# Patient Record
Sex: Female | Born: 1935 | Race: White | Hispanic: No | Marital: Married | Smoking: Former smoker
Health system: Southern US, Community
[De-identification: ages and names within clinical notes are randomized; demographics above are authoritative.]

## PROBLEM LIST (undated history)

## (undated) DIAGNOSIS — G35 Multiple sclerosis: Secondary | ICD-10-CM

## (undated) DIAGNOSIS — M549 Dorsalgia, unspecified: Secondary | ICD-10-CM

## (undated) DIAGNOSIS — H269 Unspecified cataract: Secondary | ICD-10-CM

## (undated) DIAGNOSIS — K649 Unspecified hemorrhoids: Secondary | ICD-10-CM

## (undated) DIAGNOSIS — G8929 Other chronic pain: Secondary | ICD-10-CM

## (undated) DIAGNOSIS — M2540 Effusion, unspecified joint: Secondary | ICD-10-CM

## (undated) DIAGNOSIS — Z9889 Other specified postprocedural states: Secondary | ICD-10-CM

## (undated) DIAGNOSIS — R233 Spontaneous ecchymoses: Secondary | ICD-10-CM

## (undated) DIAGNOSIS — J449 Chronic obstructive pulmonary disease, unspecified: Secondary | ICD-10-CM

## (undated) DIAGNOSIS — Z8619 Personal history of other infectious and parasitic diseases: Secondary | ICD-10-CM

## (undated) DIAGNOSIS — Z8739 Personal history of other diseases of the musculoskeletal system and connective tissue: Secondary | ICD-10-CM

## (undated) DIAGNOSIS — Z86010 Personal history of colonic polyps: Secondary | ICD-10-CM

## (undated) DIAGNOSIS — R0602 Shortness of breath: Secondary | ICD-10-CM

## (undated) DIAGNOSIS — E785 Hyperlipidemia, unspecified: Secondary | ICD-10-CM

## (undated) DIAGNOSIS — M069 Rheumatoid arthritis, unspecified: Secondary | ICD-10-CM

## (undated) DIAGNOSIS — I10 Essential (primary) hypertension: Secondary | ICD-10-CM

## (undated) DIAGNOSIS — R609 Edema, unspecified: Secondary | ICD-10-CM

## (undated) DIAGNOSIS — M2550 Pain in unspecified joint: Secondary | ICD-10-CM

## (undated) DIAGNOSIS — R600 Localized edema: Secondary | ICD-10-CM

## (undated) DIAGNOSIS — J441 Chronic obstructive pulmonary disease with (acute) exacerbation: Secondary | ICD-10-CM

## (undated) DIAGNOSIS — R238 Other skin changes: Secondary | ICD-10-CM

## (undated) DIAGNOSIS — I872 Venous insufficiency (chronic) (peripheral): Secondary | ICD-10-CM

## (undated) DIAGNOSIS — R112 Nausea with vomiting, unspecified: Secondary | ICD-10-CM

## (undated) DIAGNOSIS — F419 Anxiety disorder, unspecified: Secondary | ICD-10-CM

## (undated) DIAGNOSIS — I350 Nonrheumatic aortic (valve) stenosis: Secondary | ICD-10-CM

## (undated) DIAGNOSIS — K219 Gastro-esophageal reflux disease without esophagitis: Secondary | ICD-10-CM

## (undated) DIAGNOSIS — J189 Pneumonia, unspecified organism: Secondary | ICD-10-CM

## (undated) DIAGNOSIS — Z8709 Personal history of other diseases of the respiratory system: Secondary | ICD-10-CM

## (undated) DIAGNOSIS — I480 Paroxysmal atrial fibrillation: Secondary | ICD-10-CM

## (undated) HISTORY — DX: Rheumatoid arthritis, unspecified: M06.9

## (undated) HISTORY — PX: OTHER SURGICAL HISTORY: SHX169

## (undated) HISTORY — DX: Multiple sclerosis: G35

## (undated) HISTORY — PX: COLONOSCOPY: SHX174

## (undated) HISTORY — DX: Nonrheumatic aortic (valve) stenosis: I35.0

## (undated) HISTORY — DX: Essential (primary) hypertension: I10

## (undated) HISTORY — PX: BREAST CYST EXCISION: SHX579

## (undated) HISTORY — DX: Gastro-esophageal reflux disease without esophagitis: K21.9

## (undated) HISTORY — PX: ABDOMINAL HYSTERECTOMY: SHX81

## (undated) HISTORY — DX: Chronic obstructive pulmonary disease, unspecified: J44.9

## (undated) HISTORY — DX: Chronic obstructive pulmonary disease with (acute) exacerbation: J44.1

## (undated) HISTORY — DX: Venous insufficiency (chronic) (peripheral): I87.2

## (undated) HISTORY — DX: Paroxysmal atrial fibrillation: I48.0

---

## 1999-09-18 ENCOUNTER — Encounter (INDEPENDENT_AMBULATORY_CARE_PROVIDER_SITE_OTHER): Payer: Self-pay | Admitting: *Deleted

## 1999-09-18 ENCOUNTER — Ambulatory Visit (HOSPITAL_COMMUNITY): Admission: RE | Admit: 1999-09-18 | Discharge: 1999-09-18 | Payer: Self-pay | Admitting: Gastroenterology

## 2001-09-21 HISTORY — PX: LUMBAR DISC SURGERY: SHX700

## 2002-09-13 ENCOUNTER — Encounter: Payer: Self-pay | Admitting: Neurosurgery

## 2002-09-13 ENCOUNTER — Ambulatory Visit (HOSPITAL_COMMUNITY): Admission: RE | Admit: 2002-09-13 | Discharge: 2002-09-13 | Payer: Self-pay | Admitting: Neurosurgery

## 2002-09-16 ENCOUNTER — Inpatient Hospital Stay (HOSPITAL_COMMUNITY): Admission: AD | Admit: 2002-09-16 | Discharge: 2002-09-17 | Payer: Self-pay | Admitting: Neurosurgery

## 2002-09-16 ENCOUNTER — Encounter: Payer: Self-pay | Admitting: Neurosurgery

## 2003-04-18 ENCOUNTER — Ambulatory Visit (HOSPITAL_COMMUNITY): Admission: RE | Admit: 2003-04-18 | Discharge: 2003-04-18 | Payer: Self-pay | Admitting: Gastroenterology

## 2005-06-10 ENCOUNTER — Ambulatory Visit (HOSPITAL_COMMUNITY): Admission: RE | Admit: 2005-06-10 | Discharge: 2005-06-10 | Payer: Self-pay | Admitting: Neurosurgery

## 2005-12-02 ENCOUNTER — Ambulatory Visit (HOSPITAL_COMMUNITY): Admission: RE | Admit: 2005-12-02 | Discharge: 2005-12-02 | Payer: Self-pay | Admitting: Family Medicine

## 2005-12-07 ENCOUNTER — Ambulatory Visit: Payer: Self-pay | Admitting: Family Medicine

## 2005-12-07 ENCOUNTER — Inpatient Hospital Stay (HOSPITAL_COMMUNITY): Admission: AD | Admit: 2005-12-07 | Discharge: 2005-12-09 | Payer: Self-pay | Admitting: Family Medicine

## 2006-10-25 ENCOUNTER — Ambulatory Visit: Payer: Self-pay | Admitting: Internal Medicine

## 2006-11-22 ENCOUNTER — Ambulatory Visit (HOSPITAL_COMMUNITY): Admission: RE | Admit: 2006-11-22 | Discharge: 2006-11-22 | Payer: Self-pay | Admitting: Family Medicine

## 2006-11-25 ENCOUNTER — Ambulatory Visit: Payer: Self-pay | Admitting: Internal Medicine

## 2006-11-26 ENCOUNTER — Encounter: Admission: RE | Admit: 2006-11-26 | Discharge: 2006-11-26 | Payer: Self-pay | Admitting: Family Medicine

## 2007-03-31 ENCOUNTER — Ambulatory Visit: Payer: Self-pay | Admitting: Internal Medicine

## 2007-04-01 ENCOUNTER — Ambulatory Visit: Payer: Self-pay

## 2007-05-03 ENCOUNTER — Ambulatory Visit: Payer: Self-pay | Admitting: Internal Medicine

## 2007-05-03 LAB — CONVERTED CEMR LAB
Basophils Relative: 0.8 % (ref 0.0–1.0)
Eosinophils Absolute: 0.3 10*3/uL (ref 0.0–0.6)
Hemoglobin: 13.8 g/dL (ref 12.0–15.0)
MCHC: 35.3 g/dL (ref 30.0–36.0)
Monocytes Relative: 9.1 % (ref 3.0–11.0)
Neutro Abs: 4.8 10*3/uL (ref 1.4–7.7)
Neutrophils Relative %: 58.3 % (ref 43.0–77.0)
Platelets: 232 10*3/uL (ref 150–400)
RDW: 13.3 % (ref 11.5–14.6)

## 2007-05-04 ENCOUNTER — Encounter: Payer: Self-pay | Admitting: Internal Medicine

## 2007-05-04 LAB — CONVERTED CEMR LAB: Theophylline Lvl: 10.2 ug/mL (ref 10.0–20.0)

## 2007-05-05 ENCOUNTER — Ambulatory Visit: Payer: Self-pay | Admitting: Internal Medicine

## 2007-06-03 ENCOUNTER — Ambulatory Visit: Payer: Self-pay | Admitting: Vascular Surgery

## 2007-07-01 DIAGNOSIS — J4489 Other specified chronic obstructive pulmonary disease: Secondary | ICD-10-CM | POA: Insufficient documentation

## 2007-07-01 DIAGNOSIS — J449 Chronic obstructive pulmonary disease, unspecified: Secondary | ICD-10-CM

## 2007-07-01 DIAGNOSIS — G35 Multiple sclerosis: Secondary | ICD-10-CM

## 2007-07-01 DIAGNOSIS — I8310 Varicose veins of unspecified lower extremity with inflammation: Secondary | ICD-10-CM | POA: Insufficient documentation

## 2007-07-23 DIAGNOSIS — I480 Paroxysmal atrial fibrillation: Secondary | ICD-10-CM

## 2007-07-23 HISTORY — DX: Paroxysmal atrial fibrillation: I48.0

## 2007-07-29 ENCOUNTER — Ambulatory Visit: Payer: Self-pay | Admitting: Internal Medicine

## 2007-07-29 ENCOUNTER — Inpatient Hospital Stay (HOSPITAL_COMMUNITY): Admission: EM | Admit: 2007-07-29 | Discharge: 2007-08-01 | Payer: Self-pay | Admitting: Emergency Medicine

## 2007-07-29 ENCOUNTER — Ambulatory Visit: Payer: Self-pay | Admitting: Pulmonary Disease

## 2007-07-29 ENCOUNTER — Ambulatory Visit: Payer: Self-pay | Admitting: Cardiology

## 2007-07-31 ENCOUNTER — Encounter: Payer: Self-pay | Admitting: Cardiology

## 2007-08-15 ENCOUNTER — Ambulatory Visit: Payer: Self-pay | Admitting: Cardiovascular Disease

## 2007-09-02 ENCOUNTER — Ambulatory Visit: Payer: Self-pay | Admitting: Internal Medicine

## 2007-09-19 ENCOUNTER — Telehealth: Payer: Self-pay | Admitting: Internal Medicine

## 2007-10-20 ENCOUNTER — Ambulatory Visit: Payer: Self-pay | Admitting: Emergency Medicine

## 2007-10-20 ENCOUNTER — Ambulatory Visit: Payer: Self-pay | Admitting: Internal Medicine

## 2007-10-20 ENCOUNTER — Inpatient Hospital Stay (HOSPITAL_COMMUNITY): Admission: EM | Admit: 2007-10-20 | Discharge: 2007-10-24 | Payer: Self-pay | Admitting: Internal Medicine

## 2007-10-20 DIAGNOSIS — J441 Chronic obstructive pulmonary disease with (acute) exacerbation: Secondary | ICD-10-CM

## 2007-11-10 ENCOUNTER — Telehealth (INDEPENDENT_AMBULATORY_CARE_PROVIDER_SITE_OTHER): Payer: Self-pay | Admitting: *Deleted

## 2007-11-10 ENCOUNTER — Ambulatory Visit: Payer: Self-pay | Admitting: Internal Medicine

## 2007-11-10 DIAGNOSIS — I10 Essential (primary) hypertension: Secondary | ICD-10-CM

## 2007-11-10 DIAGNOSIS — M069 Rheumatoid arthritis, unspecified: Secondary | ICD-10-CM | POA: Insufficient documentation

## 2007-11-11 ENCOUNTER — Ambulatory Visit: Payer: Self-pay | Admitting: Internal Medicine

## 2007-11-23 ENCOUNTER — Telehealth (INDEPENDENT_AMBULATORY_CARE_PROVIDER_SITE_OTHER): Payer: Self-pay | Admitting: *Deleted

## 2007-12-09 ENCOUNTER — Ambulatory Visit: Payer: Self-pay | Admitting: Internal Medicine

## 2007-12-09 DIAGNOSIS — J3089 Other allergic rhinitis: Secondary | ICD-10-CM

## 2007-12-09 DIAGNOSIS — J302 Other seasonal allergic rhinitis: Secondary | ICD-10-CM

## 2008-01-09 ENCOUNTER — Ambulatory Visit: Payer: Self-pay | Admitting: Internal Medicine

## 2008-02-29 ENCOUNTER — Ambulatory Visit: Payer: Self-pay | Admitting: Internal Medicine

## 2008-05-29 ENCOUNTER — Ambulatory Visit: Payer: Self-pay | Admitting: Cardiovascular Disease

## 2008-07-20 ENCOUNTER — Telehealth (INDEPENDENT_AMBULATORY_CARE_PROVIDER_SITE_OTHER): Payer: Self-pay | Admitting: *Deleted

## 2008-08-17 ENCOUNTER — Telehealth (INDEPENDENT_AMBULATORY_CARE_PROVIDER_SITE_OTHER): Payer: Self-pay | Admitting: *Deleted

## 2008-08-30 ENCOUNTER — Ambulatory Visit: Payer: Self-pay | Admitting: Internal Medicine

## 2008-09-18 ENCOUNTER — Telehealth: Payer: Self-pay | Admitting: Internal Medicine

## 2008-09-24 ENCOUNTER — Ambulatory Visit: Payer: Self-pay | Admitting: Internal Medicine

## 2008-09-25 ENCOUNTER — Telehealth (INDEPENDENT_AMBULATORY_CARE_PROVIDER_SITE_OTHER): Payer: Self-pay | Admitting: *Deleted

## 2008-09-27 ENCOUNTER — Telehealth (INDEPENDENT_AMBULATORY_CARE_PROVIDER_SITE_OTHER): Payer: Self-pay | Admitting: *Deleted

## 2008-09-27 ENCOUNTER — Encounter: Payer: Self-pay | Admitting: Internal Medicine

## 2008-10-01 ENCOUNTER — Encounter: Payer: Self-pay | Admitting: Internal Medicine

## 2008-10-02 ENCOUNTER — Telehealth: Payer: Self-pay | Admitting: Internal Medicine

## 2008-10-04 ENCOUNTER — Encounter: Payer: Self-pay | Admitting: Internal Medicine

## 2008-10-15 ENCOUNTER — Encounter: Payer: Self-pay | Admitting: Internal Medicine

## 2008-10-29 ENCOUNTER — Ambulatory Visit: Payer: Self-pay | Admitting: Internal Medicine

## 2008-12-28 ENCOUNTER — Encounter: Payer: Self-pay | Admitting: Internal Medicine

## 2009-02-07 ENCOUNTER — Ambulatory Visit: Payer: Self-pay | Admitting: Internal Medicine

## 2009-02-26 ENCOUNTER — Ambulatory Visit: Payer: Self-pay | Admitting: Internal Medicine

## 2009-03-21 ENCOUNTER — Ambulatory Visit: Payer: Self-pay | Admitting: Internal Medicine

## 2009-04-29 ENCOUNTER — Ambulatory Visit: Payer: Self-pay | Admitting: Internal Medicine

## 2009-05-02 ENCOUNTER — Telehealth: Payer: Self-pay | Admitting: Internal Medicine

## 2009-06-04 DIAGNOSIS — I4891 Unspecified atrial fibrillation: Secondary | ICD-10-CM

## 2009-06-04 DIAGNOSIS — L259 Unspecified contact dermatitis, unspecified cause: Secondary | ICD-10-CM

## 2009-06-04 DIAGNOSIS — F4321 Adjustment disorder with depressed mood: Secondary | ICD-10-CM | POA: Insufficient documentation

## 2009-06-04 DIAGNOSIS — I350 Nonrheumatic aortic (valve) stenosis: Secondary | ICD-10-CM

## 2009-06-04 DIAGNOSIS — K219 Gastro-esophageal reflux disease without esophagitis: Secondary | ICD-10-CM | POA: Insufficient documentation

## 2009-06-05 ENCOUNTER — Ambulatory Visit: Payer: Self-pay | Admitting: Cardiovascular Disease

## 2009-06-05 DIAGNOSIS — R0602 Shortness of breath: Secondary | ICD-10-CM | POA: Insufficient documentation

## 2009-06-07 ENCOUNTER — Ambulatory Visit: Payer: Self-pay | Admitting: Internal Medicine

## 2009-06-18 ENCOUNTER — Ambulatory Visit: Payer: Self-pay

## 2009-06-18 ENCOUNTER — Encounter: Payer: Self-pay | Admitting: Cardiovascular Disease

## 2009-08-27 ENCOUNTER — Ambulatory Visit: Payer: Self-pay | Admitting: Internal Medicine

## 2009-09-16 ENCOUNTER — Ambulatory Visit: Payer: Self-pay | Admitting: Internal Medicine

## 2009-09-17 ENCOUNTER — Telehealth: Payer: Self-pay | Admitting: Internal Medicine

## 2009-09-17 ENCOUNTER — Inpatient Hospital Stay (HOSPITAL_COMMUNITY): Admission: EM | Admit: 2009-09-17 | Discharge: 2009-09-23 | Payer: Self-pay | Admitting: Emergency Medicine

## 2009-09-17 ENCOUNTER — Ambulatory Visit: Payer: Self-pay | Admitting: Internal Medicine

## 2009-09-21 ENCOUNTER — Ambulatory Visit: Payer: Self-pay | Admitting: Internal Medicine

## 2009-09-30 ENCOUNTER — Telehealth (INDEPENDENT_AMBULATORY_CARE_PROVIDER_SITE_OTHER): Payer: Self-pay | Admitting: *Deleted

## 2009-10-22 ENCOUNTER — Ambulatory Visit: Payer: Self-pay | Admitting: Internal Medicine

## 2009-10-22 DIAGNOSIS — B009 Herpesviral infection, unspecified: Secondary | ICD-10-CM | POA: Insufficient documentation

## 2009-11-06 ENCOUNTER — Encounter: Payer: Self-pay | Admitting: Pulmonary Disease

## 2009-11-07 ENCOUNTER — Inpatient Hospital Stay (HOSPITAL_COMMUNITY): Admission: EM | Admit: 2009-11-07 | Discharge: 2009-11-08 | Payer: Self-pay | Admitting: Emergency Medicine

## 2009-11-08 ENCOUNTER — Encounter: Payer: Self-pay | Admitting: Internal Medicine

## 2009-11-25 ENCOUNTER — Ambulatory Visit: Payer: Self-pay | Admitting: Cardiovascular Disease

## 2009-12-04 ENCOUNTER — Ambulatory Visit: Payer: Self-pay | Admitting: Internal Medicine

## 2010-01-09 ENCOUNTER — Telehealth: Payer: Self-pay | Admitting: Internal Medicine

## 2010-01-20 ENCOUNTER — Ambulatory Visit: Payer: Self-pay | Admitting: Internal Medicine

## 2010-01-21 ENCOUNTER — Encounter: Admission: RE | Admit: 2010-01-21 | Discharge: 2010-01-21 | Payer: Self-pay | Admitting: Internal Medicine

## 2010-03-31 ENCOUNTER — Telehealth (INDEPENDENT_AMBULATORY_CARE_PROVIDER_SITE_OTHER): Payer: Self-pay | Admitting: *Deleted

## 2010-03-31 ENCOUNTER — Ambulatory Visit: Payer: Self-pay | Admitting: Internal Medicine

## 2010-04-04 ENCOUNTER — Inpatient Hospital Stay (HOSPITAL_COMMUNITY): Admission: EM | Admit: 2010-04-04 | Discharge: 2010-04-11 | Payer: Self-pay | Admitting: Emergency Medicine

## 2010-04-04 ENCOUNTER — Ambulatory Visit: Payer: Self-pay | Admitting: Internal Medicine

## 2010-04-11 ENCOUNTER — Telehealth (INDEPENDENT_AMBULATORY_CARE_PROVIDER_SITE_OTHER): Payer: Self-pay | Admitting: *Deleted

## 2010-04-21 ENCOUNTER — Ambulatory Visit: Payer: Self-pay | Admitting: Internal Medicine

## 2010-04-22 ENCOUNTER — Encounter: Payer: Self-pay | Admitting: Internal Medicine

## 2010-05-06 ENCOUNTER — Ambulatory Visit: Payer: Self-pay | Admitting: Internal Medicine

## 2010-05-06 ENCOUNTER — Telehealth (INDEPENDENT_AMBULATORY_CARE_PROVIDER_SITE_OTHER): Payer: Self-pay | Admitting: *Deleted

## 2010-05-20 ENCOUNTER — Telehealth (INDEPENDENT_AMBULATORY_CARE_PROVIDER_SITE_OTHER): Payer: Self-pay | Admitting: *Deleted

## 2010-05-23 ENCOUNTER — Ambulatory Visit: Payer: Self-pay | Admitting: Internal Medicine

## 2010-05-28 LAB — CONVERTED CEMR LAB: IgE (Immunoglobulin E), Serum: <1.5 intl units/mL (ref 0.0–180.0)

## 2010-06-13 ENCOUNTER — Telehealth (INDEPENDENT_AMBULATORY_CARE_PROVIDER_SITE_OTHER): Payer: Self-pay | Admitting: *Deleted

## 2010-06-26 ENCOUNTER — Telehealth (INDEPENDENT_AMBULATORY_CARE_PROVIDER_SITE_OTHER): Payer: Self-pay | Admitting: *Deleted

## 2010-06-27 ENCOUNTER — Encounter: Payer: Self-pay | Admitting: Internal Medicine

## 2010-06-27 ENCOUNTER — Ambulatory Visit: Payer: Self-pay | Admitting: Internal Medicine

## 2010-06-27 DIAGNOSIS — I498 Other specified cardiac arrhythmias: Secondary | ICD-10-CM | POA: Insufficient documentation

## 2010-07-22 ENCOUNTER — Ambulatory Visit: Payer: Self-pay | Admitting: Internal Medicine

## 2010-07-31 ENCOUNTER — Encounter: Payer: Self-pay | Admitting: Internal Medicine

## 2010-08-10 ENCOUNTER — Encounter: Payer: Self-pay | Admitting: Internal Medicine

## 2010-09-01 ENCOUNTER — Telehealth: Payer: Self-pay | Admitting: Internal Medicine

## 2010-09-29 ENCOUNTER — Telehealth (INDEPENDENT_AMBULATORY_CARE_PROVIDER_SITE_OTHER): Payer: Self-pay | Admitting: *Deleted

## 2010-09-29 ENCOUNTER — Ambulatory Visit
Admission: RE | Admit: 2010-09-29 | Discharge: 2010-09-29 | Payer: Self-pay | Source: Home / Self Care | Attending: Internal Medicine | Admitting: Internal Medicine

## 2010-10-06 ENCOUNTER — Telehealth (INDEPENDENT_AMBULATORY_CARE_PROVIDER_SITE_OTHER): Payer: Self-pay | Admitting: *Deleted

## 2010-10-20 ENCOUNTER — Ambulatory Visit
Admission: RE | Admit: 2010-10-20 | Discharge: 2010-10-20 | Payer: Self-pay | Source: Home / Self Care | Attending: Internal Medicine | Admitting: Internal Medicine

## 2010-10-21 NOTE — Assessment & Plan Note (Signed)
Summary: rov 3 months/// kp   Primary Provider/Referring Provider:  Duffy Rhody  CC:  3 month follow up visit-COPD; cough-non productive, slight wheezing, and SOB.Marland Kitchen  History of Present Illness:  May 23, 2010- Chronic obstructive asthma, PAF, RA Cough finally resolved as she finished antibiotic Levaquin from last visit. Now clear almost 2 weeks. Insurance wouldn't cover Daliresp. She isn't sure whether it was helping or not. We discussed it and she decided she wanted to self pay and try it another month.  June 27, 2010  Chronic obstructive asthma, PAF, RA cc: Acute Visit-SOB, dizziness, tightness in chest. Note tachycardia on arrival 129/min. We had called biaxin and prednisone for productive cough and finishing that she felt very well. Went to [employer] with sister. On return began feeling weak and aware of rapid pulse with no chest pain. Head stopped up lying in bed with oxygen last night. Denies any tightness, sweling or aching in legs.  Chest feels tight with no cough or phlegm. Denies blood, fever, chills, sore throat, chest pain, leg pain or swelling. EKG- sinus tach 109. Pulmonary disease pattern.  July 22, 2010- Chronic obstructive asthma, PAF, RA,  .Nurse CC: 3 month follow up visit-COPD; cough-non productive, slight wheezing, SOB. Feeling more wheeze in the last 2 days, attributed to the weather change. "Just feel bad". Pains along lateral aspect of right uper and lower leg limit walking when she first gets up. Had surgery before for similar nerve pain in the other leg. I asked her to contact Dr Marina Goodell for this, as her PCP.  She thinks Daliresp has helped some and asks more samples. Denies significant GI upset.   Asthma History    Asthma Control Assessment:    Age range: 12+ years    Symptoms: >2 days/week    Nighttime Awakenings: 0-2/month    Interferes w/ normal activity: some limitations    SABA use (not for EIB): >2 days/week    Asthma Control Assessment: Not  Well Controlled   Preventive Screening-Counseling & Management  Alcohol-Tobacco     Smoking Status: quit > 6 months     Smoking Cessation Counseling: yes     Packs/Day: 2.0     Year Started: 1956     Year Quit: 1991     Pack years: 30 yrs  1 pack daily     Tobacco Counseling: not to resume use of tobacco products  Current Medications (verified): 1)  Advair Diskus 250-50 Mcg/dose  Misc (Fluticasone-Salmeterol) .... Inhale 1 Puff Two Times A Day 2)  Proair Hfa 108 (90 Base) Mcg/act  Aers (Albuterol Sulfate) .... 2 Puffs 4 Times Daily If Needed 3)  Mucinex Dm Maximum Strength 60-1200 Mg  Tb12 (Dextromethorphan-Guaifenesin) .... Take 1 Tablet By Mouth Two Times A Day 4)  Hydrochlorothiazide 25 Mg  Tabs (Hydrochlorothiazide) .Marland Kitchen.. 1 Tab By Mouth Once Daily 5)  Lexapro 10 Mg  Tabs (Escitalopram Oxalate) .... 1/2 Tab By Mouth Once Daily 6)  Protonix 40 Mg Tbec (Pantoprazole Sodium) .... Take 1 By Mouth Once Daily 7)  Xopenex 1.25 Mg/46ml Nebu (Levalbuterol Hcl) .Marland Kitchen.. 1 Vial Via [employer] Two Times A Day 8)  Losartan Potassium 25 Mg Tabs (Losartan Potassium) .... Take 1 Tablet By Mouth Once A Day 9)  Daliresp 500 Mcg .Marland KitchenMarland Kitchen. 1 Daily 10)  Oxygen .... Wear 2l/min As Needed With Activity and At Bedtime  Allergies (verified): 1)  ! * Diltiazem 2)  ! * Singulair  Past History:  Past Medical History: Last updated:  04/04/2010 DERMATITIS (ICD-692.9) GERD (ICD-530.81) DEPRESSION (ICD-311) AORTIC STENOSIS (ICD-424.1) mild by echo 2008 PAROXYSMAL ATRIAL FIBRILLATION (ICD-427.31) - 11/08 ALLERGIC RHINITIS (ICD-477.9) RHEUMATOID ARTHRITIS (ICD-714.0)--chronic low dose prednisone at 5mg  once daily  HYPERTENSION (ICD-401.9) BRONCHITIS, OBSTRUCTIVE CHRONIC, ACUTE EXACERBATION (ICD-491.21) COLON CANCER, FAMILY HISTORY (ICD-V16.0) STASIS DERMATITIS (ICD-454.1) MULTIPLE SCLEROSIS (ICD-340) COPD (ICD-496) ASTHMA, CHRONIC OBSTRUCTIVE NOS (ICD-493.20) PFT  severe COPD--7/10>>FEV1 39%, ratio of 31    Hypertension Rheumatoid Arthritis  Acute urinary infection not otherwise specified.   Past Surgical History: Last updated: 06/04/2009 Hysterectomy Breast cyst  L5 diskectomy in 2003.   Family History: Last updated: 09/13/2009 Mother- deceased age 5;rheumatism, asthma Father- deceased age 57; emphysema 44 Brothers and Sisters- 1 with colon cancer and 1 deceased with heart trouble.  Brother- died Alzheimers  Social History: Last updated: 02/29/2008 Patient states former smoker.  Quit in 1991. Cares for husband (demented) Married with 2 children.  Risk Factors: Smoking Status: quit > 6 months (07/22/2010) Packs/Day: 2.0 (07/22/2010)  Review of Systems      See HPI       The patient complains of shortness of breath with activity and non-productive cough.  The patient denies shortness of breath at rest, productive cough, coughing up blood, chest pain, irregular heartbeats, acid heartburn, indigestion, loss of appetite, weight change, abdominal pain, difficulty swallowing, sore throat, tooth/dental problems, headaches, nasal congestion/difficulty breathing through nose, sneezing, itching, change in color of mucus, and fever.    Vital Signs:  Patient profile:   77 year old female Height:      63 inches Weight:      141 pounds BMI:     25.07 O2 Sat:      92 % on Room air Pulse rate:   98 / minute BP sitting:   122 / 78  (left arm) Cuff size:   regular  Vitals Entered By: Reynaldo Minium CMA (July 22, 2010 10:40 AM)  O2 Flow:  Room air CC: 3 month follow up visit-COPD; cough-non productive, slight wheezing, SOB.   Physical Exam  Additional Exam:  General: A/Ox3; pleasant in NAD, SKIN: clear w/ no rash.  NODES: no lymphadenopathy noted.  HEENT: [place]/AT, EOM- WNL, Conjuctivae- clear, PERRLA, TM, clear left, , Nose- edema and blowing, Throat- clear and wnl- not red,  Mallampati II NECK: Supple w/ fair ROM, JVD- none, normal carotid impulses w/o bruits Thyroid-  CHEST  No wheeze, dullness or rhonchi heard HEART: rapid regular pulse with no murmur or rub ABDOMEN: soft ZHY:QMVH, nl pulses, no edema . Thin calves, negative Homan's, no cyanosis or clubbing NEURO: Grossly intact to observation      Impression & Recommendations:  Problem # 1:  ASTHMA, CHRONIC OBSTRUCTIVE NOS (ICD-493.20) She is starting to tighten a little by description, not evident yet on exam. She is quite liable to progress rapidly once started. We discussed options. We will give depo today and a script for prednisone taper to hold.   Problem # 2:  OTHER SPECIFIED CARDIAC DYSRHYTHMIAS (ICD-427.89) Sinus tachycardia last time, not evident today.   Medications Added to Medication List This Visit: 1)  Prednisone 10 Mg Tabs (Prednisone) .Marland Kitchen.. 1 tab four times daily x 2 days, 3 times daily x 2 days, 2 times daily x 2 days, 1 time daily x 2 days  Other Orders: Est. Patient Level IV (84696) Admin of Therapeutic Inj  intramuscular or subcutaneous (29528) Depo- Medrol 80mg  (J1040)  Patient Instructions: 1)  Please schedule a follow-up appointment in 3 months. 2)  depo  80 3)  script for prednisone taper to hold 4)  cc Dr Marina Goodell Prescriptions: PREDNISONE 10 MG TABS (PREDNISONE) 1 tab four times daily x 2 days, 3 times daily x 2 days, 2 times daily x 2 days, 1 time daily x 2 days  #20 x 1   Entered and Authorized by:   Waymon Budge MD   Signed by:   Waymon Budge MD on 07/22/2010   Method used:   Print then Give to Patient   RxID:   [id number]      Medication Administration  Injection # 1:    Medication: Depo- Medrol 80mg     Diagnosis: ASTHMA, CHRONIC OBSTRUCTIVE NOS (ICD-493.20)    Route: SQ    Site: RUOQ gluteus    Exp Date: 02/2013    Lot #: [id number]    Mfr: Pharmacia    Patient tolerated injection without complications    Given by: Reynaldo Minium CMA (July 22, 2010 11:31 AM)  Orders Added: 1)  Est. Patient Level IV [95621] 2)  Admin of Therapeutic Inj   intramuscular or subcutaneous [96372] 3)  Depo- Medrol 80mg  [J1040]

## 2010-10-21 NOTE — Miscellaneous (Signed)
Summary: sick call  Clinical Lists Changes  pt called with onset of severe sob this afternoon, tightness in chest.  She sounded very tachypneic on telephone, unable to complete sentences.  I asked her to call ems to take her to ER asap.

## 2010-10-21 NOTE — Assessment & Plan Note (Signed)
Summary: accute sick visit/kcw   Primary Provider/Referring Provider:  Duffy Rhody  CC:  coughing with yellow production sob.wheezing and chest tight.  History of Present Illness:  October 22, 2009- Severe asthma, COPD, PAF, AS, RA Hosp late December for exacerb COPD which cleared only slowly. She feels back to baseline finally. RUQ pain eval by Dr Marina Goodell w/ G B scan "gas". Currently no wheeze or cough. Some exertional dyspnea. Taking an antiviral pill for viral "fever blisters" on lips.  December 04, 2009- Severe asthma, COPD, PAF, Asthma, RA Hospital follow-up after admitted 2/16-18/11 with very rapid onset exacerbation. We treated with asthma without being clear if she might have aspirated or some other mechanism. Since then has been home and well.. Dr Eden Emms has seen for cardiac f/u saying she was doing ok. She has tapered prednisone back down to 1/2 x 10 mg = 5 mg daily. She hadn't been using Advair since she got home. Denies coughing or blowing anything now.  Jan 20, 2010- Severe asthma, COPD, PAF, ASthma, RA Since last here she found knots in her legs, and Dr Marina Goodell dx'd phlebitis. He didn't like her breath sounds, got CXR showing upper right lung pneumonia- done at his office. He treated with Levaquin March 31. He also gave her prednisone. Meanwhile her husband had CVA.  Today her breathing is good for her. She was able to work outdoors all day yesterday.  March 31, 2010- Severe asthma, COPD, PAF, Asthma, RA Family had colds. 2 days ago she woke tight , weak and dyspneic. Cough productive thick yellow. sore throat, tussive headache. denies nausea. Yesterday she took 30 mg prednisone. Prior she had remained on 5 mg daily. Denies obvious fever, GI upset. Arrived here today with room air sat 66%, up to 91% on 4L.    Asthma History    Initial Asthma Severity Rating:    Age range: 12+ years    Symptoms: throughout the day    Nighttime Awakenings: >1/week but not nightly    Interferes  w/ normal activity: extreme limitations    SABA use (not for EIB): several times per day    Asthma Severity Assessment: Severe Persistent   Preventive Screening-Counseling & Management  Alcohol-Tobacco     Smoking Status: quit     Year Quit: 20 years ago  Current Medications (verified): 1)  Advair Diskus 250-50 Mcg/dose  Misc (Fluticasone-Salmeterol) .... Inhale 1 Puff Two Times A Day 2)  Proair Hfa 108 (90 Base) Mcg/act  Aers (Albuterol Sulfate) .... 2 Puffs 4 Times Daily If Needed 3)  Prednisone 5 Mg Tabs (Prednisone) .... 3-6 Tabs Daily When Needed. 4)  Mucinex Dm Maximum Strength 60-1200 Mg  Tb12 (Dextromethorphan-Guaifenesin) .... Take 1 Tablet By Mouth Two Times A Day 5)  Hydrochlorothiazide 25 Mg  Tabs (Hydrochlorothiazide) .Marland Kitchen.. 1 Tab By Mouth Once Daily 6)  Lexapro 10 Mg  Tabs (Escitalopram Oxalate) .... 1/2 Tab By Mouth Once Daily 7)  Ramipril 5 Mg Caps (Ramipril) .... Take 1 By Mouth Once Daily 8)  Vitamin D 16109 Unit Caps (Ergocalciferol) .... Take 1 By Mouth Weekly 9)  Protonix 40 Mg Tbec (Pantoprazole Sodium) .... Take 1 By Mouth Once Daily 10)  Xopenex 1.25 Mg/81ml Nebu (Levalbuterol Hcl) .Marland Kitchen.. 1 Vial Via [employer] Two Times A Day  Allergies: 1)  ! * Diltiazem 2)  ! * Singulair  Past History:  Past Medical History: Last updated: 06/07/2009 DERMATITIS (ICD-692.9) GERD (ICD-530.81) DEPRESSION (ICD-311) AORTIC STENOSIS (ICD-424.1) mild by echo 2008 PAROXYSMAL  ATRIAL FIBRILLATION (ICD-427.31) - 11/08 ALLERGIC RHINITIS (ICD-477.9) RHEUMATOID ARTHRITIS (ICD-714.0) HYPERTENSION (ICD-401.9) BRONCHITIS, OBSTRUCTIVE CHRONIC, ACUTE EXACERBATION (ICD-491.21) COLON CANCER, FAMILY HISTORY (ICD-V16.0) STASIS DERMATITIS (ICD-454.1) MULTIPLE SCLEROSIS (ICD-340) COPD (ICD-496) ASTHMA, CHRONIC OBSTRUCTIVE NOS (ICD-493.20) PFT 11/25/06 severe COPD Hypertension Rheumatoid Arthritis  Acute urinary infection not otherwise specified.   Past Surgical History: Last updated:  06/04/2009 Hysterectomy Breast cyst  L5 diskectomy in 2003.   Family History: Last updated: 08/31/09 Mother- deceased age 31;rheumatism, asthma Father- deceased age 27; emphysema 61 Brothers and Sisters- 1 with colon cancer and 1 deceased with heart trouble.  Brother- died Alzheimers  Social History: Last updated: 02/29/2008 Patient states former smoker.  Quit in 1991. Cares for husband (demented) Married with 2 children.  Risk Factors: Smoking Status: quit (03/31/2010)  Review of Systems      See HPI       The patient complains of shortness of breath with activity, shortness of breath at rest, productive cough, non-productive cough, and headaches.  The patient denies coughing up blood, chest pain, irregular heartbeats, acid heartburn, indigestion, loss of appetite, weight change, abdominal pain, difficulty swallowing, sore throat, tooth/dental problems, nasal congestion/difficulty breathing through nose, and sneezing.    Vital Signs:  Patient profile:   77 year old female Height:      63 inches Weight:      145 pounds BMI:     25.78 O2 Sat:      66 % on Room air Temp:     99.4 degrees F oral Pulse rate:   122 / minute BP sitting:   140 / 80  (right arm) Cuff size:   regular  Vitals Entered By: Kandice Hams CMA (March 31, 2010 10:14 AM)  O2 Flow:  Room air CC: coughing with yellow production sob.wheezing, chest tight   Physical Exam  Additional Exam:  General: A/Ox3; pleasant and cooperative, NAD, On our supplemental O2 at 4 L SKIN: sfaint viral type lesion upper lip NODES: no lymphadenopathy HEENT: [place]/AT, EOM- WNL, Conjuctivae- clear, PERRLA, TM, clear left, , Nose- edema and blowing, Throat- clear and wnl- not red,  Mallampati II NECK: Supple w/ fair ROM, JVD- none, normal carotid impulses w/o bruits Thyroid-  CHEST: MIldly labored. Diffuse musical wheeze. Able to talk in full sentences. HEART: RRR, no m/g/r heard ABDOMEN: soft ZOX:WRUE, nl pulses, no edema  . Thin calves. NEURO: Grossly intact to observation      Impression & Recommendations:  Problem # 1:  BRONCHITIS, OBSTRUCTIVE CHRONIC, ACUTE EXACERBATION (ICD-491.21)  If we can get enough meds into her, we may be able to avoid hospital. This seems to be a viral episode, perhaps aggravated by air quality in a very labile, steroid dependent asthmatic. She knwos what to do and what the options are.  Medications Added to Medication List This Visit: 1)  Prednisone 10 Mg Tabs (Prednisone) .Marland Kitchen.. 1 tab four times daily x 2 days, 3 times daily x 2 days, 2 times daily x 2 days, 1 time daily x 2 days  Other Orders: Est. Patient Level IV (45409) Depo- Medrol 40mg  (J1030) Depo- Medrol 80mg  (J1040) Admin of Therapeutic Inj  intramuscular or subcutaneous (81191) Nebulizer Tx (47829)  Patient Instructions: 1)  Please schedule a follow-up appointment in 1 month. 2)  Go to the hospital if you have to. 3)  Neb xop 1.25 4)  Depo 120 mg 5)  Prednisone taper script- back down as tolerated to 5 mg daily. Prescriptions: PREDNISONE 10 MG TABS (PREDNISONE) 1 tab four times  daily x 2 days, 3 times daily x 2 days, 2 times daily x 2 days, 1 time daily x 2 days  #20 x 1   Entered and Authorized by:   Waymon Budge MD   Signed by:   Waymon Budge MD on 03/31/2010   Method used:   Print then Give to Patient   RxID:   [id number]    Medication Administration  Injection # 3:    Medication: Depo- Medrol 40mg     Diagnosis: BRONCHITIS, OBSTRUCTIVE CHRONIC, ACUTE EXACERBATION (ICD-491.21)    Route: IM    Site: RUOQ gluteus    Exp Date: 04/18/2010    Lot #: [id number]    Mfr: [employer]acia    Patient tolerated injection without complications    Given by: Kandice Hams CMA (March 31, 2010 11:53 AM)  Injection # 4:    Medication: Depo- Medrol 80mg     Diagnosis: BRONCHITIS, OBSTRUCTIVE CHRONIC, ACUTE EXACERBATION (ICD-491.21)    Route: IM    Site: RUOQ gluteus    Exp Date: 04/18/2010    Lot #: [id number]     Mfr: [employer]    Patient tolerated injection without complications    Given by: Kandice Hams CMA (March 31, 2010 11:54 AM)  Medication # 1:    Medication: Xopenex 1.25mg     Dose: 1 vial    Route: inhaled    Exp Date: 04/22/2011    Lot #: [id number]    Mfr: [email or web address]    Patient tolerated medication without complications    Given by: Kandice Hams CMA (March 31, 2010 12:02 PM)  Orders Added: 1)  Est. Patient Level IV [14782] 2)  Depo- Medrol 40mg  [J1030] 3)  Depo- Medrol 80mg  [J1040] 4)  Admin of Therapeutic Inj  intramuscular or subcutaneous [96372] 5)  Nebulizer Tx [95621]

## 2010-10-21 NOTE — Assessment & Plan Note (Signed)
Summary: HFU PER BRANDI///KP   Primary Provider/Referring Provider:  Duffy Rhody  CC:  [facility].  History of Present Illness: History of Present Illness: 06/07/09- Severe asthma, COPD, PAF, AS, RA 5-6 days increased wheeze, tired, sneeze and rhinorhea, tussive frontal headache. Denies fever, bloody or purulent. had pneumovx sometime in past.  She saw Dr Eden Emms two days ago, and he recognized  that her breathing was tight and made this appointment. I pointed out that her delay seeking help for her asthma symptoms was part of why she gets in trouble.  Sep 10, 2009- Severe asthma, COPD, PAF, AS, RA Was tighter last week. Only in last day has she begun coughing up some dark mucus. Had flu vax.  She took Z pak 1-2 months ago. She alternates prednisone 5 mg with 1/2 x 5mg  every other day. She ran out of Advair yesterday. Some postnasal drip, but not fever or nodes. Sinus pressure.  September 16, 2009- Severe asthma, COPD, PAF, AS, RA Bad cold with gastroenteritis started 2 weeks ago. GI improved. Then moved worse into chest. Took standby script for antibiotic - big help while on it. Fi9nished first round 3-4 days ago. Now back sliding She took prednisone 30 mg this AM, prior was taking 1/2 x 5 mg daily.  October 22, 2009- Severe asthma, COPD, PAF, AS, RA Hosp late December for exacerb COPD which cleared only slowly. She feels back to baseline finally. RUQ pain eval by Dr Marina Goodell w/ G B scan "gas". Currenly no wheeze or cough. Some exertional dyspnea. Taking an antiviral pill for viral "fever blisters" on lips.    Current Medications (verified): 1)  Advair Diskus 250-50 Mcg/dose  Misc (Fluticasone-Salmeterol) .... Inhale 1 Puff Two Times A Day 2)  Proair Hfa 108 (90 Base) Mcg/act  Aers (Albuterol Sulfate) .... 2 Puffs 4 Times Daily If Needed 3)  Prednisone 5 Mg Tabs (Prednisone) .... 3-6 Tabs Daily When Needed. 4)  Mucinex Dm Maximum Strength 60-1200 Mg  Tb12  (Dextromethorphan-Guaifenesin) .... Take 1 Tablet By Mouth Two Times A Day 5)  Hydrochlorothiazide 25 Mg  Tabs (Hydrochlorothiazide) .Marland Kitchen.. 1 Tab By Mouth Once Daily 6)  Lexapro 10 Mg  Tabs (Escitalopram Oxalate) .... 1/2 Tab By Mouth Once Daily 7)  Ramipril 5 Mg Caps (Ramipril) .... Take 1 By Mouth Once Daily 8)  Vitamin D 60109 Unit Caps (Ergocalciferol) .... Take 1 By Mouth Weekly 9)  Tussionex Pennkinetic Er 8-10 Mg/60ml Lqcr (Chlorpheniramine-Hydrocodone) .Marland Kitchen.. 1 Teaspoon Two Times A Day As Needed Cough 10)  Protonix 40 Mg Tbec (Pantoprazole Sodium) .... Take 1 By Mouth Once Daily 11)  Xopenex 1.25 Mg/87ml Nebu (Levalbuterol Hcl) .Marland Kitchen.. 1 Vial Via [employer] Two Times A Day  Allergies (verified): 1)  ! * Diltiazem 2)  ! * Singulair  Past History:  Past Medical History: Last updated: 06/07/2009 DERMATITIS (ICD-692.9) GERD (ICD-530.81) DEPRESSION (ICD-311) AORTIC STENOSIS (ICD-424.1) mild by echo 2008 PAROXYSMAL ATRIAL FIBRILLATION (ICD-427.31) - 11/08 ALLERGIC RHINITIS (ICD-477.9) RHEUMATOID ARTHRITIS (ICD-714.0) HYPERTENSION (ICD-401.9) BRONCHITIS, OBSTRUCTIVE CHRONIC, ACUTE EXACERBATION (ICD-491.21) COLON CANCER, FAMILY HISTORY (ICD-V16.0) STASIS DERMATITIS (ICD-454.1) MULTIPLE SCLEROSIS (ICD-340) COPD (ICD-496) ASTHMA, CHRONIC OBSTRUCTIVE NOS (ICD-493.20) PFT 11/25/06 severe COPD Hypertension Rheumatoid Arthritis  Acute urinary infection not otherwise specified.   Past Surgical History: Last updated: 06/04/2009 Hysterectomy Breast cyst  L5 diskectomy in 2003.   Family History: Last updated: 09/10/09 Mother- deceased age 26;rheumatism, asthma Father- deceased age 49; emphysema 59 Brothers and Sisters- 1 with colon cancer and 1 deceased with heart trouble.  Brother- died Alzheimers  Social History: Last updated: 02/29/2008 Patient states former smoker.  Quit in 1991. Cares for husband (demented) Married with 2 children.  Risk Factors: Smoking Status: quit  (02/29/2008)  Review of Systems      See HPI       The patient complains of dyspnea on exertion.  The patient denies anorexia, fever, weight loss, weight gain, vision loss, decreased hearing, hoarseness, chest pain, syncope, peripheral edema, prolonged cough, headaches, hemoptysis, and severe indigestion/heartburn.    Vital Signs:  Patient profile:   77 year old female Height:      63 inches Weight:      153.25 pounds BMI:     27.25 O2 Sat:      91 % on Room air Pulse rate:   87 / minute BP sitting:   118 / 70  (left arm) Cuff size:   regular  Vitals Entered By: Reynaldo Minium CMA (October 22, 2009 2:50 PM)  O2 Flow:  Room air  Physical Exam  Additional Exam:  General: A/Ox3; pleasant and cooperative, NAD, SKIN: sfaint viral type lesion upper lip NODES: no lymphadenopathy HEENT: [place]/AT, EOM- WNL, Conjuctivae- clear, PERRLA, TM, clear left, , Nose- edema and blowing, Throat- clear and wnl, Melampatti II NECK: Supple w/ fair ROM, JVD- none, normal carotid impulses w/o bruits Thyroid-  CHEST: Distant without wheeze, unlabored HEART: RRR, no m/g/r heard ABDOMEN: soft ZOX:WRUE, nl pulses, no edema  NEURO: Grossly intact to observation      Impression & Recommendations:  Problem # 1:  BRONCHITIS, OBSTRUCTIVE CHRONIC, ACUTE EXACERBATION (ICD-491.21)  Recent hosp for difficult exacerbation is now back to her baseline with a fixed COPD component. She is now taking maintenance prednisone at 5 mg daily.  Problem # 2:  COLD SORE (ICD-054.9) We will give antiviral topical.  Medications Added to Medication List This Visit: 1)  Hydrochlorothiazide 25 Mg Tabs (Hydrochlorothiazide) .Marland Kitchen.. 1 tab by mouth once daily 2)  Protonix 40 Mg Tbec (Pantoprazole sodium) .... Take 1 by mouth once daily 3)  Xopenex 1.25 Mg/32ml Nebu (Levalbuterol hcl) .Marland Kitchen.. 1 vial via neb two times a day 4)  Zovirax 5 % Oint (Acyclovir) .... Apply to affected area three times a day  Other Orders: Est. Patient  Level III (45409)  Patient Instructions: 1)  Please schedule a follow-up appointment in 3 months. 2)  Script for antiviral ointment  Prescriptions: ZOVIRAX 5 % OINT (ACYCLOVIR) Apply to affected area three times a day  #1 tube x prn   Entered and Authorized by:   Waymon Budge MD   Signed by:   Waymon Budge MD on 10/22/2009   Method used:   Print then Give to Patient   RxID:   [id number]

## 2010-10-21 NOTE — Assessment & Plan Note (Signed)
Summary: 3 months/apc   Primary Provider/Referring Provider:  Duffy Rhody  CC:  3 month follow up visit .  History of Present Illness: September 16, 2009- Severe asthma, COPD, PAF, AS, RA Bad cold with gastroenteritis started 2 weeks ago. GI improved. Then moved worse into chest. Took standby script for antibiotic - big help while on it. Fi9nished first round 3-4 days ago. Now back sliding She took prednisone 30 mg this AM, prior was taking 1/2 x 5 mg daily.  October 22, 2009- Severe asthma, COPD, PAF, AS, RA Hosp late December for exacerb COPD which cleared only slowly. She feels back to baseline finally. RUQ pain eval by Dr Marina Goodell w/ G B scan "gas". Currently no wheeze or cough. Some exertional dyspnea. Taking an antiviral pill for viral "fever blisters" on lips.  December 04, 2009- Severe asthma, COPD, PAF, Asthma, RA Hospital follow-up after admitted 2/16-18/11 with very rapid onset exacerbation. We treated with asthma without being clear if she might have aspirated or some other mechanism. Since then has been home and well.. Dr Eden Emms has seen for cardiac f/u saying she was doing ok. She has tapered prednisone back down to 1/2 x 10 mg = 5 mg daily. She hadn't been using Advair since she got home. Denies coughing or blowing anything now.  Jan 20, 2010- Severe asthma, COPD, PAF, ASthma, RA Since last here she found knots in her legs, and Dr Marina Goodell dx'd phlebitis. He didn't like her breath sounds, got CXR showing upper right lung pneumonia- done at his office. He treated with Levaquin March 31. He also gave her prednisone. Meanwhile her husband had CVA.  Today her breathing is good for her. She was able to work outdoors all day yesterday.   Current Medications (verified): 1)  Advair Diskus 250-50 Mcg/dose  Misc (Fluticasone-Salmeterol) .... Inhale 1 Puff Two Times A Day 2)  Proair Hfa 108 (90 Base) Mcg/act  Aers (Albuterol Sulfate) .... 2 Puffs 4 Times Daily If Needed 3)  Prednisone 5 Mg  Tabs (Prednisone) .... 3-6 Tabs Daily When Needed. 4)  Mucinex Dm Maximum Strength 60-1200 Mg  Tb12 (Dextromethorphan-Guaifenesin) .... Take 1 Tablet By Mouth Two Times A Day 5)  Hydrochlorothiazide 25 Mg  Tabs (Hydrochlorothiazide) .Marland Kitchen.. 1 Tab By Mouth Once Daily 6)  Lexapro 10 Mg  Tabs (Escitalopram Oxalate) .... 1/2 Tab By Mouth Once Daily 7)  Ramipril 5 Mg Caps (Ramipril) .... Take 1 By Mouth Once Daily 8)  Vitamin D 08657 Unit Caps (Ergocalciferol) .... Take 1 By Mouth Weekly 9)  Protonix 40 Mg Tbec (Pantoprazole Sodium) .... Take 1 By Mouth Once Daily 10)  Xopenex 1.25 Mg/69ml Nebu (Levalbuterol Hcl) .Marland Kitchen.. 1 Vial Via [employer] Two Times A Day  Allergies (verified): 1)  ! * Diltiazem 2)  ! * Singulair  Past History:  Past Medical History: Last updated: 06/07/2009 DERMATITIS (ICD-692.9) GERD (ICD-530.81) DEPRESSION (ICD-311) AORTIC STENOSIS (ICD-424.1) mild by echo 2008 PAROXYSMAL ATRIAL FIBRILLATION (ICD-427.31) - 11/08 ALLERGIC RHINITIS (ICD-477.9) RHEUMATOID ARTHRITIS (ICD-714.0) HYPERTENSION (ICD-401.9) BRONCHITIS, OBSTRUCTIVE CHRONIC, ACUTE EXACERBATION (ICD-491.21) COLON CANCER, FAMILY HISTORY (ICD-V16.0) STASIS DERMATITIS (ICD-454.1) MULTIPLE SCLEROSIS (ICD-340) COPD (ICD-496) ASTHMA, CHRONIC OBSTRUCTIVE NOS (ICD-493.20) PFT 11/25/06 severe COPD Hypertension Rheumatoid Arthritis  Acute urinary infection not otherwise specified.   Past Surgical History: Last updated: 06/04/2009 Hysterectomy Breast cyst  L5 diskectomy in 2003.   Family History: Last updated: Sep 15, 2009 Mother- deceased age 76;rheumatism, asthma Father- deceased age 34; emphysema 53 Brothers and Sisters- 1 with colon cancer and 1 deceased  with heart trouble.  Brother- died Alzheimers  Social History: Last updated: 02/29/2008 Patient states former smoker.  Quit in 1991. Cares for husband (demented) Married with 2 children.  Risk Factors: Smoking Status: quit (02/29/2008)  Review of Systems       See HPI       The patient complains of dyspnea on exertion.  The patient denies anorexia, fever, weight loss, weight gain, vision loss, decreased hearing, hoarseness, chest pain, syncope, peripheral edema, prolonged cough, headaches, hemoptysis, abdominal pain, and severe indigestion/heartburn.    Vital Signs:  Patient profile:   77 year old female Height:      63 inches Weight:      149.50 pounds BMI:     26.58 O2 Sat:      96 % on Room air Pulse rate:   96 / minute BP sitting:   110 / 82  (left arm) Cuff size:   regular  Vitals Entered By: Reynaldo Minium CMA (Jan 20, 2010 10:16 AM)  O2 Flow:  Room air  Physical Exam  Additional Exam:  General: A/Ox3; pleasant and cooperative, NAD, SKIN: sfaint viral type lesion upper lip NODES: no lymphadenopathy HEENT: [place]/AT, EOM- WNL, Conjuctivae- clear, PERRLA, TM, clear left, , Nose- edema and blowing, Throat- clear and wnl, Mallampati II NECK: Supple w/ fair ROM, JVD- none, normal carotid impulses w/o bruits Thyroid-  CHEST: Distant without wheeze, unlabored-  HEART: RRR, no m/g/r heard ABDOMEN: soft NFA:OZHY, nl pulses, no edema . Thin calves. Tender behind right knee. NEURO: Grossly intact to observation      Impression & Recommendations:  Problem # 1:  ASTHMA, CHRONIC OBSTRUCTIVE NOS (ICD-493.20)  Doing well today after treatment for pneumonia a month ago. She is labile, with potential to worsen suddenly. I have not thought that her chest symptoms were from pulmonary embolism, but I do still watch for aspiration.  Problem # 2:  ? of PHLEBITIS, LOWER EXTREMITY (ICD-451.2)  She has no family hx of clotting disorder, and no personal hx of VTE. Her legs are thin. We will get D-dimer and Doppler leg veins because she continues to note pain behind right knee. This may be a Baker's Cyst.  Other Orders: Est. Patient Level III (86578) T-D-Dimer Fibrin Derivatives Quantitive [phone]) Doppler Referral (Doppler)  Patient  Instructions: 1)  Please schedule a follow-up appointment in 3 months. 2)  See [facility] to schedule doppler leg vein study 3)  Lab

## 2010-10-21 NOTE — Medication Information (Signed)
Summary: [facility] / [facility]  [facility] / [facility]   Imported By: Lennie Odor 08/18/2010 12:25:06  _____________________________________________________________________  External Attachment:    Type:   Image     Comment:   External Document

## 2010-10-21 NOTE — Medication Information (Signed)
Summary: [facility]  [facility]   Imported By: Lester Peebles 09/23/2009 09:32:46  _____________________________________________________________________  External Attachment:    Type:   Image     Comment:   External Document

## 2010-10-21 NOTE — Assessment & Plan Note (Signed)
Summary: increased sob/mg   Primary Provider/Referring Provider:  Duffy Rhody  CC:  Accute Visit-SOB, dizziness, and tightness in chest..  History of Present Illness:  May 06, 2010 --Presents for return for persistent symptoms. She complains that her  wheezing, increased SOB, prod cough with yellow mucus - states symptoms have not improved since last ov 8.1.11. Her cough is worse now with discolored mucus. She was started on Daliresp last visit. Denies chest pain,  orthopnea, hemoptysis, fever, n/v/d, edema, headache/  May 06, 2010-- wheezing, increased SOB, prod cough with yellow mucus - states symptoms have not improved since last ov 8.1.11  May 23, 2010- Chronic obstructive asthma, PAF, RA Cough finally resolved as she finished antibiotic Levaquin from last visit. Now clear almost 2 weeks. Insurance wouldn't cover Daliresp. She isn't sure whether it was helping or not. We discussed it and she decided she wanted to self pay and try it another month.  June 27, 2010  Chronic obstructive asthma, PAF, RA cc: Acute Visit-SOB, dizziness, tightness in chest. Note tachycardia on arrival 129/min. We had called biaxin and prednisone for productive cough and finishing that she felt very well. Went to [employer] with sister. On return began feeling weak and aware of rapid pulse with no chest pain. Head stopped up lying in bed with oxygen last night. Denies any tightness, sweling or aching in legs.  Chest feels tight with no cough or phlegm. Denies blood, fever, chills, sore throat, chest pain, leg pain or swelling. EKG- sinus tach 109. Pulmonary disease pattern.     Asthma History    Asthma Control Assessment:    Age range: 12+ years    Symptoms: >2 days/week    Nighttime Awakenings: 0-2/month    Interferes w/ normal activity: some limitations    SABA use (not for EIB): several times per day    Asthma Control Assessment: Very Poorly Controlled   Preventive Screening-Counseling &  Management  Alcohol-Tobacco     Smoking Status: quit > 6 months     Smoking Cessation Counseling: yes     Packs/Day: 2.0     Year Started: 1954     Year Quit: 1991     Pack years: 30 yrs  1 pack daily     Tobacco Counseling: not to resume use of tobacco products  Current Medications (verified): 1)  Advair Diskus 250-50 Mcg/dose  Misc (Fluticasone-Salmeterol) .... Inhale 1 Puff Two Times A Day 2)  Proair Hfa 108 (90 Base) Mcg/act  Aers (Albuterol Sulfate) .... 2 Puffs 4 Times Daily If Needed 3)  Prednisone 10 Mg Tabs (Prednisone) .... Take As Directed 4)  Mucinex Dm Maximum Strength 60-1200 Mg  Tb12 (Dextromethorphan-Guaifenesin) .... Take 1 Tablet By Mouth Two Times A Day 5)  Hydrochlorothiazide 25 Mg  Tabs (Hydrochlorothiazide) .Marland Kitchen.. 1 Tab By Mouth Once Daily 6)  Lexapro 10 Mg  Tabs (Escitalopram Oxalate) .... 1/2 Tab By Mouth Once Daily 7)  Protonix 40 Mg Tbec (Pantoprazole Sodium) .... Take 1 By Mouth Once Daily 8)  Xopenex 1.25 Mg/74ml Nebu (Levalbuterol Hcl) .Marland Kitchen.. 1 Vial Via [employer] Two Times A Day 9)  Losartan Potassium 25 Mg Tabs (Losartan Potassium) .... Take 1 Tablet By Mouth Once A Day 10)  Daliresp 500 Mcg .Marland KitchenMarland Kitchen. 1 Daily 11)  Oxygen .... Wear 2l/min As Needed With Activity and At Bedtime  Allergies (verified): 1)  ! * Diltiazem 2)  ! * Singulair  Past History:  Past Medical History: Last updated: 04/04/2010 DERMATITIS (ICD-692.9) GERD (ICD-530.81)  DEPRESSION (ICD-311) AORTIC STENOSIS (ICD-424.1) mild by echo 2008 PAROXYSMAL ATRIAL FIBRILLATION (ICD-427.31) - 11/08 ALLERGIC RHINITIS (ICD-477.9) RHEUMATOID ARTHRITIS (ICD-714.0)--chronic low dose prednisone at 5mg  once daily  HYPERTENSION (ICD-401.9) BRONCHITIS, OBSTRUCTIVE CHRONIC, ACUTE EXACERBATION (ICD-491.21) COLON CANCER, FAMILY HISTORY (ICD-V16.0) STASIS DERMATITIS (ICD-454.1) MULTIPLE SCLEROSIS (ICD-340) COPD (ICD-496) ASTHMA, CHRONIC OBSTRUCTIVE NOS (ICD-493.20) PFT  severe COPD--7/10>>FEV1 39%, ratio of 31    Hypertension Rheumatoid Arthritis  Acute urinary infection not otherwise specified.   Past Surgical History: Last updated: 06/04/2009 Hysterectomy Breast cyst  L5 diskectomy in 2003.   Family History: Last updated: 09-13-09 Mother- deceased age 59;rheumatism, asthma Father- deceased age 68; emphysema 46 Brothers and Sisters- 1 with colon cancer and 1 deceased with heart trouble.  Brother- died Alzheimers  Social History: Last updated: 02/29/2008 Patient states former smoker.  Quit in 1991. Cares for husband (demented) Married with 2 children.  Risk Factors: Smoking Status: quit > 6 months (06/27/2010) Packs/Day: 2.0 (06/27/2010)  Review of Systems      See HPI  The patient denies coughing up blood, chest pain, irregular heartbeats, acid heartburn, indigestion, loss of appetite, weight change, abdominal pain, difficulty swallowing, sore throat, tooth/dental problems, itching, ear ache, rash, change in color of mucus, and fever.    Vital Signs:  Patient profile:   77 year old female Height:      63 inches Weight:      140.25 pounds O2 Sat:      92 % on Room air Pulse rate:   129 / minute BP sitting:   124 / 78  (left arm) Cuff size:   regular  Vitals Entered By: Reynaldo Minium CMA (June 27, 2010 9:50 AM)  O2 Flow:  Room air CC: Accute Visit-SOB, dizziness, tightness in chest.   Physical Exam  Additional Exam:  General: A/Ox3; pleasant in NAD, SKIN: clear w/ no rash.  NODES: no lymphadenopathy noted.  HEENT: [place]/AT, EOM- WNL, Conjuctivae- clear, PERRLA, TM, clear left, , Nose- edema and blowing, Throat- clear and wnl- not red,  Mallampati II NECK: Supple w/ fair ROM, JVD- none, normal carotid impulses w/o bruits Thyroid-  CHEST:No wheeze, dullness or rhonchi heard HEART: rapid regular pulse with no murmur or rub ABDOMEN: soft YNW:GNFA, nl pulses, no edema . Thin calves, negative Homan's, no cyanosis or clubbing NEURO: Grossly intact to  observation      Impression & Recommendations:  Problem # 1:  BRONCHITIS, OBSTRUCTIVE CHRONIC, ACUTE EXACERBATION (ICD-491.21)  EKG shows ST now, consistent with her leaning a little harder on her meds because she didn't feel well. Did get flu shot. No obvious infection now. I don't think she has had a cardiac injury, PE or PTX. I will have her go home and take it easy, resting on her oxygen a little more this weekend. She has relaxed now and clearly feels better- conversational. I will handle this as a mild respiratory exacerbation. Will give script for antibiotic to hold.   Problem # 2:  OTHER SPECIFIED CARDIAC DYSRHYTHMIAS (ICD-427.89) As above.  Medications Added to Medication List This Visit: 1)  Zithromax Z-pak 250 Mg Tabs (Azithromycin) .... 2 today then one daily  Other Orders: Est. Patient Level IV (21308)  Patient Instructions: 1)  Keep planned appointment- call earlier if needed 2)  Take it a little easy this weekend and if you don't feel right, sit and breathe on your oxygen. if you think you have an infection you can take the Z pak. If your heart rate speeds up you may  need to go to the ER.  Prescriptions: ZITHROMAX Z-PAK 250 MG TABS (AZITHROMYCIN) 2 today then one daily  #1 pak x 0   Entered and Authorized by:   Waymon Budge MD   Signed by:   Waymon Budge MD on 06/27/2010   Method used:   Print then Give to Patient   RxID:   [id number]

## 2010-10-21 NOTE — Progress Notes (Signed)
Summary: rx request---Daliresp  Phone Note Call from Patient Call back at Home Phone [phone]   Caller: Patient Call For: young Summary of Call: pt states that the samples given her of DALIRESP 500 MCG work well and requests rx for this. [facility].  Initial call taken by: Tivis Ringer, CNA,  May 20, 2010 10:15 AM  Follow-up for Phone Call        pt was given a sample of Daliresp by CY at her OV on 04-21-2010.  Pt now calling requesting an rx for this to be sent to her pharmacy.  Please advise if ok to send rx or not.  Thanks. Aundra Millet Reynolds LPN  May 20, 2010 10:27 AM   Additional Follow-up for Phone Call Additional follow up Details #1::        OK to send Daliresp 500 micrograms . I have added to med list  Additional Follow-up by: Waymon Budge MD,  May 20, 2010 12:17 PM    Additional Follow-up for Phone Call Additional follow up Details #2::    [facility] informing pt rx sent to pharmacy.  Aundra Millet Reynolds LPN  May 20, 2010 12:26 PM   Prescriptions: DALIRESP 500 MCG 1 daily  #30 x 5   Entered by:   Arman Filter LPN   Authorized by:   Waymon Budge MD   Signed by:   Arman Filter LPN on 86/57/8469   Method used:   Telephoned to ...       [facility] (retail)       [address]       [place], [place]  [address]       Ph: [phone] or [phone]       Fax: [phone]   RxID:   [id number] DALIRESP 500 MCG 1 daily  #30 x 5   Entered by:   Waymon Budge MD   Authorized by:   Pulmonary Triage   Signed by:   Waymon Budge MD on 05/20/2010   Method used:   Historical   RxID:   [id number]

## 2010-10-21 NOTE — Progress Notes (Signed)
Summary: benicar changed to losartan  Phone Note From Pharmacy Call back at [phone]   Caller: [facility] Call For: young  Request: Needs authorization from insurer Summary of Call: Needs prior autho for benicar 20mg . Initial call taken by: Darletta Moll,  April 11, 2010 1:58 PM  Follow-up for Phone Call        ins co prefers that dr rx losartan per wendy at [facility]. Tivis Ringer, CNA  April 11, 2010 2:01 PM  Pt d/c'd from hospital on Benicar 20mg .  Per Toniann Fail at [facility], prefered drug is losartan.  Dr. Maple Hudson pls advise if ok to switch to losartan or proceed with Prior Auth.  Thanks! Follow-up by: Gweneth Dimitri RN,  April 11, 2010 3:06 PM  Additional Follow-up for Phone Call Additional follow up Details #1::        Per CDY-ok to change to Losartan.Reynaldo Minium CMA  April 11, 2010 4:11 PM   Does he want losartan 25mg  once daily?  If not, pls advise on strength/instructions.  Thanks! Gweneth Dimitri RN  April 11, 2010 4:13 PM   Yes #30 take 1 daily.Reynaldo Minium CMA  April 11, 2010 4:36 PM     Additional Follow-up for Phone Call Additional follow up Details #2::    losartan 25mg  #30 1 by mouth once daily w/ 6 refills called to [facility] @ [facility].  med added to med list. Karleen Seebeck CNA/MA  April 11, 2010 4:39 PM   New/Updated Medications: LOSARTAN POTASSIUM 25 MG TABS (LOSARTAN POTASSIUM) Take 1 tablet by mouth once a day Prescriptions: LOSARTAN POTASSIUM 25 MG TABS (LOSARTAN POTASSIUM) Take 1 tablet by mouth once a day  #30 x 6   Entered by:   Boone Master CNA/MA   Authorized by:   Waymon Budge MD   Signed by:   Boone Master CNA/MA on 04/11/2010   Method used:   Telephoned to ...       [facility] (retail)       [address]       [place], [place]  [address]       Ph: [phone] or [phone]       Fax: [phone]   RxID:   [id number]

## 2010-10-21 NOTE — Progress Notes (Signed)
Summary: prod cough > ok for biaxin and pred taper per CDY  Phone Note Call from Patient   Caller: Patient Call For: YOUNG Summary of Call: COUGHING YELLOW CONGESTION PREDNISONE AND COUGH MEDICATIONS IS NOT HELPING. [facility] [phone] Initial call taken by: Rickard Patience,  June 13, 2010 9:21 AM  Follow-up for Phone Call        called spoke with patient who c/o prod cough with "deep yellow" mucus, wheezing, increased SOB x2days.  states increased prednisone to 30mg  yesterday and today; and has been taking hydromet given to her in the hospital.  please advise, thanks!  allergies: diltiazem, singulair.  last ov 9.2.11, next 11.1.11 Follow-up by: Boone Master CNA/MA,  June 13, 2010 10:24 AM  Additional Follow-up for Phone Call Additional follow up Details #1::        Per CDY-ok to give pt Biaxin 500mg  #14 take 1 by mouth two times a day no refills and INCREASE prednisone to 40mg  or 50mg  a day until this breaks then taper down.Reynaldo Minium CMA  June 13, 2010 11:58 AM   called spoke with patient, advised of CDYs recs as stated above.  pt verbalized her understanding.  requested a refill on the prednisone since she did not have any refills.  per Florentina Addison, okay to refill per Florentina Addison as was last refilled prednisone 10mg  #60, take as directed.  rx's sent to [facility]. Additional Follow-up by: Boone Master CNA/MA,  June 13, 2010 12:21 PM    New/Updated Medications: PREDNISONE 10 MG TABS (PREDNISONE) take as directed BIAXIN 500 MG TABS (CLARITHROMYCIN) Take 1 tablet by mouth two times a day x7days Prescriptions: BIAXIN 500 MG TABS (CLARITHROMYCIN) Take 1 tablet by mouth two times a day x7days  #14 x 0   Entered by:   Boone Master CNA/MA   Authorized by:   Waymon Budge MD   Signed by:   Boone Master CNA/MA on 06/13/2010   Method used:   Faxed to ...       [facility] (retail)       [address]       [place], [place]   [address]       Ph: [phone] or [phone]       Fax: [phone]   RxID:   [id number] PREDNISONE 10 MG TABS (PREDNISONE) take as directed  #60 x 0   Entered by:   Boone Master CNA/MA   Authorized by:   Waymon Budge MD   Signed by:   Boone Master CNA/MA on 06/13/2010   Method used:   Faxed to ...       [facility] (retail)       [address]       [place], [place]  [address]       Ph: [phone] or [phone]       Fax: [phone]   RxID:   [id number]

## 2010-10-21 NOTE — Letter (Signed)
Summary: Certificate of Medical Necessity fo Oxygen / [facility]   Certificate of Medical Necessity fo Oxygen / [facility]   Imported By: Lennie Odor 04/28/2010 16:13:19  _____________________________________________________________________  External Attachment:    Type:   Image     Comment:   External Document

## 2010-10-21 NOTE — Assessment & Plan Note (Signed)
Summary: 6 month rov/sl   Primary Provider:  Duffy Rhody  CC:  abdominal pain.  History of Present Illness: [name] is seen today for F/U of dyspnea, AS and PAF.  She has terrible reactive airway disease She has been hospitalized in January and just D/C 2./18/11 for astma flares with respitory distress.    From a cardiac perspective she is stable.  She had PAF in 2008 with no recurrence.  She is not having palpitation.  She is at risk for MAT given her dyspnea from asthma and this tends to run her HR a little high.  She needs a F/U echo for AS.  Her mean gradient in 2008 was .  She is not having any SSCP, cough, sputum edema or fever.  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     Current Problems (verified): 1)  Cold Sore  (ICD-054.9) 2)  Dyspnea  (ICD-786.05) 3)  Dermatitis  (ICD-692.9) 4)  Gerd  (ICD-530.81) 5)  Depression  (ICD-311) 6)  Aortic Stenosis  (ICD-424.1) 7)  Paroxysmal Atrial Fibrillation  (ICD-427.31) 8)  Allergic Rhinitis  (ICD-477.9) 9)  Rheumatoid Arthritis  (ICD-714.0) 10)  Hypertension  (ICD-401.9) 11)  Bronchitis, Obstructive Chronic, Acute Exacerbation  (ICD-491.21) 12)  Colon Cancer, Family History  (ICD-V16.0) 13)  Stasis Dermatitis  (ICD-454.1) 14)  Multiple Sclerosis  (ICD-340) 15)  COPD  (ICD-496) 16)  Asthma, Chronic Obstructive Nos  (ICD-493.20)  Current Medications (verified): 1)  Advair Diskus 250-50 Mcg/dose  Misc (Fluticasone-Salmeterol) .... Inhale 1 Puff Two Times A Day 2)  Proair Hfa 108 (90 Base) Mcg/act  Aers (Albuterol Sulfate) .... 2 Puffs 4 Times Daily If Needed 3)  Prednisone 5 Mg Tabs (Prednisone) .... 3-6 Tabs Daily When Needed. 4)  Mucinex Dm Maximum Strength 60-1200 Mg  Tb12 (Dextromethorphan-Guaifenesin) .... Take 1 Tablet By Mouth Two Times A Day 5)  Hydrochlorothiazide 25 Mg  Tabs (Hydrochlorothiazide) .Marland Kitchen.. 1 Tab By Mouth Once Daily 6)  Lexapro 10 Mg  Tabs (Escitalopram Oxalate) .... 1/2 Tab By Mouth Once  Daily 7)  Ramipril 5 Mg Caps (Ramipril) .... Take 1 By Mouth Once Daily 8)  Vitamin D 16109 Unit Caps (Ergocalciferol) .... Take 1 By Mouth Weekly 9)  Protonix 40 Mg Tbec (Pantoprazole Sodium) .... Take 1 By Mouth Once Daily 10)  Xopenex 1.25 Mg/61ml Nebu (Levalbuterol Hcl) .Marland Kitchen.. 1 Vial Via [employer] Two Times A Day  Allergies (verified): 1)  ! * Diltiazem 2)  ! * Singulair  Past History:  Past Medical History: Last updated: 06/07/2009 DERMATITIS (ICD-692.9) GERD (ICD-530.81) DEPRESSION (ICD-311) AORTIC STENOSIS (ICD-424.1) mild by echo 2008 PAROXYSMAL ATRIAL FIBRILLATION (ICD-427.31) - 11/08 ALLERGIC RHINITIS (ICD-477.9) RHEUMATOID ARTHRITIS (ICD-714.0) HYPERTENSION (ICD-401.9) BRONCHITIS, OBSTRUCTIVE CHRONIC, ACUTE EXACERBATION (ICD-491.21) COLON CANCER, FAMILY HISTORY (ICD-V16.0) STASIS DERMATITIS (ICD-454.1) MULTIPLE SCLEROSIS (ICD-340) COPD (ICD-496) ASTHMA, CHRONIC OBSTRUCTIVE NOS (ICD-493.20) PFT 11/25/06 severe COPD Hypertension Rheumatoid Arthritis  Acute urinary infection not otherwise specified.   Past Surgical History: Last updated: 06/04/2009 Hysterectomy Breast cyst  L5 diskectomy in 2003.   Family History: Last updated: Sep 03, 2009 Mother- deceased age 17;rheumatism, asthma Father- deceased age 78; emphysema 75 Brothers and Sisters- 1 with colon cancer and 1 deceased with heart trouble.  Brother- died Alzheimers  Social History: Last updated: 02/29/2008 Patient states former smoker.  Quit in 1991. Cares for husband (demented) Married with 2 children.  Review of Systems       Denies fever, malais, weight loss, blurry vision, decreased visual acuity, cough, sputum,  hemoptysis,  pleuritic pain, palpitaitons, heartburn, abdominal pain, melena, lower extremity edema, claudication, or rash.   Vital Signs:  Patient profile:   77 year old female Height:      63 inches Weight:      149 pounds BMI:     26.49 Pulse rate:   79 / minute Resp:     14 per  minute BP sitting:   126 / 78  (left arm)  Vitals Entered By: Kem Parkinson (November 25, 2009 10:26 AM)  Physical Exam  General:  Affect appropriate Healthy:  appears stated age HEENT: normal Neck supple with no adenopathy JVP normal no bruits no thyromegaly Lungs clear with no wheezing and good diaphragmatic motion Heart:  S1/S2 mild AS  murmur, no rub, gallop or click PMI normal Abdomen: benighn, BS positve, no tenderness, no AAA no bruit.  No HSM or HJR Distal pulses intact with no bruits No edema Neuro non-focal Skin warm and dry    Impression & Recommendations:  Problem # 1:  DYSPNEA (ICD-786.05) Chronic reactive airway disease.  PA pressure around by echo.  F/U Dr Maple Hudson Her updated medication list for this problem includes:    Hydrochlorothiazide 25 Mg Tabs (Hydrochlorothiazide) .Marland Kitchen... 1 tab by mouth once daily    Ramipril 5 Mg Caps (Ramipril) .Marland Kitchen... Take 1 by mouth once daily  Problem # 2:  AORTIC STENOSIS (ICD-424.1) Mild no change in murmur.  F/U echo in 1 year Her updated medication list for this problem includes:    Hydrochlorothiazide 25 Mg Tabs (Hydrochlorothiazide) .Marland Kitchen... 1 tab by mouth once daily    Ramipril 5 Mg Caps (Ramipril) .Marland Kitchen... Take 1 by mouth once daily  Problem # 3:  PAROXYSMAL ATRIAL FIBRILLATION (ICD-427.31) Maint NSR with no evidence of MAT  Problem # 4:  HYPERTENSION (ICD-401.9) Under good control continue low sodium diet Her updated medication list for this problem includes:    Hydrochlorothiazide 25 Mg Tabs (Hydrochlorothiazide) .Marland Kitchen... 1 tab by mouth once daily    Ramipril 5 Mg Caps (Ramipril) .Marland Kitchen... Take 1 by mouth once daily

## 2010-10-21 NOTE — Progress Notes (Signed)
Summary: increased sob- ov with CY   Phone Note Call from Patient Call back at Home Phone [phone]   Caller: Patient Call For: young Summary of Call: Pt c/o sob, tightness in chest, dizziness since Tuesday, pls advise.//[facility] Initial call taken by: Darletta Moll,  June 26, 2010 3:10 PM  Follow-up for Phone Call        called and spoke with pt.  Pt finished Pred taper and Biaxin that was given to her 06/13/2010 ( see phone note for complete details)  Pt is now back down to Prednisone 20mg  tabs daily.  Pt states she went to the beach recently and is now c/o tightness in chest and increased sob.  Pt denied a cough.  Offered pt an appt.  Pt agreed to come in and be seen by CY tomorrow 06/27/2010 at 10:00am.  Instructed pt to go to Urgent Care or ER if symptoms worsen over the night and she feels she needs immediate attention.  Pt verbalized understanding.  Aundra Millet Reynolds LPN  June 26, 2010 3:44 PM

## 2010-10-21 NOTE — Assessment & Plan Note (Signed)
Summary: hfu per Brett Canales /jd   Visit Type:  Hospital Follow-up Primary Provider/Referring Provider:  Duffy Rhody  CC:  [facility]  History of Present Illness: Sep 01, 2009- Severe asthma, COPD, PAF, AS, RA Was tighter last week. Only in last day has she begun coughing up some dark mucus. Had flu vax.  She took Z pak 1-2 months ago. She alternates prednisone 5 mg with 1/2 x 5mg  every other day. She ran out of Advair yesterday. Some postnasal drip, but not fever or nodes. Sinus pressure.  September 16, 2009- Severe asthma, COPD, PAF, AS, RA Bad cold with gastroenteritis started 2 weeks ago. GI improved. Then moved worse into chest. Took standby script for antibiotic - big help while on it. Fi9nished first round 3-4 days ago. Now back sliding She took prednisone 30 mg this AM, prior was taking 1/2 x 5 mg daily.  October 22, 2009- Severe asthma, COPD, PAF, AS, RA Hosp late December for exacerb COPD which cleared only slowly. She feels back to baseline finally. RUQ pain eval by Dr Marina Goodell w/ G B scan "gas". Currenly no wheeze or cough. Some exertional dyspnea. Taking an antiviral pill for viral "fever blisters" on lips.  December 04, 2009- Severe asthma, COPD, PAF, Asthma, RA Hospital follow-up after admitted 2/16-18/11 with very rapid onset exacerbation. We treated with asthma without being clear if she might have aspirated or some other mechanism. Since then has been home and well.. Dr Eden Emms has seen for cardiac f/u saying she was doing ok. She has tapered prednisone back down to 1/2 x 10 mg = 5 mg daily. She hadn't been using Advair since she got home. Denies coughing or blowing anything now.  Current Medications (verified): 1)  Advair Diskus 250-50 Mcg/dose  Misc (Fluticasone-Salmeterol) .... Inhale 1 Puff Two Times A Day 2)  Proair Hfa 108 (90 Base) Mcg/act  Aers (Albuterol Sulfate) .... 2 Puffs 4 Times Daily If Needed 3)  Prednisone 5 Mg Tabs (Prednisone) .... 3-6 Tabs Daily When  Needed. 4)  Mucinex Dm Maximum Strength 60-1200 Mg  Tb12 (Dextromethorphan-Guaifenesin) .... Take 1 Tablet By Mouth Two Times A Day 5)  Hydrochlorothiazide 25 Mg  Tabs (Hydrochlorothiazide) .Marland Kitchen.. 1 Tab By Mouth Once Daily 6)  Lexapro 10 Mg  Tabs (Escitalopram Oxalate) .... 1/2 Tab By Mouth Once Daily 7)  Ramipril 5 Mg Caps (Ramipril) .... Take 1 By Mouth Once Daily 8)  Vitamin D 16109 Unit Caps (Ergocalciferol) .... Take 1 By Mouth Weekly 9)  Protonix 40 Mg Tbec (Pantoprazole Sodium) .... Take 1 By Mouth Once Daily 10)  Xopenex 1.25 Mg/36ml Nebu (Levalbuterol Hcl) .Marland Kitchen.. 1 Vial Via [employer] Two Times A Day  Allergies (verified): 1)  ! * Diltiazem 2)  ! * Singulair  Past History:  Past Medical History: Last updated: 06/07/2009 DERMATITIS (ICD-692.9) GERD (ICD-530.81) DEPRESSION (ICD-311) AORTIC STENOSIS (ICD-424.1) mild by echo 2008 PAROXYSMAL ATRIAL FIBRILLATION (ICD-427.31) - 11/08 ALLERGIC RHINITIS (ICD-477.9) RHEUMATOID ARTHRITIS (ICD-714.0) HYPERTENSION (ICD-401.9) BRONCHITIS, OBSTRUCTIVE CHRONIC, ACUTE EXACERBATION (ICD-491.21) COLON CANCER, FAMILY HISTORY (ICD-V16.0) STASIS DERMATITIS (ICD-454.1) MULTIPLE SCLEROSIS (ICD-340) COPD (ICD-496) ASTHMA, CHRONIC OBSTRUCTIVE NOS (ICD-493.20) PFT 11/25/06 severe COPD Hypertension Rheumatoid Arthritis  Acute urinary infection not otherwise specified.   Past Surgical History: Last updated: 06/04/2009 Hysterectomy Breast cyst  L5 diskectomy in 2003.   Family History: Last updated: September 01, 2009 Mother- deceased age 50;rheumatism, asthma Father- deceased age 27; emphysema 85 Brothers and Sisters- 1 with colon cancer and 1 deceased with heart trouble.  Brother- died Alzheimers  Social History: Last updated: 02/29/2008 Patient states former smoker.  Quit in 1991. Cares for husband (demented) Married with 2 children.  Risk Factors: Smoking Status: quit (02/29/2008)  Review of Systems      See HPI  The patient denies anorexia,  fever, weight loss, weight gain, vision loss, decreased hearing, hoarseness, chest pain, syncope, dyspnea on exertion, peripheral edema, prolonged cough, headaches, hemoptysis, abdominal pain, and severe indigestion/heartburn.    Vital Signs:  Patient profile:   77 year old female Height:      63 inches Weight:      149.38 pounds BMI:     26.56 O2 Sat:      96 % on Room air Pulse rate:   100 / minute BP sitting:   118 / 62  (left arm) Cuff size:   regular  Vitals Entered By: Vivianne Spence  O2 Flow:  Room air  Physical Exam  Additional Exam:  General: A/Ox3; pleasant and cooperative, NAD, SKIN: sfaint viral type lesion upper lip NODES: no lymphadenopathy HEENT: [place]/AT, EOM- WNL, Conjuctivae- clear, PERRLA, TM, clear left, , Nose- edema and blowing, Throat- clear and wnl, [place] II NECK: Supple w/ fair ROM, JVD- none, normal carotid impulses w/o bruits Thyroid-  CHEST: Distant without wheeze, unlabored- very clear today HEART: RRR, no m/g/r heard ABDOMEN: soft ZOX:WRUE, nl pulses, no edema  NEURO: Grossly intact to observation      Impression & Recommendations:  Problem # 1:  ASTHMA, CHRONIC OBSTRUCTIVE NOS (ICD-493.20)  Today she is very clear and resuming normal activities. I would like her to resume Advair 250 at a maintenance level of once daily. Maintenance steroid therapy issues discussed.  Other Orders: Est. Patient Level II (45409)  Patient Instructions: 1)  Keep appointment for May 2, unless needed sooner. 2)  I suggest you continue prednisone for now at 5 mg daily and also resume your Advair 250/50 at just one puff, once daily for maintenance.

## 2010-10-21 NOTE — Assessment & Plan Note (Signed)
Summary: 3 months/apc   Primary Provider/Referring Provider:  Duffy Rhody  CC:  3 month follow up for asthma, pt states she ahs good and bad days, she has some sinus drainage and a frontal headache due to fallen saturday and she hit her face, some chest congestion, pt was recently in the hospital for viral infection, and pt states she is doing better and the breathing is better.  History of Present Illness: Jan 20, 2010- Severe asthma, COPD, PAF, ASthma, RA Since last here she found knots in her legs, and Dr Marina Goodell dx'd phlebitis. He didn't like her breath sounds, got CXR showing upper right lung pneumonia- done at his office. He treated with Levaquin March 31. He also gave her prednisone.   March 31, 2010- Severe asthma, COPD, PAF, Asthma, RA Family had colds. 2 days ago she woke tight , weak and dyspneic. Cough productive thick yellow. sore throat, tussive headache. denies nausea. Yesterday she took 30 mg prednisone. Prior she had remained on 5 mg daily.Arrived here today with room air sat 66%, up to 91% on 4L.   April 04, 2010 -Returns for persistent symptoms of dyspnea, DOE, cough, wheezing. Seen 4 days ago tx w/ steroid burst, nebs. Some improvement for 2 days but yesterday symptoms worsened. Now cough is junky w/ thick yellow mucus, nasal drarinage. Wears out easily. She continues to desat in office today w/ O2 sat at 80%. on room air. With rest sats improve to 90%. Wheezing is getting worse esp at night.  Denies chest pain,  orthopnea, hemoptysis, fever, n/v/d, edema, headache. We have discussed that she will require hospitalization for futher tx and evaluation.    April 21, 2010- COPD, severe asthma, PAF, RA Hospital for a viral triggered exacerbation 7/15-22/11. She has tapered down on steroids. She tripped at store yesterday, hit face and broke glasses. Persistent nasl drainage and cough, with slow improvement in her breathing since the hospitial. Breathing more deeply. Cough produces  scant clear mucus. PFT review has shown severe baseline COPD.   Asthma History    Asthma Control Assessment:    Age range: 12+ years    Symptoms: throughout the day    Nighttime Awakenings: 4 or more/week    Interferes w/ normal activity: some limitations    SABA use (not for EIB): several times per day    Asthma Control Assessment: Very Poorly Controlled   Preventive Screening-Counseling & Management  Alcohol-Tobacco     Smoking Status: quit     Packs/Day: 1.0     Year Started: age 39     Year Quit: 20 years ago     Pack years: 30 yrs  1 pack daily  Current Medications (verified): 1)  Advair Diskus 250-50 Mcg/dose  Misc (Fluticasone-Salmeterol) .... Inhale 1 Puff Two Times A Day 2)  Proair Hfa 108 (90 Base) Mcg/act  Aers (Albuterol Sulfate) .... 2 Puffs 4 Times Daily If Needed 3)  Prednisone 5 Mg Tabs (Prednisone) .... 3-6 Tabs Daily When Needed. 4)  Mucinex Dm Maximum Strength 60-1200 Mg  Tb12 (Dextromethorphan-Guaifenesin) .... Take 1 Tablet By Mouth Two Times A Day 5)  Hydrochlorothiazide 25 Mg  Tabs (Hydrochlorothiazide) .Marland Kitchen.. 1 Tab By Mouth Once Daily 6)  Lexapro 10 Mg  Tabs (Escitalopram Oxalate) .... 1/2 Tab By Mouth Once Daily 7)  Ramipril 5 Mg Caps (Ramipril) .... Take 1 By Mouth Once Daily 8)  Protonix 40 Mg Tbec (Pantoprazole Sodium) .... Take 1 By Mouth Once Daily 9)  Xopenex 1.25  Mg/24ml Nebu (Levalbuterol Hcl) .Marland Kitchen.. 1 Vial Via [employer] Two Times A Day 10)  Losartan Potassium 25 Mg Tabs (Losartan Potassium) .... Take 1 Tablet By Mouth Once A Day  Allergies (verified): 1)  ! * Diltiazem 2)  ! * Singulair  Past History:  Past Medical History: Last updated: 04/04/2010 DERMATITIS (ICD-692.9) GERD (ICD-530.81) DEPRESSION (ICD-311) AORTIC STENOSIS (ICD-424.1) mild by echo 2008 PAROXYSMAL ATRIAL FIBRILLATION (ICD-427.31) - 11/08 ALLERGIC RHINITIS (ICD-477.9) RHEUMATOID ARTHRITIS (ICD-714.0)--chronic low dose prednisone at 5mg  once daily  HYPERTENSION  (ICD-401.9) BRONCHITIS, OBSTRUCTIVE CHRONIC, ACUTE EXACERBATION (ICD-491.21) COLON CANCER, FAMILY HISTORY (ICD-V16.0) STASIS DERMATITIS (ICD-454.1) MULTIPLE SCLEROSIS (ICD-340) COPD (ICD-496) ASTHMA, CHRONIC OBSTRUCTIVE NOS (ICD-493.20) PFT  severe COPD--7/10>>FEV1 39%, ratio of 31  Hypertension Rheumatoid Arthritis  Acute urinary infection not otherwise specified.   Past Surgical History: Last updated: 06/04/2009 Hysterectomy Breast cyst  L5 diskectomy in 2003.   Family History: Last updated: 2009/09/07 Mother- deceased age 32;rheumatism, asthma Father- deceased age 70; emphysema 46 Brothers and Sisters- 1 with colon cancer and 1 deceased with heart trouble.  Brother- died Alzheimers  Social History: Last updated: 02/29/2008 Patient states former smoker.  Quit in 1991. Cares for husband (demented) Married with 2 children.  Risk Factors: Smoking Status: quit (04/21/2010) Packs/Day: 1.0 (04/21/2010)  Social History: Packs/Day:  1.0  Review of Systems      See HPI       The patient complains of shortness of breath with activity, shortness of breath at rest, productive cough, non-productive cough, and nasal congestion/difficulty breathing through nose.  The patient denies coughing up blood, chest pain, irregular heartbeats, acid heartburn, indigestion, loss of appetite, weight change, abdominal pain, difficulty swallowing, sore throat, tooth/dental problems, headaches, and sneezing.    Vital Signs:  Patient profile:   77 year old female Height:      63 inches Weight:      140.8 pounds O2 Sat:      91 % on Room air Pulse rate:   95 / minute BP sitting:   118 / 76  (right arm) Cuff size:   regular  Vitals Entered By: Carver Fila (April 21, 2010 10:16 AM)  O2 Flow:  Room air CC: 3 month follow up for asthma, pt states she ahs good and bad days, she has some sinus drainage and a frontal headache due to fallen saturday and she hit her face, some chest congestion, pt  was recently in the hospital for viral infection, pt states she is doing better and the breathing is better Comments meds and allergies updated Phone number updated Mindy Silva  April 21, 2010 10:17 AM n   Physical Exam  Additional Exam:  General: A/Ox3; pleasant in NAD, scab pm chin and bridge of nose SKIN: clear w/ no rash.  NODES: no lymphadenopathy noted.  HEENT: [place]/AT, EOM- WNL, Conjuctivae- clear, PERRLA, TM, clear left, , Nose- edema and blowing, Throat- clear and wnl- not red,  Mallampati II NECK: Supple w/ fair ROM, JVD- none, normal carotid impulses w/o bruits Thyroid-  CHEST: Ins/exp wheezing w/ upper airway psuedowheezing. . Able to talk in full sentences. no stridor.  HEART: RRR, no m/g/r heard ABDOMEN: soft ZOX:WRUE, nl pulses, no edema . Thin calves. NEURO: Grossly intact to observation      Impression & Recommendations:  Problem # 1:  COPD (ICD-496) She has severe underlying COPD with an emphysema pattern on PFT, but she is prone to significant exacerbations of  asthmatic bronchits that are slow to clear. I am  going to let her try the new airway antinflammatory Daliresp. Meanwhile she will continue low-dose steroid maintenance for the time being.  Problem # 2:  ALLERGIC RHINITIS (ICD-477.9) nasal turbinate edema. i don't think she hit her face hard enough to break her nose. This is mostly residual from the viral tracheobronchits that put her in the hospital. Pollens are not high now, but air quality is poor.  Medications Added to Medication List This Visit: 1)  Daliresp 500 Mcg  .Marland Kitchen.. 1 daily  Other Orders: Est. Patient Level IV (09811)  Patient Instructions: 1)  Please schedule a follow-up appointment in 3 months. 2)  Continue present routine meds. 3)  Try adding sample Daliresp airway antiinflammatory, 1 daily Prescriptions: DALIRESP 500 MCG 1 daily  #1 bottle x 0   Entered and Authorized by:   Waymon Budge MD   Signed by:   Waymon Budge MD on  04/21/2010   Method used:   Samples Given   RxID:   [id number]

## 2010-10-21 NOTE — Assessment & Plan Note (Signed)
Summary: 2 weeks/apc   Primary Provider/Referring Provider:  Duffy Rhody  CC:  2 week follow up - breathing is improved; dailiresp is too expensive.  History of Present Illness:  April 21, 2010- COPD, severe asthma, PAF, RA Hospital for a viral triggered exacerbation 7/15-22/11. She has tapered down on steroids. She tripped at store yesterday, hit face and broke glasses. Persistent nasl drainage and cough, with slow improvement in her breathing since the hospitial. Breathing more deeply. Cough produces scant clear mucus. PFT review has shown severe baseline COPD.  May 06, 2010 --Presents for return for persistent symptoms. She complains that her  wheezing, increased SOB, prod cough with yellow mucus - states symptoms have not improved since last ov 8.1.11. Her cough is worse now with discolored mucus. She was started on Daliresp last visit. Denies chest pain,  orthopnea, hemoptysis, fever, n/v/d, edema, headache/  May 06, 2010-- wheezing, increased SOB, prod cough with yellow mucus - states symptoms have not improved since last ov 8.1.11  May 23, 2010- Chronic obstructive asthma, PAF, RA Cough finally resolved as she finished antibiotic Levaquin from last visit. Now clear almost 2 weeks. Insurance wouldn't cover Daliresp. She isn't sure whether it was helping or not. We discussed it and she decided she wanted to self pay and try it another month.   Asthma History    Asthma Control Assessment:    Age range: 12+ years    Symptoms: 0-2 days/week    Nighttime Awakenings: 0-2/month    Interferes w/ normal activity: no limitations    SABA use (not for EIB): 0-2 days/week    Asthma Control Assessment: Well Controlled   Preventive Screening-Counseling & Management  Alcohol-Tobacco     Smoking Status: quit > 6 months     Smoking Cessation Counseling: yes     Packs/Day: 2.0     Year Started: 1956     Year Quit: 1991     Tobacco Counseling: not to resume use of tobacco  products  Current Medications (verified): 1)  Advair Diskus 250-50 Mcg/dose  Misc (Fluticasone-Salmeterol) .... Inhale 1 Puff Two Times A Day 2)  Proair Hfa 108 (90 Base) Mcg/act  Aers (Albuterol Sulfate) .... 2 Puffs 4 Times Daily If Needed 3)  Prednisone 10 Mg Tabs (Prednisone) .... Take As Directed 4)  Mucinex Dm Maximum Strength 60-1200 Mg  Tb12 (Dextromethorphan-Guaifenesin) .... Take 1 Tablet By Mouth Two Times A Day 5)  Hydrochlorothiazide 25 Mg  Tabs (Hydrochlorothiazide) .Marland Kitchen.. 1 Tab By Mouth Once Daily 6)  Lexapro 10 Mg  Tabs (Escitalopram Oxalate) .... 1/2 Tab By Mouth Once Daily 7)  Protonix 40 Mg Tbec (Pantoprazole Sodium) .... Take 1 By Mouth Once Daily 8)  Xopenex 1.25 Mg/52ml Nebu (Levalbuterol Hcl) .Marland Kitchen.. 1 Vial Via [employer] Two Times A Day 9)  Losartan Potassium 25 Mg Tabs (Losartan Potassium) .... Take 1 Tablet By Mouth Once A Day 10)  Daliresp 500 Mcg .Marland KitchenMarland Kitchen. 1 Daily 11)  Oxygen .... Wear 2l/min As Needed With Activity and At Bedtime  Allergies (verified): 1)  ! * Diltiazem 2)  ! * Singulair  Past History:  Past Medical History: Last updated: 04/04/2010 DERMATITIS (ICD-692.9) GERD (ICD-530.81) DEPRESSION (ICD-311) AORTIC STENOSIS (ICD-424.1) mild by echo 2008 PAROXYSMAL ATRIAL FIBRILLATION (ICD-427.31) - 11/08 ALLERGIC RHINITIS (ICD-477.9) RHEUMATOID ARTHRITIS (ICD-714.0)--chronic low dose prednisone at 5mg  once daily  HYPERTENSION (ICD-401.9) BRONCHITIS, OBSTRUCTIVE CHRONIC, ACUTE EXACERBATION (ICD-491.21) COLON CANCER, FAMILY HISTORY (ICD-V16.0) STASIS DERMATITIS (ICD-454.1) MULTIPLE SCLEROSIS (ICD-340) COPD (ICD-496) ASTHMA, CHRONIC OBSTRUCTIVE NOS (  ICD-493.20) PFT  severe COPD--7/10>>FEV1 39%, ratio of 31  Hypertension Rheumatoid Arthritis  Acute urinary infection not otherwise specified.   Past Surgical History: Last updated: 06/04/2009 Hysterectomy Breast cyst  L5 diskectomy in 2003.   Family History: Last updated: 01-Sep-2009 Mother- deceased age  67;rheumatism, asthma Father- deceased age 63; emphysema 62 Brothers and Sisters- 1 with colon cancer and 1 deceased with heart trouble.  Brother- died Alzheimers  Social History: Last updated: 02/29/2008 Patient states former smoker.  Quit in 1991. Cares for husband (demented) Married with 2 children.  Risk Factors: Smoking Status: quit > 6 months (05/23/2010) Packs/Day: 2.0 (05/23/2010)  Social History: Smoking Status:  quit > 6 months Packs/Day:  2.0  Review of Systems      See HPI       The patient complains of shortness of breath with activity.  The patient denies shortness of breath at rest, productive cough, non-productive cough, coughing up blood, chest pain, irregular heartbeats, acid heartburn, indigestion, loss of appetite, weight change, abdominal pain, difficulty swallowing, sore throat, tooth/dental problems, headaches, nasal congestion/difficulty breathing through nose, and sneezing.    Vital Signs:  Patient profile:   77 year old female Height:      63 inches Weight:      143.50 pounds BMI:     25.51 O2 Sat:      91 % on Room air Pulse rate:   110 / minute BP sitting:   148 / 80  (right arm) Cuff size:   regular  Vitals Entered By: Boone Master CNA/MA (May 23, 2010 2:53 PM)  O2 Flow:  Room air CC: 2 week follow up - breathing is improved; dailiresp is too expensive Comments Medications reviewed with patient Daytime contact number verified with patient. Boone Master CNA/MA  May 23, 2010 2:53 PM    Physical Exam  Additional Exam:  General: A/Ox3; pleasant in NAD, SKIN: clear w/ no rash.  NODES: no lymphadenopathy noted.  HEENT: [place]/AT, EOM- WNL, Conjuctivae- clear, PERRLA, TM, clear left, , Nose- edema and blowing, Throat- clear and wnl- not red,  Mallampati II NECK: Supple w/ fair ROM, JVD- none, normal carotid impulses w/o bruits Thyroid-  CHEST:clear today, markedly improved HEART: RRR, no m/g/r heard ABDOMEN: soft QMV:HQIO, nl  pulses, no edema . Thin calves. NEURO: Grossly intact to observation      Impression & Recommendations:  Problem # 1:  ASTHMA, CHRONIC OBSTRUCTIVE NOS (ICD-493.20) Currently stable and clear on maintenance meds and prednisone along with her other routine meds. We discussed Daliresp which is advertised to reduce need for hosp due to bronchitis flares, which sounds like her. We want to minimize need for repeated steroid bursts.  Check xolair potential with IgE level  Problem # 2:  ? of PHLEBITIS, LOWER EXTREMITY (ICD-451.2) This was ruled out. i will remove the problem from her list.  Problem # 3:  PAROXYSMAL ATRIAL FIBRILLATION (ICD-427.31) Rhythm now feels lie NSR. We are watching tolerance to bronchodilators.  Other Orders: Est. Patient Level IV (96295) T-IgE (Immunoglobulin E) (28413-24401)  Patient Instructions: 1)  Please schedule a follow-up appointment in 3 months. 2)  lab 3)  Sample Daliresp 500 micrograms, 1 daily

## 2010-10-21 NOTE — Progress Notes (Signed)
Summary: Dyspnea  Phone Note Call from Patient Call back at Home Phone [phone]   Caller: Patient Call For: young Reason for Call: Talk to Nurse Summary of Call: Having hard time breathing, 2-3 days, not any better, Wheezing and rattling, some cough. [facility] - [phone] Initial call taken by: Eugene Gavia,  January 09, 2010 3:47 PM  Follow-up for Phone Call        Spoke with pt.  She c/o increased SOB with exertion x 3 days, has  some wheezing also and very little cough that is non prod.  Pt states that she has used her proair x 2 today and it helps some but not for too long.  She also is on xopenex nebs two times a day.  Please advise recs thanks allergic to singulair and coreg Follow-up by: Vernie Murders,  January 09, 2010 3:53 PM  Additional Follow-up for Phone Call Additional follow up Details #1::        per CY offer pred taper, prednisone 10mg  #20 4x2days, 3x2 days, 2x2, 1x2 then stop. Carron Curie CMA  January 09, 2010 4:12 PM  pt advised and rx sent.Carron Curie CMA  January 09, 2010 4:23 PM     New/Updated Medications: PREDNISONE 10 MG TABS (PREDNISONE) 4 tablets daily x 2 days, 3x2 days, 2x 2days, 1 x 2 days Prescriptions: PREDNISONE 10 MG TABS (PREDNISONE) 4 tablets daily x 2 days, 3x2 days, 2x 2days, 1 x 2 days  #20 x 0   Entered by:   Carron Curie CMA   Authorized by:   Waymon Budge MD   Signed by:   Carron Curie CMA on 01/09/2010   Method used:   Faxed to ...       [facility] (retail)       [address]       [place], [place]  [address]       Ph: [phone] or [phone]       Fax: [phone]   RxID:   [id number]

## 2010-10-21 NOTE — Progress Notes (Signed)
Summary: sob/appt  Phone Note Call from Patient Call back at Home Phone [phone]   Caller: Patient Call For: young Reason for Call: Talk to Nurse Summary of Call: cough,congestion, head hurts, sore throat, drainage, not getting a lot up, sob.  Gave her an appt for 3:00, but she doesn't know if she can wait that long.  Is there any way to get in sooner?  She said she called Answering service this morning at 7:00am and no one called her back. Initial call taken by: Eugene Gavia,  March 31, 2010 8:15 AM  Follow-up for Phone Call        Please advise if able to work into RN slot this morning.Michel Bickers [facility]  March 31, 2010 8:44 AM    Appt this am at 945am with Dr. Maple Hudson.Reynaldo Minium CMA  March 31, 2010 8:55 AM

## 2010-10-21 NOTE — Progress Notes (Signed)
Summary: congested  Phone Note Call from Patient   Caller: Patient Call For: young Summary of Call: pt congested and cough she has been taking  daliresp for 2 weeks with no relief. [facility] Initial call taken by: Rickard Patience,  May 06, 2010 9:18 AM  Follow-up for Phone Call        Spoke with patient-wheezing, cough-wet and productive at times; not getting any better; pt was put on Daliresp-cant tell any difference. Pt has appt today with TP at 4pm.[facility] CMA  May 06, 2010 9:30 AM

## 2010-10-21 NOTE — Assessment & Plan Note (Signed)
Summary: HOSPITAL ADMISSION   Primary Provider/Referring Provider:  Duffy Rhody  CC:  saw CDY Monday - increased SOB, wheezing, prod cough with yellow mucus, and sinus pressure w/ yellow mucus - states she was better Tues and Wed but worsened again yesterday.  History of Present Illness: HOSPITAL ADMISSION  77 yo female w/ known hx of severe asthma , COPD and rheumatoid arthritis.  October 22, 2009- Severe asthma, COPD, PAF, AS, RA Hosp late December for exacerb COPD which cleared only slowly. She feels back to baseline finally. RUQ pain eval by Dr Marina Goodell w/ G B scan "gas". Currently no wheeze or cough. Some exertional dyspnea. Taking an antiviral pill for viral "fever blisters" on lips.  December 04, 2009- Severe asthma, COPD, PAF, Asthma, RA Hospital follow-up after admitted 2/16-18/11 with very rapid onset exacerbation. We treated with asthma without being clear if she might have aspirated or some other mechanism   Jan 20, 2010- Severe asthma, COPD, PAF, ASthma, RA Since last here she found knots in her legs, and Dr Marina Goodell dx'd phlebitis. He didn't like her breath sounds, got CXR showing upper right lung pneumonia- done at his office. He treated with Levaquin March 31. He also gave her prednisone.   March 31, 2010- Severe asthma, COPD, PAF, Asthma, RA Family had colds. 2 days ago she woke tight , weak and dyspneic. Cough productive thick yellow. sore throat, tussive headache. denies nausea. Yesterday she took 30 mg prednisone. Prior she had remained on 5 mg daily.Arrived here today with room air sat 66%, up to 91% on 4L.   April 04, 2010 -Returns for persistent symptoms of dyspnea, DOE, cough, wheezing. Seen 4 days ago tx w/ steroid burst, nebs. Some improvement for 2 days but yesterday symptoms worsened. Now cough is junky w/ thick yellow mucus, nasal drarinage. Wears out easily. She continues to desat in office today w/ O2 sat at 80%. on room air. With rest sats improve to 90%. Wheezing is  getting worse esp at night.  Denies chest pain,  orthopnea, hemoptysis, fever, n/v/d, edema, headache. We have discussed that she will require hospitalization for futher tx and evaluation.    Medications Prior to Update: 1)  Advair Diskus 250-50 Mcg/dose  Misc (Fluticasone-Salmeterol) .... Inhale 1 Puff Two Times A Day 2)  Proair Hfa 108 (90 Base) Mcg/act  Aers (Albuterol Sulfate) .... 2 Puffs 4 Times Daily If Needed 3)  Prednisone 5 Mg Tabs (Prednisone) .... 3-6 Tabs Daily When Needed. 4)  Mucinex Dm Maximum Strength 60-1200 Mg  Tb12 (Dextromethorphan-Guaifenesin) .... Take 1 Tablet By Mouth Two Times A Day 5)  Hydrochlorothiazide 25 Mg  Tabs (Hydrochlorothiazide) .Marland Kitchen.. 1 Tab By Mouth Once Daily 6)  Lexapro 10 Mg  Tabs (Escitalopram Oxalate) .... 1/2 Tab By Mouth Once Daily 7)  Ramipril 5 Mg Caps (Ramipril) .... Take 1 By Mouth Once Daily 8)  Vitamin D 16109 Unit Caps (Ergocalciferol) .... Take 1 By Mouth Weekly 9)  Protonix 40 Mg Tbec (Pantoprazole Sodium) .... Take 1 By Mouth Once Daily 10)  Xopenex 1.25 Mg/42ml Nebu (Levalbuterol Hcl) .Marland Kitchen.. 1 Vial Via [employer] Two Times A Day 11)  Prednisone 10 Mg Tabs (Prednisone) .Marland Kitchen.. 1 Tab Four Times Daily X 2 Days, 3 Times Daily X 2 Days, 2 Times Daily X 2 Days, 1 Time Daily X 2 Days  Current Medications (verified): 1)  Advair Diskus 250-50 Mcg/dose  Misc (Fluticasone-Salmeterol) .... Inhale 1 Puff Two Times A Day 2)  Proair Hfa 108 (90  Base) Mcg/act  Aers (Albuterol Sulfate) .... 2 Puffs 4 Times Daily If Needed 3)  Prednisone 5 Mg Tabs (Prednisone) .... 3-6 Tabs Daily When Needed. 4)  Mucinex Dm Maximum Strength 60-1200 Mg  Tb12 (Dextromethorphan-Guaifenesin) .... Take 1 Tablet By Mouth Two Times A Day 5)  Hydrochlorothiazide 25 Mg  Tabs (Hydrochlorothiazide) .Marland Kitchen.. 1 Tab By Mouth Once Daily 6)  Lexapro 10 Mg  Tabs (Escitalopram Oxalate) .... 1/2 Tab By Mouth Once Daily 7)  Ramipril 5 Mg Caps (Ramipril) .... Take 1 By Mouth Once Daily 8)  Vitamin D 16109 Unit  Caps (Ergocalciferol) .... Take 1 By Mouth Weekly 9)  Protonix 40 Mg Tbec (Pantoprazole Sodium) .... Take 1 By Mouth Once Daily 10)  Xopenex 1.25 Mg/52ml Nebu (Levalbuterol Hcl) .Marland Kitchen.. 1 Vial Via [employer] Two Times A Day 11)  Prednisone 10 Mg Tabs (Prednisone) .Marland Kitchen.. 1 Tab Four Times Daily X 2 Days, 3 Times Daily X 2 Days, 2 Times Daily X 2 Days, 1 Time Daily X 2 Days  Allergies (verified): 1)  ! * Diltiazem 2)  ! * Singulair  Past History:  Past Surgical History: Last updated: 06/04/2009 Hysterectomy Breast cyst  L5 diskectomy in 2003.   Family History: Last updated: 09-02-2009 Mother- deceased age 20;rheumatism, asthma Father- deceased age 66; emphysema 58 Brothers and Sisters- 1 with colon cancer and 1 deceased with heart trouble.  Brother- died Alzheimers  Social History: Last updated: 02/29/2008 Patient states former smoker.  Quit in 1991. Cares for husband (demented) Married with 2 children.  Risk Factors: Smoking Status: quit (03/31/2010)  Past Medical History: DERMATITIS (ICD-692.9) GERD (ICD-530.81) DEPRESSION (ICD-311) AORTIC STENOSIS (ICD-424.1) mild by echo 2008 PAROXYSMAL ATRIAL FIBRILLATION (ICD-427.31) - 11/08 ALLERGIC RHINITIS (ICD-477.9) RHEUMATOID ARTHRITIS (ICD-714.0)--chronic low dose prednisone at 5mg  once daily  HYPERTENSION (ICD-401.9) BRONCHITIS, OBSTRUCTIVE CHRONIC, ACUTE EXACERBATION (ICD-491.21) COLON CANCER, FAMILY HISTORY (ICD-V16.0) STASIS DERMATITIS (ICD-454.1) MULTIPLE SCLEROSIS (ICD-340) COPD (ICD-496) ASTHMA, CHRONIC OBSTRUCTIVE NOS (ICD-493.20) PFT  severe COPD--7/10>>FEV1 39%, ratio of 31  Hypertension Rheumatoid Arthritis  Acute urinary infection not otherwise specified.   Review of Systems  The patient denies anorexia, fever, weight loss, weight gain, vision loss, decreased hearing, hoarseness, chest pain, syncope, peripheral edema, headaches, hemoptysis, abdominal pain, melena, hematochezia, severe indigestion/heartburn, hematuria,  incontinence, muscle weakness, transient blindness, difficulty walking, depression, unusual weight change, abnormal bleeding, enlarged lymph nodes, and angioedema.    Vital Signs:  Patient profile:   77 year old female Height:      63 inches Weight:      143 pounds BMI:     25.42 O2 Sat:      90 % on Room air Temp:     97.2 degrees F oral Pulse rate:   103 / minute BP sitting:   106 / 62  (left arm) Cuff size:   regular  Vitals Entered By: Boone Master CNA/MA (April 04, 2010 12:09 PM)  O2 Flow:  Room air CC: saw CDY Monday - increased SOB, wheezing, prod cough with yellow mucus, sinus pressure w/ yellow mucus - states she was better Tues and Wed but worsened again yesterday Is Patient Diabetic? No Comments Medications reviewed with patient Daytime contact number verified with patient. Anuja Manka CNA/MA  April 04, 2010 12:09 PM    Physical Exam  Additional Exam:  General: A/Ox3; pleasant in NAD,  SKIN: clear w/ no rash.  NODES: no lymphadenopathy noted.  HEENT: [place]/AT, EOM- WNL, Conjuctivae- clear, PERRLA, TM, clear left, , Nose- edema and blowing, Throat- clear and wnl-  not red,  Mallampati II NECK: Supple w/ fair ROM, JVD- none, normal carotid impulses w/o bruits Thyroid-  CHEST: Ins/exp wheezing w/ upper airway psuedowheezing. . Able to talk in full sentences. no stridor.  HEART: RRR, no m/g/r heard ABDOMEN: soft ZDG:LOVF, nl pulses, no edema . Thin calves. NEURO: Grossly intact to observation      Impression & Recommendations:  Problem # 1:  BRONCHITIS, OBSTRUCTIVE CHRONIC, ACUTE EXACERBATION (ICD-491.21) Slow to resovle COPD /AB exacerbation w/ Outpatient failure.  Now w/ desaturations w/ activity.  Will admit , begin on IV abx, steroids and Nebs.  Heparin for DVT prophylaxis.  PPI for GI prophylasxis.  CXR , labs pending.   Problem # 2:  HYPERTENSION (ICD-401.9) b/p on low normal side Would hold for now, reevaluate in am, and restart as indicated.  Would  consider alternative to ACE inhibitor in pt w/ underlying lung dz/ashtma.   Her updated medication list for this problem includes:    Hydrochlorothiazide 25 Mg Tabs (Hydrochlorothiazide) .Marland Kitchen... 1 tab by mouth once daily    Ramipril 5 Mg Caps (Ramipril) .Marland Kitchen... Take 1 by mouth once daily  BP today: 106/62 Prior BP: 140/80 (03/31/2010)  Prior 10 Yr Risk Heart Disease: Not enough information (08/27/2009)  Problem # 3:  RHEUMATOID ARTHRITIS (ICD-714.0) On chronic low dose steroids w/ prednisone 5mg  once daily   Complete Medication List: 1)  Advair Diskus 250-50 Mcg/dose Misc (Fluticasone-salmeterol) .... Inhale 1 puff two times a day 2)  Proair Hfa 108 (90 Base) Mcg/act Aers (Albuterol sulfate) .... 2 puffs 4 times daily if needed 3)  Prednisone 5 Mg Tabs (Prednisone) .... 3-6 tabs daily when needed. 4)  Mucinex Dm Maximum Strength 60-1200 Mg Tb12 (Dextromethorphan-guaifenesin) .... Take 1 tablet by mouth two times a day 5)  Hydrochlorothiazide 25 Mg Tabs (Hydrochlorothiazide) .Marland Kitchen.. 1 tab by mouth once daily 6)  Lexapro 10 Mg Tabs (Escitalopram oxalate) .... 1/2 tab by mouth once daily 7)  Ramipril 5 Mg Caps (Ramipril) .... Take 1 by mouth once daily 8)  Vitamin D 64332 Unit Caps (Ergocalciferol) .... Take 1 by mouth weekly 9)  Protonix 40 Mg Tbec (Pantoprazole sodium) .... Take 1 by mouth once daily 10)  Xopenex 1.25 Mg/61ml Nebu (Levalbuterol hcl) .Marland Kitchen.. 1 vial via neb two times a day 11)  Prednisone 10 Mg Tabs (Prednisone) .Marland Kitchen.. 1 tab four times daily x 2 days, 3 times daily x 2 days, 2 times daily x 2 days, 1 time daily x 2 days  Other Orders: No Charge Patient Arrived (NCPA0) (NCPA0)  Patient Instructions: 1)  Go to [facility] .for admission.   Appended Document: Orders Update - neb tx    Clinical Lists Changes  Orders: Added new Service order of Nebulizer Tx ([id number]) - Signed

## 2010-10-21 NOTE — Assessment & Plan Note (Signed)
Summary: Acute NP follow up - bronchitis   Primary Provider/Referring Provider:  Duffy Rhody  CC:  wheezing, increased SOB, and prod cough with yellow mucus - states symptoms have not improved since last ov 8.1.11.  History of Present Illness: Jan 20, 2010- Severe asthma, COPD, PAF, ASthma, RA Since last here she found knots in her legs, and Dr Marina Goodell dx'd phlebitis. He didn't like her breath sounds, got CXR showing upper right lung pneumonia- done at his office. He treated with Levaquin March 31. He also gave her prednisone.   March 31, 2010- Severe asthma, COPD, PAF, Asthma, RA Family had colds. 2 days ago she woke tight , weak and dyspneic. Cough productive thick yellow. sore throat, tussive headache. denies nausea. Yesterday she took 30 mg prednisone. Prior she had remained on 5 mg daily.Arrived here today with room air sat 66%, up to 91% on 4L.   April 04, 2010 -Returns for persistent symptoms of dyspnea, DOE, cough, wheezing. Seen 4 days ago tx w/ steroid burst, nebs. Some improvement for 2 days but yesterday symptoms worsened. Now cough is junky w/ thick yellow mucus, nasal drarinage. Wears out easily. She continues to desat in office today w/ O2 sat at 80%. on room air. With rest sats improve to 90%. Wheezing is getting worse esp at night.  Denies chest pain,  orthopnea, hemoptysis, fever, n/v/d, edema, headache. We have discussed that she will require hospitalization for futher tx and evaluation.    April 21, 2010- COPD, severe asthma, PAF, RA Hospital for a viral triggered exacerbation 7/15-22/11. She has tapered down on steroids. She tripped at store yesterday, hit face and broke glasses. Persistent nasl drainage and cough, with slow improvement in her breathing since the hospitial. Breathing more deeply. Cough produces scant clear mucus. PFT review has shown severe baseline COPD.  May 06, 2010 --Presents for return for persistent symptoms. She complains that her  wheezing,  increased SOB, prod cough with yellow mucus - states symptoms have not improved since last ov 8.1.11. Her cough is worse now with discolored mucus. She was started on Daliresp last visit. Denies chest pain,  orthopnea, hemoptysis, fever, n/v/d, edema, headache/  May 06, 2010-- wheezing, increased SOB, prod cough with yellow mucus - states symptoms have not improved since last ov 8.1.11  Medications Prior to Update: 1)  Advair Diskus 250-50 Mcg/dose  Misc (Fluticasone-Salmeterol) .... Inhale 1 Puff Two Times A Day 2)  Proair Hfa 108 (90 Base) Mcg/act  Aers (Albuterol Sulfate) .... 2 Puffs 4 Times Daily If Needed 3)  Prednisone 5 Mg Tabs (Prednisone) .... 3-6 Tabs Daily When Needed. 4)  Mucinex Dm Maximum Strength 60-1200 Mg  Tb12 (Dextromethorphan-Guaifenesin) .... Take 1 Tablet By Mouth Two Times A Day 5)  Hydrochlorothiazide 25 Mg  Tabs (Hydrochlorothiazide) .Marland Kitchen.. 1 Tab By Mouth Once Daily 6)  Lexapro 10 Mg  Tabs (Escitalopram Oxalate) .... 1/2 Tab By Mouth Once Daily 7)  Ramipril 5 Mg Caps (Ramipril) .... Take 1 By Mouth Once Daily 8)  Protonix 40 Mg Tbec (Pantoprazole Sodium) .... Take 1 By Mouth Once Daily 9)  Xopenex 1.25 Mg/50ml Nebu (Levalbuterol Hcl) .Marland Kitchen.. 1 Vial Via [employer] Two Times A Day 10)  Losartan Potassium 25 Mg Tabs (Losartan Potassium) .... Take 1 Tablet By Mouth Once A Day 11)  Daliresp 500 Mcg .Marland KitchenMarland Kitchen. 1 Daily  Current Medications (verified): 1)  Advair Diskus 250-50 Mcg/dose  Misc (Fluticasone-Salmeterol) .... Inhale 1 Puff Two Times A Day 2)  Proair Hfa  108 (90 Base) Mcg/act  Aers (Albuterol Sulfate) .... 2 Puffs 4 Times Daily If Needed 3)  Prednisone 5 Mg Tabs (Prednisone) .... 3-6 Tabs Daily When Needed. 4)  Mucinex Dm Maximum Strength 60-1200 Mg  Tb12 (Dextromethorphan-Guaifenesin) .... Take 1 Tablet By Mouth Two Times A Day 5)  Hydrochlorothiazide 25 Mg  Tabs (Hydrochlorothiazide) .Marland Kitchen.. 1 Tab By Mouth Once Daily 6)  Lexapro 10 Mg  Tabs (Escitalopram Oxalate) .... 1/2 Tab By  Mouth Once Daily 7)  Ramipril 5 Mg Caps (Ramipril) .... Take 1 By Mouth Once Daily 8)  Protonix 40 Mg Tbec (Pantoprazole Sodium) .... Take 1 By Mouth Once Daily 9)  Xopenex 1.25 Mg/69ml Nebu (Levalbuterol Hcl) .Marland Kitchen.. 1 Vial Via [employer] Two Times A Day 10)  Losartan Potassium 25 Mg Tabs (Losartan Potassium) .... Take 1 Tablet By Mouth Once A Day 11)  Daliresp 500 Mcg .Marland KitchenMarland Kitchen. 1 Daily 12)  Oxygen .... Wear 2l/min As Needed With Activity and At Bedtime  Allergies (verified): 1)  ! * Diltiazem 2)  ! * Singulair  Past History:  Past Medical History: Last updated: 04/04/2010 DERMATITIS (ICD-692.9) GERD (ICD-530.81) DEPRESSION (ICD-311) AORTIC STENOSIS (ICD-424.1) mild by echo 2008 PAROXYSMAL ATRIAL FIBRILLATION (ICD-427.31) - 11/08 ALLERGIC RHINITIS (ICD-477.9) RHEUMATOID ARTHRITIS (ICD-714.0)--chronic low dose prednisone at 5mg  once daily  HYPERTENSION (ICD-401.9) BRONCHITIS, OBSTRUCTIVE CHRONIC, ACUTE EXACERBATION (ICD-491.21) COLON CANCER, FAMILY HISTORY (ICD-V16.0) STASIS DERMATITIS (ICD-454.1) MULTIPLE SCLEROSIS (ICD-340) COPD (ICD-496) ASTHMA, CHRONIC OBSTRUCTIVE NOS (ICD-493.20) PFT  severe COPD--7/10>>FEV1 39%, ratio of 31  Hypertension Rheumatoid Arthritis  Acute urinary infection not otherwise specified.   Past Surgical History: Last updated: 06/04/2009 Hysterectomy Breast cyst  L5 diskectomy in 2003.   Family History: Last updated: 2009-09-09 Mother- deceased age 49;rheumatism, asthma Father- deceased age 44; emphysema 83 Brothers and Sisters- 1 with colon cancer and 1 deceased with heart trouble.  Brother- died Alzheimers  Social History: Last updated: 02/29/2008 Patient states former smoker.  Quit in 1991. Cares for husband (demented) Married with 2 children.  Risk Factors: Smoking Status: quit (04/21/2010) Packs/Day: 1.0 (04/21/2010)  Review of Systems      See HPI  Vital Signs:  Patient profile:   77 year old female Height:      63 inches Weight:       143.19 pounds O2 Sat:      90 % Temp:     97.3 degrees F oral Pulse rate:   96 / minute Resp:     16 per minute BP sitting:   130 / 78  Vitals Entered By: Boone Master CNA/MA (May 06, 2010 3:49 PM) CC: wheezing, increased SOB, prod cough with yellow mucus - states symptoms have not improved since last ov 8.1.11 Is Patient Diabetic? No Comments Medications reviewed with patient Daytime contact number verified with patient. Dreonna Hussein CNA/MA  May 06, 2010 3:49 PM    Physical Exam  Additional Exam:  General: A/Ox3; pleasant in NAD, SKIN: clear w/ no rash.  NODES: no lymphadenopathy noted.  HEENT: [place]/AT, EOM- WNL, Conjuctivae- clear, PERRLA, TM, clear left, , Nose- edema and blowing, Throat- clear and wnl- not red,  Mallampati II NECK: Supple w/ fair ROM, JVD- none, normal carotid impulses w/o bruits Thyroid-  CHEST:coarse BS with very faint exp wheeze HEART: RRR, no m/g/r heard ABDOMEN: soft EXB:MWUX, nl pulses, no edema . Thin calves. NEURO: Grossly intact to observation      Impression & Recommendations:  Problem # 1:  BRONCHITIS, OBSTRUCTIVE CHRONIC, ACUTE EXACERBATION (ICD-491.21) Slow to resolve AEAB- REC  Levaquin 500mg  once daily  for 7 days  Increase Prednisone 20mg  once daily for 1 week then 10mg  once daily -and hold  follow up 2 weeks Dr. Maple Hudson or Shaquita Fort Please contact office for sooner follow up if symptoms do not improve or worsen   Problem # 2:  HYPERTENSION (ICD-401.9) prev. taken off ace inhib. during hospital stay , however on med list today she is going to check when she goes home but believes she has stopped this.  The following medications were removed from the medication list:    Ramipril 5 Mg Caps (Ramipril) .Marland Kitchen... Take 1 by mouth once daily Her updated medication list for this problem includes:    Hydrochlorothiazide 25 Mg Tabs (Hydrochlorothiazide) .Marland Kitchen... 1 tab by mouth once daily    Losartan Potassium 25 Mg Tabs (Losartan potassium) .Marland Kitchen...  Take 1 tablet by mouth once a day  Medications Added to Medication List This Visit: 1)  Oxygen  .... Wear 2l/min as needed with activity and at bedtime 2)  Levaquin 500 Mg Tabs (Levofloxacin) .Marland Kitchen.. 1 by mouth once daily  Complete Medication List: 1)  Advair Diskus 250-50 Mcg/dose Misc (Fluticasone-salmeterol) .... Inhale 1 puff two times a day 2)  Proair Hfa 108 (90 Base) Mcg/act Aers (Albuterol sulfate) .... 2 puffs 4 times daily if needed 3)  Prednisone 5 Mg Tabs (Prednisone) .... 3-6 tabs daily when needed. 4)  Mucinex Dm Maximum Strength 60-1200 Mg Tb12 (Dextromethorphan-guaifenesin) .... Take 1 tablet by mouth two times a day 5)  Hydrochlorothiazide 25 Mg Tabs (Hydrochlorothiazide) .Marland Kitchen.. 1 tab by mouth once daily 6)  Lexapro 10 Mg Tabs (Escitalopram oxalate) .... 1/2 tab by mouth once daily 7)  Protonix 40 Mg Tbec (Pantoprazole sodium) .... Take 1 by mouth once daily 8)  Xopenex 1.25 Mg/11ml Nebu (Levalbuterol hcl) .Marland Kitchen.. 1 vial via neb two times a day 9)  Losartan Potassium 25 Mg Tabs (Losartan potassium) .... Take 1 tablet by mouth once a day 10)  Daliresp 500 Mcg  .Marland Kitchen.. 1 daily 11)  Oxygen  .... Wear 2l/min as needed with activity and at bedtime 12)  Levaquin 500 Mg Tabs (Levofloxacin) .Marland Kitchen.. 1 by mouth once daily  Other Orders: Est. Patient Level IV (01601)  Patient Instructions: 1)  Make sure you are not taking Ramipril.  2)  You are suppose to be taking Losartan 25mg  once daily  3)  Levaquin 500mg  once daily  for 7 days  4)  Increase Prednisone 20mg  once daily for 1 week then 10mg  once daily -and hold  5)  follow up 2 weeks Dr. Maple Hudson or Dagny Fiorentino 6)  Please contact office for sooner follow up if symptoms do not improve or worsen  Prescriptions: LEVAQUIN 500 MG TABS (LEVOFLOXACIN) 1 by mouth once daily  #7 x 0   Entered and Authorized by:   Rubye Oaks NP   Signed by:   Aldene Hendon NP on 05/06/2010   Method used:   Print then Give to Patient   RxID:   [id number]

## 2010-10-21 NOTE — Progress Notes (Signed)
Summary: rx-LMTCB  Phone Note Call from Patient Call back at Home Phone [phone]   Caller: Patient Call For: young Reason for Call: Talk to Nurse Summary of Call: recently out of Hosp.  Sob, just going to bathroom, coughing up plegm, pale yellow.  Just feel worse since out of hosp.  Please advise. Initial call taken by: Eugene Gavia,  September 30, 2009 9:13 AM  Follow-up for Phone Call        Spoke with pt; aware that CDY is out of office til am and to go to ER if things get worse during the day/night;pt stated that she would call EMS if needed. Reynaldo Minium CMA  September 30, 2009 9:23 AM   Forwarding message to [facility] for review. Reynaldo Minium CMA  September 30, 2009 9:23 AM   Additional Follow-up for Phone Call Additional follow up Details #1::        Try staying on prednisone 40 mg daily. We can send her Tussionex if she needs a cough syrup to try. I've written script. Additional Follow-up by: Waymon Budge MD,  October 02, 2009 1:31 PM    Additional Follow-up for Phone Call Additional follow up Details #2::    Triage has RX.Reynaldo Minium CMA  October 03, 2009 10:06 AM   called spoke with patient.  she states that she does feel better since Monday, but states she has been itching "all over" with a slight rash since being discharged from the hosp.  asked pt if she was started on any new meds, which she stated she began pantoprazole but has stopped this med x2days with no relief.  she would like to know if any her meds could cause the itching.  she is also seeing her PCP this afternoon and will ask him as well.  pt denies any change in fabric detergents, etc.  pt will continue the prednisone 40mg  daily as directed.  tussionex called into pharmacy.  pt verbalized her understanding. Follow-up by: Boone Master CNA,  October 03, 2009 11:17 AM  Additional Follow-up for Phone Call Additional follow up Details #3:: Details for Additional Follow-up Action Taken: There are some meds that  could make her itch including the Tussionex; but patient needs to breathe so she should stay on meds as long as the itch isnt too bothersome and try taking Loratadine for the itch.Reynaldo Minium CMA  October 03, 2009 1:23 PM  LMTCB. Carron Curie CMA  October 03, 2009 2:42 PM   Spoke with the pt and made aware of the above recs.  She states that the itch is "not to bothersome" and will take the meds CDY prescribed. She states will try loratidine as needed.  Additional Follow-up by: Vernie Murders,  October 03, 2009 3:15 PM  New/Updated Medications: Sandria Senter ER 8-10 MG/5ML LQCR (CHLORPHENIRAMINE-HYDROCODONE) 1 teaspoon two times a day as needed cough Prescriptions: TUSSIONEX PENNKINETIC ER 8-10 MG/5ML LQCR (CHLORPHENIRAMINE-HYDROCODONE) 1 teaspoon two times a day as needed cough  #200 ml x 0   Entered and Authorized by:   Waymon Budge MD   Signed by:   Waymon Budge MD on 10/02/2009   Method used:   Print then Give to Patient   RxID:   [id number]

## 2010-10-23 NOTE — Assessment & Plan Note (Signed)
Summary: Acute NP office visit - asthma   Primary Provider/Referring Provider:  Duffy Rhody  CC:  chest congestion, wheezing, DOE, cough with small amounts of mucus, sweats x1week, and worse x3days.  History of Present Illness: May 23, 2010- Chronic obstructive asthma, PAF, RA Cough finally resolved as she finished antibiotic Levaquin from last visit. Now clear almost 2 weeks. Insurance wouldn't cover Daliresp. She isn't sure whether it was helping or not. We discussed it and she decided she wanted to self pay and try it another month.  June 27, 2010  Chronic obstructive asthma, PAF, RA cc: Acute Visit-SOB, dizziness, tightness in chest. Note tachycardia on arrival 129/min. We had called biaxin and prednisone for productive cough and finishing that she felt very well. Went to [employer] with sister. On return began feeling weak and aware of rapid pulse with no chest pain. Head stopped up lying in bed with oxygen last night. Denies any tightness, sweling or aching in legs.  Chest feels tight with no cough or phlegm. Denies blood, fever, chills, sore throat, chest pain, leg pain or swelling. EKG- sinus tach 109. Pulmonary disease pattern.  July 22, 2010- Chronic obstructive asthma, PAF, RA,  .Nurse CC: 3 month follow up visit-COPD; cough-non productive, slight wheezing, SOB. Feeling more wheeze in the last 2 days, attributed to the weather change. "Just feel bad". Pains along lateral aspect of right uper and lower leg limit walking when she first gets up. Had surgery before for similar nerve pain in the other leg. I asked her to contact Dr Marina Goodell for this, as her PCP.  She thinks Daliresp has helped some and asks more samples. Denies significant GI upset.  September 29, 2010 --Presents for an acute office visit.Complains of chest congestion, wheezing, DOE, cough with small amounts of mucus, sweats x1week, worse x3days. Has had on /off symptoms x 3 weeks. Worse  x 3 days. Denies chest pain,  dyspnea, orthopnea, hemoptysis, fever, n/v/d, edema, headache,recent travel or antibiotics.     Medications Prior to Update: 1)  Advair Diskus 250-50 Mcg/dose  Misc (Fluticasone-Salmeterol) .... Inhale 1 Puff Two Times A Day 2)  Hydrochlorothiazide 25 Mg  Tabs (Hydrochlorothiazide) .Marland Kitchen.. 1 Tab By Mouth Once Daily 3)  Lexapro 10 Mg  Tabs (Escitalopram Oxalate) .... 1/2 Tab By Mouth Once Daily 4)  Protonix 40 Mg Tbec (Pantoprazole Sodium) .... Take 1 By Mouth Once Daily 5)  Xopenex 1.25 Mg/54ml Nebu (Levalbuterol Hcl) .Marland Kitchen.. 1 Vial Via [employer] Two Times A Day 6)  Losartan Potassium 25 Mg Tabs (Losartan Potassium) .... Take 1 Tablet By Mouth Once A Day 7)  Daliresp 500 Mcg .Marland KitchenMarland Kitchen. 1 Daily 8)  Oxygen .... Wear 2l/min As Needed With Activity and At Bedtime 9)  Proair Hfa 108 (90 Base) Mcg/act  Aers (Albuterol Sulfate) .... 2 Puffs 4 Times Daily If Needed 10)  Mucinex Dm Maximum Strength 60-1200 Mg  Tb12 (Dextromethorphan-Guaifenesin) .... Take 1 Tablet By Mouth Two Times A Day 11)  Prednisone 10 Mg Tabs (Prednisone) .Marland Kitchen.. 1 Tab Four Times Daily X 2 Days, 3 Times Daily X 2 Days, 2 Times Daily X 2 Days, 1 Time Daily X 2 Days  Current Medications (verified): 1)  Advair Diskus 250-50 Mcg/dose  Misc (Fluticasone-Salmeterol) .... Inhale 1 Puff Two Times A Day 2)  Proair Hfa 108 (90 Base) Mcg/act  Aers (Albuterol Sulfate) .... 2 Puffs 4 Times Daily If Needed 3)  Mucinex Dm Maximum Strength 60-1200 Mg  Tb12 (Dextromethorphan-Guaifenesin) .... Take 1  Tablet By Mouth Two Times A Day 4)  Hydrochlorothiazide 25 Mg  Tabs (Hydrochlorothiazide) .Marland Kitchen.. 1 Tab By Mouth Once Daily 5)  Lexapro 10 Mg  Tabs (Escitalopram Oxalate) .... 1/2 Tab By Mouth Once Daily 6)  Protonix 40 Mg Tbec (Pantoprazole Sodium) .... Take 1 By Mouth Once Daily 7)  Xopenex 1.25 Mg/53ml Nebu (Levalbuterol Hcl) .Marland Kitchen.. 1 Vial Via [employer] Two Times A Day 8)  Losartan Potassium 25 Mg Tabs (Losartan Potassium) .... Take 1 Tablet By Mouth Once A Day 9)  Daliresp 500  Mcg .Marland KitchenMarland Kitchen. 1 Daily 10)  Oxygen .... Wear 2l/min As Needed With Activity and At Bedtime  Allergies (verified): 1)  ! * Diltiazem 2)  ! * Singulair  Past History:  Past Medical History: Last updated: 04/04/2010 DERMATITIS (ICD-692.9) GERD (ICD-530.81) DEPRESSION (ICD-311) AORTIC STENOSIS (ICD-424.1) mild by echo 2008 PAROXYSMAL ATRIAL FIBRILLATION (ICD-427.31) - 11/08 ALLERGIC RHINITIS (ICD-477.9) RHEUMATOID ARTHRITIS (ICD-714.0)--chronic low dose prednisone at 5mg  once daily  HYPERTENSION (ICD-401.9) BRONCHITIS, OBSTRUCTIVE CHRONIC, ACUTE EXACERBATION (ICD-491.21) COLON CANCER, FAMILY HISTORY (ICD-V16.0) STASIS DERMATITIS (ICD-454.1) MULTIPLE SCLEROSIS (ICD-340) COPD (ICD-496) ASTHMA, CHRONIC OBSTRUCTIVE NOS (ICD-493.20) PFT  severe COPD--7/10>>FEV1 39%, ratio of 31  Hypertension Rheumatoid Arthritis  Acute urinary infection not otherwise specified.   Past Surgical History: Last updated: 06/04/2009 Hysterectomy Breast cyst  L5 diskectomy in 2003.   Family History: Last updated: 09-24-09 Mother- deceased age 60;rheumatism, asthma Father- deceased age 23; emphysema 41 Brothers and Sisters- 1 with colon cancer and 1 deceased with heart trouble.  Brother- died Alzheimers  Social History: Last updated: 02/29/2008 Patient states former smoker.  Quit in 1991. Cares for husband (demented) Married with 2 children.  Risk Factors: Smoking Status: quit > 6 months (07/22/2010) Packs/Day: 2.0 (07/22/2010)  Review of Systems      See HPI  Vital Signs:  Patient profile:   77 year old female Height:      63 inches Weight:      141.25 pounds BMI:     25.11 O2 Sat:      93 % on Room air Temp:     98.2 degrees F oral Pulse rate:   102 / minute BP sitting:   130 / 88  (left arm) Cuff size:   regular  Vitals Entered By: Boone Master CNA/MA (September 29, 2010 2:13 PM)  O2 Flow:  Room air CC: chest congestion, wheezing, DOE, cough with small amounts of mucus, sweats  x1week, worse x3days Is Patient Diabetic? No Comments Medications reviewed with patient Daytime contact number verified with patient. Boone Master CNA/MA  September 29, 2010 2:15 PM    Physical Exam  Additional Exam:  General: A/Ox3; pleasant in NAD, SKIN: clear w/ no rash.  NODES: no lymphadenopathy noted.  HEENT: [place]/AT, EOM- WNL, Conjuctivae- clear, PERRLA, TM, clear left, , Nose- clear drainage  Throat- clear  Mallampati II NECK: Supple w/ fair ROM, JVD- none, normal carotid impulses w/o bruits Thyroid-  CHEST coarse BS w/ faint exp wheezing , upper airway psuedowheezing  HEART: regular  ABDOMEN: soft GMW:NUUV, nl pulses, no edema . Thin calves, negative Homan's, no cyanosis or clubbing NEURO: Grossly intact to observation      Impression & Recommendations:  Problem # 1:  BRONCHITIS, OBSTRUCTIVE CHRONIC, ACUTE EXACERBATION (ICD-491.21)  Doxycyline 100mg  two times a day for 7 days Mucinex DM two times a day as needed cough/congestion  Increase Prednisone 40mg  once daily x 3 days, 30mg  once daily x 3 days, 20mg  once daily x 3 days and  then 10mg  once daily  Please contact office for sooner follow up if symptoms do not improve or worsen  follow up Dr. Maple Hudson as planned   Orders: Est. Patient Level III (16109)  Medications Added to Medication List This Visit: 1)  Prednisone 10 Mg Tabs (Prednisone) .Marland Kitchen.. 1 by mouth once daily 2)  Brov[place] 15 Mcg/34ml Nebu (Arformoterol tartrate) .... Neb two times a day 3)  Prednisone 10 Mg Tabs (Prednisone) .... 40mg  once daily x 3 days, 30mg  once daily x 3 days, 20mg  once daily x 3 days and then 10mg  once daily 4)  Doxycycline Hyclate 100 Mg Caps (Doxycycline hyclate) .Marland Kitchen.. 1 by mouth two times a day  Patient Instructions: 1)  Doxycyline 100mg  two times a day for 7 days 2)  Mucinex DM two times a day as needed cough/congestion  3)  Increase Prednisone 40mg  once daily x 3 days, 30mg  once daily x 3 days, 20mg  once daily x 3 days and then 10mg   once daily  4)  Please contact office for sooner follow up if symptoms do not improve or worsen  5)  follow up Dr. Maple Hudson as planned  Prescriptions: DOXYCYCLINE HYCLATE 100 MG CAPS (DOXYCYCLINE HYCLATE) 1 by mouth two times a day  #14 x 0   Entered and Authorized by:   Rubye Oaks NP   Signed by:   Lue Sykora NP on 09/29/2010   Method used:   Print then Give to Patient   RxID:   [id number] PREDNISONE 10 MG TABS (PREDNISONE) 40mg  once daily x 3 days, 30mg  once daily x 3 days, 20mg  once daily x 3 days and then 10mg  once daily  #30 x 0   Entered and Authorized by:   Rubye Oaks NP   Signed by:   Kadden Osterhout NP on 09/29/2010   Method used:   Print then Give to Patient   RxID:   [id number]    Immunization History:  Influenza Immunization History:    Influenza:  historical (06/21/2010)

## 2010-10-23 NOTE — Progress Notes (Signed)
Summary: samples / rx too expensive  Phone Note Call from Patient Call back at Home Phone [phone]   Caller: Patient Call For: Jerome Viglione Summary of Call: daliresp working well. however, rx costs 200.00. pt can't afford this. can pt pick up samples this morning? she will be in town around 69 today.  [facility] uses [facility] Initial call taken by: Tivis Ringer, CNA,  September 01, 2010 9:25 AM  Follow-up for Phone Call        Spoke with pt and she states dailesp is working well but it is too expensive at 200 per moth.  I advised pt of patient assistance program fro dailresp. I gave pt samples as well as paperwork and advised her to com[plete paperwork and return to [facility] nurse. pt staes understanding.Carron Curie CMA  September 01, 2010 11:20 AM

## 2010-10-23 NOTE — Progress Notes (Signed)
Summary: sick - no better<<<Prednisone RX  Phone Note Call from Patient Call back at [facility] Phone [phone]   Caller: Patient Call For: young Reason for Call: Talk to Nurse Summary of Call: Patient saw Tammy P. on Jan. 9th.  She was told contact office if symptoms do not improve.  Patient calling saying she is still congested w/ cough, chest tightness. Requesting to speak to nurse Initial call taken by: Lehman Prom,  October 06, 2010 9:26 AM  Follow-up for Phone Call        pt called again. wants a call back asap as her spouse is leaving home soon as pt wants to go with him. pt c/o SOB as well. [facility]. Tivis Ringer, CNA  October 06, 2010 11:19 AM  Additional Follow-up for Phone Call Additional follow up Details #1::        Called, spoke with pt.  She was seen on 09/29/10 by TP and given prednisone taper and doxy.  She finished doxy this am and is on 2 tablets once daily of prednsione.  She has not improved any but is no worse either.  Still having chest congestion, wheezing, chest tightness, DOE, and coughs very little - nonprod.  She is ? if this could be from [facility] which she was recently started on and reqeusting CY's recs.    Allergies (verified):  ! * DILTIAZEM ! * SINGULAIR  [facility] **Pt request to pls leave detailed message on her answering machine if she does not answer.  Dr. Maple Hudson, pls advise.  Thanks!  Additional Follow-up by: Gweneth Dimitri RN,  October 06, 2010 11:27 AM    Additional Follow-up for Phone Call Additional follow up Details #2::    Per CDY, ok to stop daliresp if not clearly helping.  Doubt it is the cause, since she has had these spells before.  Best tool would be 30-40mg  pred/day for now until clearing.  Ok to call in prednsione 10mg  #100.  Pt to call back if sxs worsen or do not get better. Gweneth Dimitri RN  October 06, 2010 12:47 PM   [facility].Michel Bickers [facility]  October 06, 2010 12:55 PM  Patient had verbal understanding to stop  Daliresp and that she should take Prednisone daily, 30mg  - 40mg  until she has improved.  She is scheduled to see CDY on 10/20/2010 but will call for sooner appt if things do not improve or get worse. RX for Prednisone sent to [facility]. Follow-up by: Michel Bickers CMA,  October 06, 2010 2:32 PM  New/Updated Medications: PREDNISONE 10 MG TABS (PREDNISONE) 3-4 by mouth daily Prescriptions: PREDNISONE 10 MG TABS (PREDNISONE) 3-4 by mouth daily  #100 x 0   Entered by:   Michel Bickers CMA   Authorized by:   Waymon Budge MD   Signed by:   Michel Bickers CMA on 10/06/2010   Method used:   Electronically to        [facility] (retail)       [address]       [place], [place]  [address]       Ph: [phone] or [phone]       Fax: [phone]   RxID:   [id number]

## 2010-10-23 NOTE — Progress Notes (Signed)
Summary: Breathing problem  Phone Note Call from Patient Call back at Home Phone [phone]   Caller: Patient Summary of Call: Patient cannot get any air.  Taking more Prednisone for past 2 days, but not helping.  Getting worse and needs to be seen. Initial call taken by: Leonette Monarch,  September 29, 2010 8:57 AM  Follow-up for Phone Call        Spoke with pt.  She is c/o increased SOB x 2-3 days- she states that she is SOB "all the time"- with or without exertion.  She is also wheezing more and has a dry cough.  She is requesting ov with CDY for today, she does not feel this can wait another day.  Nothing open.  Pls advise thanks! allergic to diltiazem and singulair Follow-up by: Vernie Murders,  September 29, 2010 9:35 AM  Additional Follow-up for Phone Call Additional follow up Details #1::        Spoke with pt and scheduled an appt with T. Parrett today at 2 pm , per Dr. Tamera Stands RCP, LPN  September 29, 2010 10:37 AM

## 2010-10-24 NOTE — Medication Information (Signed)
Summary: [facility]  [facility]   Imported By: Sherian Rein 08/07/2010 12:56:35  _____________________________________________________________________  External Attachment:    Type:   Image     Comment:   External Document

## 2010-10-27 ENCOUNTER — Ambulatory Visit (INDEPENDENT_AMBULATORY_CARE_PROVIDER_SITE_OTHER): Payer: Medicare Other | Admitting: Internal Medicine

## 2010-10-27 ENCOUNTER — Encounter: Payer: Self-pay | Admitting: Internal Medicine

## 2010-10-27 DIAGNOSIS — J449 Chronic obstructive pulmonary disease, unspecified: Secondary | ICD-10-CM

## 2010-10-27 DIAGNOSIS — J309 Allergic rhinitis, unspecified: Secondary | ICD-10-CM

## 2010-10-27 DIAGNOSIS — J441 Chronic obstructive pulmonary disease with (acute) exacerbation: Secondary | ICD-10-CM

## 2010-10-28 ENCOUNTER — Telehealth (INDEPENDENT_AMBULATORY_CARE_PROVIDER_SITE_OTHER): Payer: Self-pay | Admitting: *Deleted

## 2010-10-29 NOTE — Assessment & Plan Note (Signed)
Summary: rov 3 months///kp   Primary Provider/Referring Provider:  Duffy Rhody  CC:  3 month follow up visit-wheezing and SOB.Marland Kitchen  History of Present Illness: July 22, 2010- Chronic obstructive asthma, PAF, RA,  .Nurse CC: 3 month follow up visit-COPD; cough-non productive, slight wheezing, SOB. Feeling more wheeze in the last 2 days, attributed to the weather change. "Just feel bad". Pains along lateral aspect of right uper and lower leg limit walking when she first gets up. Had surgery before for similar nerve pain in the other leg. I asked her to contact Dr Marina Goodell for this, as her PCP.  She thinks Daliresp has helped some and asks more samples. Denies significant GI upset.  September 29, 2010 --Presents for an acute office visit.Complains of chest congestion, wheezing, DOE, cough with small amounts of mucus, sweats x1week, worse x3days. Has had on /off symptoms x 3 weeks. Worse  x 3 days. Denies chest pain, dyspnea, orthopnea, hemoptysis, fever, n/v/d, edema, headache,recent travel or antibiotics.    October 20, 2010- Chronic obstructive asthma, PAF, RA,  Nurse-CC: 3 month follow up visit-wheezing and SOB. Seen by NP on 1/9 and given doxy, pred taper, Brovana neb. Patient reported lack of improvement and we advised more sustained prednisone elevation till better. Still not out of the woods. Admits she doesn't feel well. Reviewed hx over this winter- took Zpak at Christmas, then doxycycline. Admits she pushes herself. Admits not wheezing,  but feels tight. Discussed  Daliresp- too expensive, but she is interested in going back on her residual samples for more trial. She had failed to tolerate Singulair- systemic malaise.  Now on prednisone 10 mg for the last 4 days.   Asthma History    Asthma Control Assessment:    Age range: 12+ years    Symptoms: >2 days/week    Nighttime Awakenings: 0-2/month    Interferes w/ normal activity: no limitations    SABA use (not for EIB): >2 days/week  Asthma Control Assessment: Not Well Controlled   Preventive Screening-Counseling & Management  Alcohol-Tobacco     Smoking Status: quit > 6 months     Smoking Cessation Counseling: yes     Packs/Day: 2.0     Year Started: 1956     Year Quit: 1991     Pack years: 30 yrs  1 pack daily     Tobacco Counseling: not to resume use of tobacco products  Current Medications (verified): 1)  Hydrochlorothiazide 25 Mg  Tabs (Hydrochlorothiazide) .Marland Kitchen.. 1 Tab By Mouth Once Daily 2)  Lexapro 10 Mg  Tabs (Escitalopram Oxalate) .... 1/2 Tab By Mouth Once Daily 3)  Xopenex 1.25 Mg/14ml Nebu (Levalbuterol Hcl) .Marland Kitchen.. 1 Vial Via [employer] Two Times A Day 4)  Losartan Potassium 25 Mg Tabs (Losartan Potassium) .... Take 1 Tablet By Mouth Once A Day 5)  Oxygen .... Wear 2l/min As Needed With Activity and At Bedtime 6)  Proair Hfa 108 (90 Base) Mcg/act  Aers (Albuterol Sulfate) .... 2 Puffs 4 Times Daily If Needed 7)  Mucinex Dm Maximum Strength 60-1200 Mg  Tb12 (Dextromethorphan-Guaifenesin) .... Take 1 Tablet By Mouth Two Times A Day 8)  Prednisone 10 Mg Tabs (Prednisone) .... 3-4 By Mouth Daily 9)  Brovana 15 Mcg/72ml Nebu (Arformoterol Tartrate) .... Neb Two Times A Day  Allergies (verified): 1)  ! * Diltiazem 2)  ! * Singulair  Past History:  Past Medical History: Last updated: 04/04/2010 DERMATITIS (ICD-692.9) GERD (ICD-530.81) DEPRESSION (ICD-311) AORTIC STENOSIS (ICD-424.1) mild by echo  2008 PAROXYSMAL ATRIAL FIBRILLATION (ICD-427.31) - 11/08 ALLERGIC RHINITIS (ICD-477.9) RHEUMATOID ARTHRITIS (ICD-714.0)--chronic low dose prednisone at 5mg  once daily  HYPERTENSION (ICD-401.9) BRONCHITIS, OBSTRUCTIVE CHRONIC, ACUTE EXACERBATION (ICD-491.21) COLON CANCER, FAMILY HISTORY (ICD-V16.0) STASIS DERMATITIS (ICD-454.1) MULTIPLE SCLEROSIS (ICD-340) COPD (ICD-496) ASTHMA, CHRONIC OBSTRUCTIVE NOS (ICD-493.20) PFT  severe COPD--7/10>>FEV1 39%, ratio of 31  Hypertension Rheumatoid Arthritis  Acute urinary  infection not otherwise specified.   Past Surgical History: Last updated: 06/04/2009 Hysterectomy Breast cyst  L5 diskectomy in 2003.   Family History: Last updated: 2009/09/01 Mother- deceased age 20;rheumatism, asthma Father- deceased age 88; emphysema 69 Brothers and Sisters- 1 with colon cancer and 1 deceased with heart trouble.  Brother- died Alzheimers  Social History: Last updated: 02/29/2008 Patient states former smoker.  Quit in 1991. Cares for husband (demented) Married with 2 children.  Risk Factors: Smoking Status: quit > 6 months (10/20/2010) Packs/Day: 2.0 (10/20/2010)  Review of Systems      See HPI       The patient complains of shortness of breath with activity and non-productive cough.  The patient denies shortness of breath at rest, productive cough, coughing up blood, chest pain, irregular heartbeats, acid heartburn, indigestion, loss of appetite, weight change, abdominal pain, difficulty swallowing, sore throat, tooth/dental problems, headaches, nasal congestion/difficulty breathing through nose, sneezing, itching, anxiety, rash, change in color of mucus, and fever.    Vital Signs:  Patient profile:   77 year old female Height:      63 inches Weight:      140.25 pounds BMI:     24.93 O2 Sat:      92 % on Room air Pulse rate:   71 / minute BP sitting:   126 / 84  (left arm) Cuff size:   regular  Vitals Entered By: Reynaldo Minium CMA (October 20, 2010 2:06 PM)  O2 Flow:  Room air CC: 3 month follow up visit-wheezing and SOB.   Physical Exam  Additional Exam:  General: A/Ox3; pleasant in NAD, SKIN: clear w/ no rash.  NODES: no lymphadenopathy noted.  HEENT: [place]/AT, EOM- WNL, Conjuctivae- clear, PERRLA, TM, clear left, , Nose- clear drainage  Throat- clear  Mallampati II NECK: Supple w/ fair ROM, JVD- none, normal carotid impulses w/o bruits Thyroid-  CHEST Unlabored w/o cough or wheeze HEART: regular  ABDOMEN: soft AVW:UJWJ, nl pulses, no  edema . Thin calves, negative Homan's, no cyanosis or clubbing NEURO: Grossly intact to observation      Impression & Recommendations:  Problem # 1:  BRONCHITIS, OBSTRUCTIVE CHRONIC, ACUTE EXACERBATION (ICD-491.21)  I think she may have seen the worst of this episode, likely starting as a viral infection. We dscussed prednisone. There is no role now for more antibiotic. We had a long discussion of Daliresp and she will try it again. Consider trial of Zyflo or accolate.   Problem # 2:  PAROXYSMAL ATRIAL FIBRILLATION (ICD-427.31)  In sinus rhytm by exam today.  Other Orders: Est. Patient Level III (19147)  Patient Instructions: 1)  Please schedule a follow-up appointment in 3 months. 2)  Ok to restart Daliresp 3)  Ok to hold prednisone at 10 mg daily  now

## 2010-11-05 ENCOUNTER — Telehealth: Payer: Self-pay | Admitting: Internal Medicine

## 2010-11-06 ENCOUNTER — Telehealth: Payer: Self-pay | Admitting: Internal Medicine

## 2010-11-06 NOTE — Assessment & Plan Note (Signed)
Summary: OV TIGHTNESS IN CHEST//SH   Primary Provider/Referring Provider:  Duffy Rhody  CC:  Acute visit-tightness in chest; SOB and wheezing.Marland Kitchen  History of Present Illness: September 29, 2010 --Presents for an acute office visit.Complains of chest congestion, wheezing, DOE, cough with small amounts of mucus, sweats x1week, worse x3days. Has had on /off symptoms x 3 weeks. Worse  x 3 days. Denies chest pain, dyspnea, orthopnea, hemoptysis, fever, n/v/d, edema, headache,recent travel or antibiotics.    October 20, 2010- Chronic obstructive asthma, PAF, RA,  Nurse-CC: 3 month follow up visit-wheezing and SOB. Seen by NP on 1/9 and given doxy, pred taper, Brovana neb. Patient reported lack of improvement and we advised more sustained prednisone elevation till better. Still not out of the woods. Admits she doesn't feel well. Reviewed hx over this winter- took Zpak at Christmas, then doxycycline. Admits she pushes herself. Admits not wheezing,  but feels tight. Discussed  Daliresp- too expensive, but she is interested in going back on her residual samples for more trial. She had failed to tolerate Singulair- systemic malaise.  Now on prednisone 10 mg for the last 4 days.   October 27, 2010- Chronic obstructive asthma, PAF, RA,  Nurse-CC: Acute visit-tightness in chest; SOB and wheezing// She has been better and worse some  days, but no obvious new infection. .Getting tired and discouraged.  Has continued prednisone 10 mg daily since last here. She is trying Daliresp again, this time with less GI upset but no acute benefit.  Will need Liver functions after med changes    Asthma History    Asthma Control Assessment:    Age range: 12+ years    Symptoms: throughout the day    Nighttime Awakenings: 0-2/month    Interferes w/ normal activity: some limitations    SABA use (not for EIB): >2 days/week    Asthma Control Assessment: Very Poorly Controlled   Preventive Screening-Counseling &  Management  Alcohol-Tobacco     Smoking Status: quit > 6 months     Smoking Cessation Counseling: yes     Packs/Day: 2.0     Year Started: 1954     Year Quit: 1991     Pack years: 30 yrs  1 pack daily     Tobacco Counseling: not to resume use of tobacco products  Current Medications (verified): 1)  Hydrochlorothiazide 25 Mg  Tabs (Hydrochlorothiazide) .Marland Kitchen.. 1 Tab By Mouth Once Daily 2)  Lexapro 10 Mg  Tabs (Escitalopram Oxalate) .... 1/2 Tab By Mouth Once Daily 3)  Xopenex 1.25 Mg/48ml Nebu (Levalbuterol Hcl) .Marland Kitchen.. 1 Vial Via [employer] Two Times A Day 4)  Losartan Potassium 25 Mg Tabs (Losartan Potassium) .... Take 1 Tablet By Mouth Once A Day 5)  Oxygen .... Wear 2l/min As Needed With Activity and At Bedtime 6)  Proair Hfa 108 (90 Base) Mcg/act  Aers (Albuterol Sulfate) .... 2 Puffs 4 Times Daily If Needed 7)  Mucinex Dm Maximum Strength 60-1200 Mg  Tb12 (Dextromethorphan-Guaifenesin) .... Take 1 Tablet By Mouth Two Times A Day 8)  Prednisone 10 Mg Tabs (Prednisone) .... 3-4 By Mouth Daily 9)  Brovana 15 Mcg/45ml Nebu (Arformoterol Tartrate) .... Neb Two Times A Day  Allergies (verified): 1)  ! * Diltiazem 2)  ! * Singulair  Past History:  Past Medical History: Last updated: 04/04/2010 DERMATITIS (ICD-692.9) GERD (ICD-530.81) DEPRESSION (ICD-311) AORTIC STENOSIS (ICD-424.1) mild by echo 2008 PAROXYSMAL ATRIAL FIBRILLATION (ICD-427.31) - 11/08 ALLERGIC RHINITIS (ICD-477.9) RHEUMATOID ARTHRITIS (ICD-714.0)--chronic low dose prednisone at  5mg  once daily  HYPERTENSION (ICD-401.9) BRONCHITIS, OBSTRUCTIVE CHRONIC, ACUTE EXACERBATION (ICD-491.21) COLON CANCER, FAMILY HISTORY (ICD-V16.0) STASIS DERMATITIS (ICD-454.1) MULTIPLE SCLEROSIS (ICD-340) COPD (ICD-496) ASTHMA, CHRONIC OBSTRUCTIVE NOS (ICD-493.20) PFT  severe COPD--7/10>>FEV1 39%, ratio of 31  Hypertension Rheumatoid Arthritis  Acute urinary infection not otherwise specified.   Past Surgical History: Last updated:  06/04/2009 Hysterectomy Breast cyst  L5 diskectomy in 2003.   Family History: Last updated: 09-16-09 Mother- deceased age 70;rheumatism, asthma Father- deceased age 40; emphysema 14 Brothers and Sisters- 1 with colon cancer and 1 deceased with heart trouble.  Brother- died Alzheimers  Social History: Last updated: 02/29/2008 Patient states former smoker.  Quit in 1991. Cares for husband (demented) Married with 2 children.  Risk Factors: Smoking Status: quit > 6 months (10/27/2010) Packs/Day: 2.0 (10/27/2010)  Review of Systems      See HPI       The patient complains of shortness of breath with activity, productive cough, and non-productive cough.  The patient denies shortness of breath at rest, coughing up blood, chest pain, irregular heartbeats, acid heartburn, indigestion, loss of appetite, weight change, abdominal pain, difficulty swallowing, sore throat, tooth/dental problems, headaches, nasal congestion/difficulty breathing through nose, and sneezing.    Vital Signs:  Patient profile:   77 year old female Height:      63 inches Weight:      138 pounds BMI:     24.53 O2 Sat:      92 % on Room air Pulse rate:   101 / minute BP sitting:   116 / 72  (left arm) Cuff size:   regular  Vitals Entered By: Reynaldo Minium CMA (October 27, 2010 3:16 PM)  O2 Flow:  Room air CC: Acute visit-tightness in chest; SOB and wheezing.   Physical Exam  Additional Exam:  General: A/Ox3; pleasant in NAD, SKIN: clear w/ no rash.  NODES: no lymphadenopathy noted.  HEENT: [place]/AT, EOM- WNL, Conjuctivae- clear, PERRLA, TM, clear left, , Nose- clear drainage  Throat- clear  Mallampati II NECK: Supple w/ fair ROM, JVD- none, normal carotid impulses w/o bruits Thyroid-  CHEST Quietr and distant with little cough. HEART: regular  ABDOMEN: soft AVW:UJWJ, nl pulses, no edema . Thin calves, , no cyanosis or clubbing NEURO: Grossly intact to observation      Impression &  Recommendations:  Problem # 1:  BRONCHITIS, OBSTRUCTIVE CHRONIC, ACUTE EXACERBATION (ICD-491.21)  Severe COPD with sustained exacerbation. we are trying antinflamatory strategies. She keeps describing sense that mucus is stuck in chest.  I will first see if she can respond to a limited trial of acetylcsteine neb.   After that, we will try adding Zyflo (Singulair caused fever) and maintnenance macrolide as an antibiotic/ antiinflammatory  Problem # 2:  PAROXYSMAL ATRIAL FIBRILLATION (ICD-427.31) We are tryuing strategies that don't heavily add stimulants to avoid triggerring her arrhytmia.  Medications Added to Medication List This Visit: 1)  Acetylcysteine 20 % Soln (Acetylcysteine) .... 2 ml with your neb medicine three times a day x 3 days 2)  Zyflo Cr 600 Mg Xr12h-tab (Zileuton) .... 2 two times a day 3)  Azithromycin 250 Mg Tabs (Azithromycin) .... 2 on first day, then one daily  Other Orders: Est. Patient Level IV (19147)  Patient Instructions: 1)  Please schedule a follow-up appointment in 1 month. 2)  Continue prednisone 10 mg daily 3)  Try using acetylcysteine with your regular neb medicine, regularly as directed till used up 4)  After that is completed, then  try adding ZyfloCR, sample and script 5)    2, twice daily 6)  And also add daily maintnenace zithromax as directed- script Prescriptions: AZITHROMYCIN 250 MG TABS (AZITHROMYCIN) 2 on first day, then one daily  #30 x 1   Entered and Authorized by:   Waymon Budge MD   Signed by:   Waymon Budge MD on 10/27/2010   Method used:   Print then Give to Patient   RxID:   [id number] ZYFLO CR 600 MG XR12H-TAB (ZILEUTON) 2 two times a day  #60 x prn   Entered and Authorized by:   Waymon Budge MD   Signed by:   Waymon Budge MD on 10/27/2010   Method used:   Print then Give to Patient   RxID:   [id number] ACETYLCYSTEINE 20 % SOLN (ACETYLCYSTEINE) 2 ml with your neb medicine three times a day x 3 days  #20  ml or QS x 0   Entered and Authorized by:   Waymon Budge MD   Signed by:   Waymon Budge MD on 10/27/2010   Method used:   Print then Give to Patient   RxID:   [id number]

## 2010-11-06 NOTE — Progress Notes (Signed)
Summary: Change Zyflo to Accolate---stop Acetylcysteine  Phone Note Call from Patient Call back at Home Phone [phone]   Caller: Patient Call For: young Summary of Call: pt states that [facility] did not have ACETYLCYSTEINE. also says that they called "everywhere" and this was not avail. pls advise. ALSO: pt states that they didn't have ZYFLO on hand. if they got it, it will cost pt 287.00 for 14 day supply. pt wants to know if another rx could be subbed for this (a less expensive alternative). pt can best be reached at home today around 3pm.  Initial call taken by: Tivis Ringer, CNA,  October 28, 2010 11:15 AM  Follow-up for Phone Call        [id number]. Carron Curie CMA  October 28, 2010 1:13 PM  Pt says she cannot afford Zyflo and the pharmacy has had no luck finding  the ACETYLCYSTEINE. Pls advise if there are any alternatives that would be appropriate for this patient to try. ALLERGIES:  Diltiazem, Singulair Michel Bickers [facility]  October 28, 2010 2:32 PM  Additional Follow-up for Phone Call Additional follow up Details #1::        Per CDY-forget the Acetylcysteine rx and change Zyflo CR to Accolate 20mg  #60 take 1 by mouth two times a day before meals with as needed refills.Reynaldo Minium CMA  October 28, 2010 3:27 PM   Pt is aware of recs per CDY and new RX for Accolate sent to pharnacy.Michel Bickers CMA  October 28, 2010 3:44 PM    New/Updated Medications: ACCOLATE 20 MG TABS (ZAFIRLUKAST) 1 by mouth two times a day Prescriptions: ACCOLATE 20 MG TABS (ZAFIRLUKAST) 1 by mouth two times a day  #60 x 11   Entered by:   Michel Bickers CMA   Authorized by:   Waymon Budge MD   Signed by:   Michel Bickers CMA on 10/28/2010   Method used:   Electronically to        [facility] (retail)       [address]       [place], [place]  [address]       Ph: [phone] or [phone]       Fax: [phone]   RxID:   [id number]

## 2010-11-12 NOTE — Progress Notes (Signed)
Summary: pain weakness thinks it may be meds  Phone Note Call from Patient Call back at Home Phone [phone]   Caller: Patient Call For: Mohid Furuya Summary of Call: Patient phoned states that she aches all over almost like she has the flu. Stated that the last time she saw Dr Maple Hudson he started her on three different medicines. She wants to know if the Zafirlukast 20 mg twice daily could be causing her to feel this way the inforamtion sheet from the drug store states that it can cause generalized pain, weakness, headaches. He also gave her Azithromycin 250 mg. She can be reached at [phone]. Patient stated that she cant live like this and something needs to be done. Initial call taken by: Vedia Coffer,  November 05, 2010 8:59 AM  Follow-up for Phone Call        Pt c/o bodyaches all the time, decreased appetite, going from hot to cold, weakness, chest congestion, chest tightness, diarrhea worse times 2 days. Pt denies cough, n/v, has not been able to check temperature. Pt states she feels the same way from when she use to take Singulair. Please advise. Thanks.   Allergies (verified):  1)  ! * Diltiazem 2)  ! * Singulair  Follow-up by: Zackery Barefoot CMA,  November 05, 2010 9:28 AM  Additional Follow-up for Phone Call Additional follow up Details #1::        Per CDY-okay to stop Zafirlukast and let me know if no better in 2-3 days.Reynaldo Minium CMA  November 05, 2010 10:31 AM   Pt advsied. Carron Curie CMA  November 05, 2010 10:36 AM

## 2010-11-12 NOTE — Progress Notes (Signed)
Summary: doesnt think  she can wait another day having problems breathing  Phone Note Call from Patient Call back at Home Phone [phone]   Caller: Patient Call For: [place] Summary of Call: Patient called back today stated that she doesnt think she can wait two or three days. she is having difficulty breathing and wheezing states that she doesnt feel like she can get any air and cant sleep at night because of. Her mouth is sore and her top lip is swollen states that her husband told her last night that her face was swollen. she can be reached at [phone] Initial call taken by: Vedia Coffer,  November 06, 2010 2:15 PM  Follow-up for Phone Call        pt called yesterday and was told to call back in 2-3 days pt states she could not wait 2-3days that she is very sob and wheezing--pls advise Follow-up by: Philipp Deputy CMA,  November 06, 2010 4:32 PM  Additional Follow-up for Phone Call Additional follow up Details #1::        Per CDY-IF she thinks it thrush then we can give her Diflucan 150mg  #7 take 1 by mouth daily NO REFILLS. Also, she can use her Prednisone Rx and take 30mg  to 40mg  daily until better unless she has other ideas; if worse she may need to go to ER to be assessed.Reynaldo Minium CMA  November 06, 2010 5:10 PM     Additional Follow-up for Phone Call Additional follow up Details #2::    called and spoke with pt and she is ok with trying the diflucan and this has been [facility] per pts request---pt will call back for any further problems Randell Loop CMA  November 06, 2010 5:23 PM   New/Updated Medications: FLUCONAZOLE 150 MG TABS (FLUCONAZOLE) take one tablet by mouth once daily Prescriptions: FLUCONAZOLE 150 MG TABS (FLUCONAZOLE) take one tablet by mouth once daily  #7 x 0   Entered by:   Randell Loop CMA   Authorized by:   Waymon Budge MD   Signed by:   Randell Loop CMA on 11/06/2010   Method used:   Electronically to        [facility] (retail)       [address]       [place], [place]  [address]       Ph: [phone] or [phone]       Fax: [phone]   RxID:   [id number]

## 2010-11-26 ENCOUNTER — Encounter: Payer: Self-pay | Admitting: Cardiovascular Disease

## 2010-11-27 ENCOUNTER — Encounter: Payer: Self-pay | Admitting: Internal Medicine

## 2010-11-27 ENCOUNTER — Ambulatory Visit (INDEPENDENT_AMBULATORY_CARE_PROVIDER_SITE_OTHER): Payer: Medicare Other | Admitting: Internal Medicine

## 2010-11-27 DIAGNOSIS — J449 Chronic obstructive pulmonary disease, unspecified: Secondary | ICD-10-CM

## 2010-11-27 DIAGNOSIS — I4891 Unspecified atrial fibrillation: Secondary | ICD-10-CM

## 2010-12-06 LAB — GLUCOSE, CAPILLARY
Glucose-Capillary: 100 mg/dL — ABNORMAL HIGH (ref 70–99)
Glucose-Capillary: 121 mg/dL — ABNORMAL HIGH (ref 70–99)
Glucose-Capillary: 123 mg/dL — ABNORMAL HIGH (ref 70–99)
Glucose-Capillary: 128 mg/dL — ABNORMAL HIGH (ref 70–99)
Glucose-Capillary: 139 mg/dL — ABNORMAL HIGH (ref 70–99)
Glucose-Capillary: 143 mg/dL — ABNORMAL HIGH (ref 70–99)
Glucose-Capillary: 148 mg/dL — ABNORMAL HIGH (ref 70–99)
Glucose-Capillary: 164 mg/dL — ABNORMAL HIGH (ref 70–99)
Glucose-Capillary: 168 mg/dL — ABNORMAL HIGH (ref 70–99)
Glucose-Capillary: 184 mg/dL — ABNORMAL HIGH (ref 70–99)
Glucose-Capillary: 200 mg/dL — ABNORMAL HIGH (ref 70–99)
Glucose-Capillary: 217 mg/dL — ABNORMAL HIGH (ref 70–99)
Glucose-Capillary: 223 mg/dL — ABNORMAL HIGH (ref 70–99)
Glucose-Capillary: 93 mg/dL (ref 70–99)

## 2010-12-06 LAB — DIFFERENTIAL
Basophils Absolute: 0 10*3/uL (ref 0.0–0.1)
Basophils Relative: 0 % (ref 0–1)
Eosinophils Absolute: 0 10*3/uL (ref 0.0–0.7)
Eosinophils Relative: 0 % (ref 0–5)
Lymphocytes Relative: 3 % — ABNORMAL LOW (ref 12–46)
Lymphs Abs: 0.6 10*3/uL — ABNORMAL LOW (ref 0.7–4.0)
Monocytes Relative: 3 % (ref 3–12)
Neutro Abs: 16 10*3/uL — ABNORMAL HIGH (ref 1.7–7.7)
Neutrophils Relative %: 94 % — ABNORMAL HIGH (ref 43–77)
Neutrophils Relative %: 95 % — ABNORMAL HIGH (ref 43–77)

## 2010-12-06 LAB — CBC
HCT: 43.1 % (ref 36.0–46.0)
Hemoglobin: 15 g/dL (ref 12.0–15.0)
MCH: 31.4 pg (ref 26.0–34.0)
MCHC: 34.2 g/dL (ref 30.0–36.0)
MCV: 90.4 fL (ref 78.0–100.0)
MCV: 91.6 fL (ref 78.0–100.0)
MCV: 91.9 fL (ref 78.0–100.0)
Platelets: 242 10*3/uL (ref 150–400)
Platelets: 246 10*3/uL (ref 150–400)
Platelets: 257 10*3/uL (ref 150–400)
RBC: 4.66 MIL/uL (ref 3.87–5.11)
RBC: 4.77 MIL/uL (ref 3.87–5.11)
RDW: 13.7 % (ref 11.5–15.5)
WBC: 17.1 10*3/uL — ABNORMAL HIGH (ref 4.0–10.5)

## 2010-12-06 LAB — BASIC METABOLIC PANEL
Calcium: 9 mg/dL (ref 8.4–10.5)
Chloride: 98 mEq/L (ref 96–112)
Creatinine, Ser: 0.75 mg/dL (ref 0.4–1.2)
GFR calc Af Amer: >60 mL/min (ref >60)
GFR calc non Af Amer: >60 mL/min (ref >60)

## 2010-12-06 LAB — COMPREHENSIVE METABOLIC PANEL
CO2: 29 mEq/L (ref 19–32)
Chloride: 96 mEq/L (ref 96–112)
GFR calc Af Amer: >60 mL/min (ref >60)
GFR calc non Af Amer: >60 mL/min (ref >60)
Potassium: 3.9 mEq/L (ref 3.5–5.1)
Total Bilirubin: 1.1 mg/dL (ref 0.3–1.2)

## 2010-12-06 LAB — CULTURE, RESPIRATORY W GRAM STAIN

## 2010-12-06 LAB — HEMOCCULT GUIAC POC 1CARD (OFFICE): Fecal Occult Bld: NEGATIVE

## 2010-12-06 LAB — T4: T4, Total: 6.1 ug/dL (ref 5.0–12.5)

## 2010-12-06 LAB — TSH
TSH: 0.074 u[IU]/mL — ABNORMAL LOW (ref 0.350–4.500)
TSH: 0.097 u[IU]/mL — ABNORMAL LOW (ref 0.350–4.500)

## 2010-12-06 LAB — EXPECTORATED SPUTUM ASSESSMENT W GRAM STAIN, RFLX TO RESP C

## 2010-12-07 LAB — BASIC METABOLIC PANEL
CO2: 34 mEq/L — ABNORMAL HIGH (ref 19–32)
Calcium: 9.3 mg/dL (ref 8.4–10.5)
GFR calc Af Amer: >60 mL/min (ref >60)
GFR calc non Af Amer: >60 mL/min (ref >60)
Potassium: 5.4 mEq/L — ABNORMAL HIGH (ref 3.5–5.1)
Sodium: 136 mEq/L (ref 135–145)

## 2010-12-09 NOTE — Assessment & Plan Note (Signed)
Summary: rov 1 month ///kp   Primary Provider/Referring Provider:  Duffy Rhody  CC:  1 month follow up visit-still wheezing.Marland Kitchen  History of Present Illness: October 20, 2010- Chronic obstructive asthma, PAF, RA,  Nurse-CC: 3 month follow up visit-wheezing and SOB. Seen by NP on 1/9 and given doxy, pred taper, Brovana neb. Patient reported lack of improvement and we advised more sustained prednisone elevation till better. Still not out of the woods. Admits she doesn't feel well. Reviewed hx over this winter- took Zpak at Christmas, then doxycycline. Admits she pushes herself. Admits not wheezing,  but feels tight. Discussed  Daliresp- too expensive, but she is interested in going back on her residual samples for more trial. She had failed to tolerate Singulair- systemic malaise.  Now on prednisone 10 mg for the last 4 days.   October 27, 2010- Chronic obstructive asthma, PAF, RA,  Nurse-CC: Acute visit-tightness in chest; SOB and wheezing// She has been better and worse some  days, but no obvious new infection. .Getting tired and discouraged.  Has continued prednisone 10 mg daily since last here. She is trying Daliresp again, this time with less GI upset but no acute benefit.  Will need Liver functions after med changes  November 27, 2010-  Chronic obstructive asthma, PAF, RA,  Nurse-CC: 1 month follow up visit-still wheezing. Lenice Llamas is no help and caused malaise. Accolate may have caused angioedema. couldn't afford Zyflo.  Acetylcysteine was unavailable national back order.  Now taking second course of doxy. Now vague malaise. Today some tight and wheezey. Taking prednisone 10 mg daily, now for past week after last taper. Will go up to 30-40 on bad day if needed.      Asthma History    Asthma Control Assessment:    Age range: 12+ years    Symptoms: throughout the day    Nighttime Awakenings: 0-2/month    Interferes w/ normal activity: some limitations    SABA use (not for EIB):  several times per day    Asthma Control Assessment: Very Poorly Controlled   Preventive Screening-Counseling & Management  Alcohol-Tobacco     Smoking Status: quit > 6 months     Smoking Cessation Counseling: yes     Packs/Day: 2.0     Year Started: 1956     Year Quit: 1991     Pack years: 30 yrs  1 pack daily     Tobacco Counseling: not to resume use of tobacco products  Current Medications (verified): 1)  Hydrochlorothiazide 25 Mg  Tabs (Hydrochlorothiazide) .Marland Kitchen.. 1 Tab By Mouth Once Daily 2)  Lexapro 10 Mg  Tabs (Escitalopram Oxalate) .... 1/2 Tab By Mouth Once Daily 3)  Xopenex 1.25 Mg/51ml Nebu (Levalbuterol Hcl) .Marland Kitchen.. 1 Vial [facility] [employer] Two Times A Day 4)  Losartan Potassium 25 Mg Tabs (Losartan Potassium) .... Take 1 Tablet By Mouth Once A Day 5)  Oxygen .... Wear 2l/min As Needed With Activity and At Bedtime 6)  Proair Hfa 108 (90 Base) Mcg/act  Aers (Albuterol Sulfate) .... 2 Puffs 4 Times Daily If Needed 7)  Mucinex Dm Maximum Strength 60-1200 Mg  Tb12 (Dextromethorphan-Guaifenesin) .... Take 1 Tablet By Mouth Two Times A Day 8)  Prednisone 10 Mg Tabs (Prednisone) .... 3-4 By Mouth Daily 9)  Brovana 15 Mcg/15ml Nebu (Arformoterol Tartrate) .... Neb Two Times A Day  Allergies: 1)  ! * Diltiazem 2)  ! * Singulair 3)  ! * Accolate 4)  ! * Dalresp  Past  History:  Past Medical History: Last updated: 04/04/2010 DERMATITIS (ICD-692.9) GERD (ICD-530.81) DEPRESSION (ICD-311) AORTIC STENOSIS (ICD-424.1) mild by echo 2008 PAROXYSMAL ATRIAL FIBRILLATION (ICD-427.31) - 11/08 ALLERGIC RHINITIS (ICD-477.9) RHEUMATOID ARTHRITIS (ICD-714.0)--chronic low dose prednisone at 5mg  once daily  HYPERTENSION (ICD-401.9) BRONCHITIS, OBSTRUCTIVE CHRONIC, ACUTE EXACERBATION (ICD-491.21) COLON CANCER, FAMILY HISTORY (ICD-V16.0) STASIS DERMATITIS (ICD-454.1) MULTIPLE SCLEROSIS (ICD-340) COPD (ICD-496) ASTHMA, CHRONIC OBSTRUCTIVE NOS (ICD-493.20) PFT  severe COPD--7/10>>FEV1 39%, ratio of 31    Hypertension Rheumatoid Arthritis  Acute urinary infection not otherwise specified.   Past Surgical History: Last updated: 06/04/2009 Hysterectomy Breast cyst  L5 diskectomy in 2003.   Family History: Last updated: 09-02-09 Mother- deceased age 70;rheumatism, asthma Father- deceased age 41; emphysema 67 Brothers and Sisters- 1 with colon cancer and 1 deceased with heart trouble.  Brother- died Alzheimers  Social History: Last updated: 02/29/2008 Patient states former smoker.  Quit in 1991. Cares for husband (demented) Married with 2 children.  Risk Factors: Smoking Status: quit > 6 months (11/27/2010) Packs/Day: 2.0 (11/27/2010)  Review of Systems      See HPI       The patient complains of shortness of breath with activity, shortness of breath at rest, and non-productive cough.  The patient denies coughing up blood, chest pain, irregular heartbeats, acid heartburn, indigestion, loss of appetite, weight change, abdominal pain, difficulty swallowing, sore throat, tooth/dental problems, headaches, nasal congestion/difficulty breathing through nose, sneezing, itching, ear ache, hand/feet swelling, rash, change in color of mucus, and fever.    Vital Signs:  Patient profile:   77 year old female Height:      63 inches Weight:      141.38 pounds BMI:     25.13 O2 Sat:      90 % on Room air Pulse rate:   90 / minute BP sitting:   126 / 72  (left arm) Cuff size:   regular  Vitals Entered By: Reynaldo Minium CMA (November 27, 2010 11:04 AM)  O2 Flow:  Room air CC: 1 month follow up visit-still wheezing.   Physical Exam  Additional Exam:  General: A/Ox3; pleasant in NAD, SKIN: clear w/ no rash.  NODES: no lymphadenopathy noted.  HEENT: [place]/AT, EOM- WNL, Conjuctivae- clear, PERRLA, TM, clear left, , Nose- clear drainage  Throat- clear  Mallampati II NECK: Supple w/ fair ROM, JVD- none, normal carotid impulses w/o bruits Thyroid-  CHEST Raspy cough but otherwise clear- no  rhonchi or wheeze. Poor airflow is baseline. HEART: regular  rhytrhm, no murmur.  ABDOMEN: soft HKV:QQVZ, nl pulses, no edema . Thin calves, , no cyanosis or clubbing NEURO: Grossly intact to observation      Impression & Recommendations:  Problem # 1:  BRONCHITIS, OBSTRUCTIVE CHRONIC, ACUTE EXACERBATION (ICD-491.21) Currently breathing is pretty good. Rhythm is regular. She hasn't tolerated the several newer or alternative meds  we tried, so we will give her time to settle down. She will adjust prednisone as needed, but remains steroid ependent and unstable. The baseline fixed obstructive component is significant but reversible obstruction comes on quickly and is poorly responsive, usually with viral infections.  Problem # 2:  PAROXYSMAL ATRIAL FIBRILLATION (ICD-427.31) She is aware that aggressive bronchodilator therapy cantip her into dysrhythmia.  Other Orders: Est. Patient Level III (56387)  Patient Instructions: 1)  Keep your scheduled appointment and we will see how you do. Call sooner as needed.

## 2010-12-10 ENCOUNTER — Ambulatory Visit: Payer: Self-pay | Admitting: Cardiovascular Disease

## 2010-12-10 LAB — POCT CARDIAC MARKERS
CKMB, poc: <1 ng/mL — ABNORMAL LOW (ref 1.0–8.0)
CKMB, poc: <1 ng/mL — ABNORMAL LOW (ref 1.0–8.0)
Myoglobin, poc: 54.1 ng/mL (ref 12–200)
Troponin i, poc: <0.05 ng/mL (ref 0.00–0.09)

## 2010-12-10 LAB — BASIC METABOLIC PANEL
Chloride: 99 mEq/L (ref 96–112)
GFR calc non Af Amer: >60 mL/min (ref >60)
Glucose, Bld: 190 mg/dL — ABNORMAL HIGH (ref 70–99)
Potassium: 3.5 mEq/L (ref 3.5–5.1)
Sodium: 139 mEq/L (ref 135–145)

## 2010-12-10 LAB — DIFFERENTIAL
Eosinophils Absolute: 0.1 10*3/uL (ref 0.0–0.7)
Eosinophils Relative: 0 % (ref 0–5)
Lymphocytes Relative: 6 % — ABNORMAL LOW (ref 12–46)
Lymphs Abs: 1 10*3/uL (ref 0.7–4.0)
Monocytes Absolute: 0.7 10*3/uL (ref 0.1–1.0)

## 2010-12-10 LAB — CBC
HCT: 40.4 % (ref 36.0–46.0)
Hemoglobin: 14 g/dL (ref 12.0–15.0)
MCV: 91.5 fL (ref 78.0–100.0)
WBC: 17.2 10*3/uL — ABNORMAL HIGH (ref 4.0–10.5)

## 2010-12-10 LAB — GLUCOSE, CAPILLARY
Glucose-Capillary: 171 mg/dL — ABNORMAL HIGH (ref 70–99)
Glucose-Capillary: 72 mg/dL (ref 70–99)

## 2010-12-10 LAB — CARDIAC PANEL(CRET KIN+CKTOT+MB+TROPI)
Relative Index: INVALID (ref 0.0–2.5)
Troponin I: 0.03 ng/mL (ref 0.00–0.06)

## 2010-12-22 LAB — CBC
MCHC: 33.5 g/dL (ref 30.0–36.0)
RBC: 4.67 MIL/uL (ref 3.87–5.11)

## 2010-12-22 LAB — DIFFERENTIAL
Basophils Absolute: 0 10*3/uL (ref 0.0–0.1)
Basophils Relative: 0 % (ref 0–1)
Monocytes Relative: 4 % (ref 3–12)
Neutro Abs: 13.3 10*3/uL — ABNORMAL HIGH (ref 1.7–7.7)
Neutrophils Relative %: 94 % — ABNORMAL HIGH (ref 43–77)

## 2010-12-22 LAB — BASIC METABOLIC PANEL
CO2: 30 mEq/L (ref 19–32)
Calcium: 9 mg/dL (ref 8.4–10.5)
Creatinine, Ser: 0.67 mg/dL (ref 0.4–1.2)
GFR calc Af Amer: >60 mL/min (ref >60)

## 2010-12-22 LAB — CULTURE, RESPIRATORY W GRAM STAIN

## 2010-12-22 LAB — EXPECTORATED SPUTUM ASSESSMENT W GRAM STAIN, RFLX TO RESP C

## 2010-12-30 ENCOUNTER — Telehealth: Payer: Self-pay | Admitting: Internal Medicine

## 2010-12-30 MED ORDER — PREDNISONE 10 MG PO TABS: ORAL_TABLET | ORAL | Status: DC

## 2010-12-30 NOTE — Telephone Encounter (Signed)
Spoke with pt to verify msg. Rx for prednisone was sent to [facility]. Advised pt to keep upcoming appt for future refills.

## 2011-01-06 ENCOUNTER — Encounter: Payer: Self-pay | Admitting: Cardiovascular Disease

## 2011-01-06 ENCOUNTER — Ambulatory Visit (INDEPENDENT_AMBULATORY_CARE_PROVIDER_SITE_OTHER): Payer: Medicare Other | Admitting: Cardiovascular Disease

## 2011-01-06 DIAGNOSIS — I10 Essential (primary) hypertension: Secondary | ICD-10-CM

## 2011-01-06 DIAGNOSIS — I359 Nonrheumatic aortic valve disorder, unspecified: Secondary | ICD-10-CM

## 2011-01-06 DIAGNOSIS — I480 Paroxysmal atrial fibrillation: Secondary | ICD-10-CM | POA: Insufficient documentation

## 2011-01-06 DIAGNOSIS — R0602 Shortness of breath: Secondary | ICD-10-CM

## 2011-01-06 DIAGNOSIS — I4891 Unspecified atrial fibrillation: Secondary | ICD-10-CM

## 2011-01-06 NOTE — Progress Notes (Signed)
77 yo with severe asthmatic chronic lung disease on oxygen at home.  Chronic dyspnea.  Previous echo 2010 with mild to moderate AS mean gradient 19.  Not a candidate for open surgery.  No palpiations, SSCP, cough sputum or syncope.  Hospitalized twice last year for pneumonia.  No history of CAD.  She has had PAF in 2008 and is at risk for MAT.  Doing quite well this Spring and surprisingly the pollen is not bothering her as much.  HTN under good control    ROS: Denies fever, malais, weight loss, blurry vision, decreased visual acuity, cough, sputum, SOB, hemoptysis, pleuritic pain, palpitaitons, heartburn, abdominal pain, melena, lower extremity edema, claudication, or rash.   General: Affect appropriate Healthy:  appears stated age HEENT: normal Neck supple with no adenopathy JVP normal no bruits no thyromegaly Lungs clear with no wheezing and good diaphragmatic motion Heart:  S1/S2 no murmur,rub, gallop or click PMI normal Abdomen: benighn, BS positve, no tenderness, no AAA no bruit.  No HSM or HJR Distal pulses intact with no bruits No edema Neuro non-focal Skin warm and dry No muscular weakness   Current Outpatient Prescriptions  Medication Sig Dispense Refill  . albuterol (PROAIR HFA) 108 (90 BASE) MCG/ACT inhaler Inhale 2 puffs into the lungs every 6 (six) hours as needed.        . ALPRAZolam (XANAX) 0.25 MG tablet Take by mouth at bedtime as needed.        Marland Kitchen arformoterol (BROVANA) 15 MCG/2ML NEBU Take 15 mcg by nebulization 2 (two) times daily.        Marland Kitchen Dextromethorphan-Guaifenesin 60-1200 MG per 12 hr tablet Take 1 tablet by mouth every 12 (twelve) hours.        . hydrochlorothiazide 25 MG tablet Take 25 mg by mouth daily.        Marland Kitchen losartan (COZAAR) 25 MG tablet Take 25 mg by mouth daily.        . predniSONE (DELTASONE) 10 MG tablet 1 tab po qd       . roflumilast (DALIRESP) 500 MCG TABS tablet Take 500 mcg by mouth daily.        Marland Kitchen DISCONTD: predniSONE (DELTASONE) 10 MG  tablet 3-4 tabs by mouth daily as directed  100 tablet  0  . DISCONTD: escitalopram (LEXAPRO) 10 MG tablet Take 5 mg by mouth daily.        Marland Kitchen DISCONTD: fluconazole (DIFLUCAN) 150 MG tablet Take 150 mg by mouth daily.        Marland Kitchen DISCONTD: levalbuterol (XOPENEX) 1.25 MG/3ML nebulizer solution Take 1 ampule by nebulization 2 (two) times daily.        Marland Kitchen DISCONTD: zafirlukast (ACCOLATE) 20 MG tablet Take 20 mg by mouth 2 (two) times daily.          Allergies  Montelukast sodium and Zafirlukast  Electrocardiogram:  Assessment and Plan

## 2011-01-06 NOTE — Assessment & Plan Note (Signed)
F/U echo. Does not sound servere on exam

## 2011-01-06 NOTE — Assessment & Plan Note (Signed)
Not likely cardiac. Chronic asthmatic COPD.  F/U Dr Fannie Knee.  Consider checking BNP"s with exacerbations  Echo for RV/LV function

## 2011-01-06 NOTE — Patient Instructions (Signed)
Your physician recommends that you schedule a follow-up appointment in: ONE YEAR 

## 2011-01-06 NOTE — Assessment & Plan Note (Signed)
Well controlled.  Continue current medications and low sodium Dash type diet.    

## 2011-01-06 NOTE — Assessment & Plan Note (Signed)
Nonrecurrent.  At risk for MAT  No indication for coumadin

## 2011-01-13 ENCOUNTER — Telehealth: Payer: Self-pay | Admitting: Internal Medicine

## 2011-01-13 MED ORDER — ALPRAZOLAM 0.25 MG PO TABS: 0.2500 mg | ORAL_TABLET | Freq: Every evening | ORAL | Status: DC | PRN

## 2011-01-13 NOTE — Telephone Encounter (Signed)
?  ok to refill pts alprazolam 0.25mg  1 tab at bedtime as needed for sleep.  pts last appt was 11-2010.  Please advise. thanks

## 2011-01-13 NOTE — Telephone Encounter (Signed)
Per CY---ok to refill the alprazolam 0.25mg   #30  With 5 refills   Take one tablet at bedtime prn.  Called and spoke with pt and she is aware of refills sent to the pharmacy

## 2011-01-16 ENCOUNTER — Encounter: Payer: Self-pay | Admitting: Internal Medicine

## 2011-01-19 ENCOUNTER — Encounter: Payer: Self-pay | Admitting: Internal Medicine

## 2011-01-19 ENCOUNTER — Ambulatory Visit (INDEPENDENT_AMBULATORY_CARE_PROVIDER_SITE_OTHER): Payer: Medicare Other | Admitting: Internal Medicine

## 2011-01-19 VITALS — BP 110/72 | HR 92 | Ht 63.0 in | Wt 142.2 lb

## 2011-01-19 DIAGNOSIS — J449 Chronic obstructive pulmonary disease, unspecified: Secondary | ICD-10-CM

## 2011-01-19 NOTE — Assessment & Plan Note (Addendum)
Today is an unusually good day- unclear how long it will last. We discussed her oxygen. She desaturated significantly coming in today and clearly still needs O2.  She has been on prednisone 10 mg daily and will try cutting it in half.

## 2011-01-19 NOTE — Progress Notes (Signed)
  Subjective:    Patient ID: [name], female    DOB: 1932-12-06, 77 y.o.   MRN: [id number]  HPI 59 yoF former smoker with COPD/ chronic obstructive asthma, marked labile component with recurrent acute bronchitis. Paroxysmal atrial fib and hx Aortic Stenosis. After bad 2-3 months, she cleared completely with 30 days of azithromycin given February 6. Dr Eden Emms has seen her and felt her murmur is stable. In spite of pollen season she feels very well. Sleeps with O2 every night and uses it as needed in daytime.   Review of Systems See HPI Constitutional:   No weight loss, night sweats,  Fevers, chills, fatigue, lassitude. HEENT:   No headaches,  Difficulty swallowing,  Tooth/dental problems,  Sore throat,                No sneezing, itching, ear ache, nasal congestion, post nasal drip,   CV:  No chest pain,  Orthopnea, PND, swelling in lower extremities, anasarca, dizziness, palpitations  GI  No heartburn, indigestion, abdominal pain, nausea, vomiting, diarrhea, change in bowel habits, loss of appetite    Resp:-No excess mucus, no productive cough,  No non-productive cough,  No coughing up of blood.  No change in color of mucus.  No wheezing.  Skin: no rash or lesions.  GU: no dysuria, change in color of urine, no urgency or frequency.  No flank pain.  MS:  No joint pain or swelling.  No decreased range of motion.  No back pain.  Psych:  No change in mood or affect. No depression or anxiety.  No memory loss.      Objective:   Physical Exam General- Alert, Oriented, Affect-appropriate, Distress- none acute  Skin- rash-none, lesions- none, excoriation- none  Lymphadenopathy- none  Head- atraumatic  Eyes- Gross vision intact, PERRLA, conjunctivae clear secretions  Ears- Normal-Hearing, canals,  Nose- Clear, No-Septal dev, mucus, polyps, erosion, perforation   Throat- Mallampati II , mucosa clear , drainage- none, tonsils- atrophic  Neck- flexible , trachea midline, no  stridor , thyroid nl, carotid no bruit  Chest - symmetrical excursion , unlabored     Heart/CV- RRR , no murmur heard through clothing , no gallop  , no rub, nl s1 s2                     - JVD- none , edema- none, stasis changes- none, varices- none     Lung- Diminished, but clearer than she has been all through the winter        wheeze- none, cough- none , dullness-none, rub- none     Chest wall-  Abd- tender-no, distended-no, bowel sounds-present, HSM- no  Br/ Gen/ Rectal- Not done, not indicated  Extrem- cyanosis- none, clubbing, none, atrophy- none, strength- nl  Neuro- grossly intact to observation         Assessment & Plan:

## 2011-01-19 NOTE — Patient Instructions (Addendum)
Ok to try off Oxygen when you are upright, awake and sitting quietly, but will need it with any exertion.

## 2011-01-19 NOTE — Progress Notes (Signed)
SATURATION QUALIFICATIONS:    Patient Saturations on Room Air while Ambulating = 78%  Patient Saturations on 2 Liters of oxygen while Ambulating = 93%[name],CMA

## 2011-01-23 ENCOUNTER — Encounter: Payer: Self-pay | Admitting: Internal Medicine

## 2011-01-23 NOTE — Assessment & Plan Note (Signed)
Near her baseline. The usual trigger has been viral infections. We continue to work with available meds as reviewed wtih her today.

## 2011-02-03 NOTE — Assessment & Plan Note (Signed)
[facility]                            CARDIOLOGY OFFICE NOTE   NAME:[name]                        MRN:          [id number]  DATE:08/15/2007                            DOB:          [birth year]    [name] is seen today as a new patient by me.  She was seen by Dr.  Dietrich Pates in the hospital.  She was discharged, I believe, on November  10.  She was admitted with paroxysmal atrial fibrillation.  I take care  of the patient's husband, and she wanted to follow up with me.   The patient is a previous smoker with a history of asthma and COPD.  She  sees Dr. Purnell Shoemaker and Dr. Maple Hudson for this.  She actually had a prednisone  taper while in the hospital.  Her PAF converted rapidly on IV Cardizem.  She was not sent home on Coumadin.  She has not had any recurrences.  She had a 2D echocardiogram in the hospital which showed essentially  aortic valve sclerosis.  Coronary risk factors include hypertension.   In talking to the patient, she has not had any recurrences.  There has  been no chest pain.  She has chronic dyspnea from her COPD and asthma.  She has had a flu shot and Pneumovax.   I explained to her in detail the natural history of PAF, the fact that  she should be on a baby aspirin for now.  She has good LV function and  is hypertensive, but otherwise does not have many risk factors in  regards to CVA.  She prefers not to be on Coumadin at this time.  Her  dyspnea is chronic.  It is stable.  There is no significant cough or  wheezing at this time.  There is no sputum production.   REVIEW OF SYSTEMS:  Remarkable for some eczematous type outbreak on the  right lower extremity.  She has seen a dermatologist for this before.  Otherwise, negative.   CURRENT MEDICATIONS:  1. An aspirin a day.  2. Cardizem 180 a day.  3. Her inhalers.   She has finished her prednisone taper.   PHYSICAL EXAMINATION:  Her exam is remarkable for a blood pressure of  140/80.  She is in sinus rhythm at a rate of 68.  Afebrile.  Respiratory  rate 14.  HEENT:  Unremarkable.  Carotids normal without bruit, no lymphadenopathy, thyromegaly or JVP  elevation.  LUNGS:  Clear with good diaphragmatic motion, no active wheezing.  S1, S2 with an aortic sclerosis murmur.  PMI normal.  ABDOMEN:  Benign.  Bowel sounds positive.  No AAA, no hepatosplenomegaly  or hepatojugular reflux.  Distal pulses are intact, no edema.  SKIN:  Warm and dry.  She does have some mild erythema, but no plaquing  in the right lower extremity.  NEURO:  Nonfocal, there is no muscular weakness.   EKG shows sinus rhythm with a poor R-wave progression.  No acute  changes.   IMPRESSION:  1. An isolated episode of paroxysmal atrial fibrillation, continue  Cardizem 180 a day, no indication for Coumadin at this time.  She      will call us if she has any recurrent palpitations, otherwise I      will see her in 3 months.  2. Dyspnea in the setting of chronic obstructive pulmonary disease and      asthma.  Continue current medications.  Just off prednisone taper.      Follow up with Dr. Maple Hudson.  3. Hypertension, currently well controlled.  Medications switched      while in the hospital to add Cardizem for rate control of atrial      fibrillation and hypertension seems to be doing a good job.      Continue low-salt diet.  4. Needs dermatological followup.  I suspect she has some sort of      eczema in the right lower extremity.  It may be breaking out now      since she is done with her prednisone taper.  We will leave it up      to her medical doctors as to whether she needs to have renewed      systemic steroids versus some sort of cream for the lower      extremity.  It does not look like cellulitis, and is fairly mild at      this time.  I will see her back in 3 months.  At some point she      will need to be risk stratified in regards to coronary disease      since this was not  done in the hospital.  She will be somewhat      difficult to stress.  She cannot have adenosine due to her asthma      and chronic obstructive pulmonary disease, and I doubt that she can      walk on a treadmill.  I am a little leery to give her dobutamine at      this point in time since she has had paroxysmal atrial      fibrillation.     Noralyn Pick. Eden Emms, MD, [facility]  Electronically Signed    PCN/MedQ  DD: 08/15/2007  DT: 08/15/2007  Job #: [id number]

## 2011-02-03 NOTE — Assessment & Plan Note (Signed)
[facility]                             PULMONARY OFFICE NOTE   NAME:Armstrong, Rachael KNOWLES                        MRN:          [id number]  DATE:03/31/2007                            DOB:          [birth year]    PROBLEM LIST:  1. Chronic asthma.  2. Multiple sclerosis.   HISTORY:  Coughing more recently, and when she coughs she notices a  tussive pain at the right temple.  No visual change.  She was treated  for stasis dermatitis with a steroid topical ointment and elastic hose,  but says it did not seem to improve until we called in a prednisone  taper June 23.  She has finished that.  Right leg is worse than left.  She has no history of VTE.  Pulmonary function tests March 6 had  demonstrated severe obstructive airways disease with response to  bronchodilators.  FEV1 was 1.18 (65% predicted) with an FEV1 FVC ratio  of 0.39, normal lung volumes, and moderately reduced diffusion capacity  at 62%.  She had been a smoker.  CT angiogram of the chest in March of  2007 had been negative for pulmonary embolism, had shown COPD with some  nonspecific adenopathy and with atherosclerotic changes.   OBJECTIVE:  Weight 165 pounds, BP 132/65, pulse 98, room air saturation  90%.  She is tender in the area of the right temporal artery, although I do  not see that the artery, itself, is nodular or especially prominent.  I  do not find adenopathy.  Breath sounds are quiet.  Heart sounds regular without murmur.  There is at least 1+ edema, both ankles, with some stasis changes  especially on the right.  I cannot palpate cords.  Homan's is negative.   IMPRESSION:  1. Asthma/chronic obstructive pulmonary disease.  2. Stasis dermatitis, right greater than left leg, possibly on basis      of cor pulmonale.  3. Tussive pain at the right temple.  I cannot exclude the possibility      of a temporal arteritis.   PLAN:  Elevate legs, Doppler leg veins (she also wants this  done), sed  rate and CBC with diff.  She will continue present meds, including  theophylline 300 mg b.i.d., Advair 250/50, rescue albuterol inhaler used  occasionally.  No medication allergy.  Schedule return 1 month, earlier  p.r.n.     Clinton D. Maple Hudson, MD, Tonny Bollman, FACP  Electronically Signed    CDY/MedQ  DD: 04/01/2007  DT: 04/02/2007  Job #: [id number]   cc:   Lianne Bushy, M.D.

## 2011-02-03 NOTE — Discharge Summary (Signed)
NAMEESTERLENE, ATIYEH NO.:  [id number]   MEDICAL RECORD NO.:  [id number]          PATIENT TYPE:  INP   LOCATION:  2034                         FACILITY:  [facility]   PHYSICIAN:  Gerrit Friends. Dietrich Pates, MD, FACCDATE OF BIRTH:  [birth year]   DATE OF ADMISSION:  07/29/2007  DATE OF DISCHARGE:                         DISCHARGE SUMMARY - REFERRING   DISCHARGING PHYSICIAN:  Gerrit Friends. Dietrich Pates, MD.   DISCHARGE DIAGNOSIS:  Atrial fibrillation with a rapid ventricular rate  spontaneously converted to normal sinus rhythm on IV diltiazem. Asthma  exacerbation followed by pulmonary.   HISTORY:  Ms. Earll is a 77 year old female who presented with  complaining of feeling weak for the preceding month and noting more  shortness of breath than usual. She saw Dr. Harlene Salts physician assistant  and complained of pressure and tightness that had started the day prior,  told it was asthma.  However, over the weekend had nausea and vomiting  post a flu shot. She was prescribed prednisone earlier in the week.  However, an EKG on presentation to the emergency room shows atrial  fibrillation with a rapid ventricular rate.  She also had redness and  tenderness and swelling of her right lower extremity and an ultrasound  had ruled out DVT approximately 1 month ago.  She feels it is improved  on the prednisone.   PAST MEDICAL HISTORY:  Notable for hypertension, asthma, MS, rheumatoid  arthritis, remote tobacco use.   LABORATORY:  Chest x-ray on November 7 showed mild cardiomegaly,  probable COPD, no acute findings. Admission H&H was 13.8 and 40.8,  normal indices, platelets 289, WBCs 10.6. On the 10th at the time of  discharge, H&H  was 12.5 and 36.3, normal indices, platelets 261, WBCs  12.4. PTT 25, PT 13.0.  Admission sodium was 138, potassium 4.1, BUN 18,  creatinine 0.83, glucose 155, normal LFTs.  CK-MBs, relative indexes and  troponins were within normal limits x3.  Fasting lipids showed a  total  cholesterol of 195, triglycerides 143, HDL 39, LDL 127.  TSH 0.820.  Initial EKG showed atrial fibrillation with a ventricular rate of 162,  nonspecific ST-T wave changes. Subsequent EKG showed sinus rhythm,  possible left atrial enlargement, nonspecific ST-T wave changes, delayed  R-wave. Echocardiogram on the 9th revealed an ejection fraction of 55-  60% without wall motion abnormalities, mild LVH, mild focal basal septal  hypertrophy. The mean aortic gradient was 11 with a VTI of 1.42  area by  Vmax 1.42.  Aortic valve was noted not to be well seen but appeared to  be opening well.   HOSPITAL COURSE:  Mr. Pownall was admitted to [facility] and  placed on Lovenox as well as diltiazem. Pulmonary was consulted in  regards to her asthma exacerbation. Dr. Vassie Loll discontinued her [place]ro[place]  given her arrhythmia.  He noted that the patient admitted not to using  her Advair, so pulmonary reinstituted Advair 50/500 one puff b.i.d. to  optimize her pulmonary status.  They also placed her on a prednisone  taper and commented that they would prefer Xopenex rather than  albuterol.  Echocardiogram was noted overnight she said reverted to  sinus rhythm.  Dr. Antoine Poche felt that she may benefit from an outpatient  dobutamine stress test. By the 10th, Dr. Dietrich Pates noted that the patient  felt much better. Dr. Dietrich Pates agreed with possible outpatient  dobutamine or adenosine Myoview as respiratory status has improved.  He  felt that she could be discharged home.   DISPOSITION:  The patient is discharged home.   New medications include diltiazem 180 mg daily, prednisone taper. She  was also asked to finish her albuterol inhaler and then change to  Xopenex when finished.  She will continue her Lexapro 10 mg daily,  Mucinex as previously, Advair 1 puff b.i.d., Spiriva daily, Xopenex nebs  1.25 mg 4 times a day, Pepcid AC 12.5 mg daily, Tessalon Perles as  previously.  She was advised to  discontinue her Norvasc and her Theo-  dur.   She will follow up with Dr. Eden Emms on August 15, 2007 at 10:45. At  that time Dr. Eden Emms will assess her pulmonary status and decide whether  she would benefit from a dobutamine or adenosine Myoview.  She was asked  to bring all medications to all appointments.  She was also asked to  call Dr. Maple Hudson for a two-to-three week appointment for follow-up.   DISCHARGE TIME:  35 minutes.      Joellyn Rued, PA-C      Gerrit Friends. Dietrich Pates, MD, [facility]  Electronically Signed    EW/MEDQ  D:  08/01/2007  T:  08/01/2007  Job:  [id number]   cc:   Noralyn Pick. Eden Emms, MD, [facility]  Lianne Bushy, M.D.  Clinton D. Maple Hudson, MD, FCCP, FACP

## 2011-02-03 NOTE — Assessment & Plan Note (Signed)
[facility]                            CARDIOLOGY OFFICE NOTE   NAME:Holloman, KHADIJAH MASTRIANNI                        MRN:          [id number]  DATE:05/29/2008                            DOB:          1933-03-03    [name] is on my schedule today as a new patient; however, she has been  seen before.  She is a patient of Dr. Dietrich Pates.  She was in the hospital  last year and had some paroxysmal atrial fibrillation.   I believe, I saw her August 15, 2007 in followup.  She is a previous  smoker with a history of COPD and asthma.  She has mild chronic  exertional dyspnea.   She has not had any recurrences of her AFib and is not on Coumadin.   The patient has had some issues with dermatitis in the lower  extremities.  This has been despite not having any lower extremity  edema.  She sees a Armed forces operational officer and [place], but continues to have  significant pruritus and erythema in the front part of her right leg.  I  do not think it is venous insufficiency issue since it persists despite  having no lower extremity edema.  She has been on prednisone for this  dermatitis as well as her COPD.   CURRENT MEDICATIONS:  1. An aspirin.  2. Cardizem 180 a day.  3. Her inhalers.  4. She is also on low-dose prednisone.   REVIEW OF SYSTEMS:  Otherwise negative.   ALLERGIES:  She apparently had an allergy to a hypertensive medication,  but this is not documented and she is not familiar with exactly what it  is.   PHYSICAL EXAMINATION:  GENERAL:  Remarkable for an overweight white  female in no distress.  VITAL SIGNS:  Blood pressure is 150/80, pulse 60 and regular, occasional  PACs and PVCs, respiratory rate 14, afebrile.  HEENT:  Unremarkable.  NECK:  Carotids are normal without bruit.  No lymphadenopathy,  thyromegaly, or JVP elevation.  LUNGS:  Clear diaphragmatic motion.  No wheezing.  HEART:  S1 and S2 with a mild AS murmur.  PMI normal.  ABDOMEN:  Benign.  Status  post hysterectomy.  No AAA.  No tenderness.  No bruit.  No hepatosplenomegaly.  No hepatojugular reflux.  EXTREMITIES:  Distal pulses are intact with no edema.  She has an  erythematous area of rash in the front part of her right leg.  There is  some excoriations from her scratching.  Pulses are +2 bilaterally.  There is no evidence of edema or venous insufficiency.   EKG shows sinus rhythm with occasional PVC.   IMPRESSION:  1. History of paroxysmal atrial fibrillation, currently maintaining      sinus rhythm.  Continue aspirin and calcium blocker.  2. Mild aortic stenosis.  Follow up echo in a year.  Ejection fraction      normal at 65%, previous mean gradient only 11 mmHg.  3. Hypertension, currently well controlled.  Continue current dose of      hydrochlorothiazide and ramipril.  4. History of anxiety depression.  Continue Lexapro 5 mg a day.  5. History of chronic obstructive pulmonary disease.  Continue current      dose of albuterol and Advair.  Follow up primary care MD.   He will be seen back in about 6 months.  At some point, she will need to  follow up echo for AS.     Noralyn Pick. Eden Emms, MD, [facility]  Electronically Signed    PCN/MedQ  DD: 05/29/2008  DT: 05/29/2008  Job #: [id number]

## 2011-02-03 NOTE — Consult Note (Signed)
NEW PATIENT CONSULTATION   Armstrong, Rachael L  DOB:  September 01, 1933                                       06/03/2007  ZOXWR#:[id number]   HISTORY:  [name] presents today for evaluation of edema in her  right leg.  She reports that she had never had any difficulty with this  until several months ago when she noted it around the same time as  striking her pretibial area on her right leg.  She has never had any  edema in her left leg.  She denies any prior history of deep venous  thrombosis.  She has had several flare-ups of this erythema, and this is  a pattern from her mid calf down to her ankle.  She has significant  swelling when this becomes irritated.  She does not have any swelling,  and never has, in her left leg.   PAST MEDICAL HISTORY:  Significant for asthma and COPD, hypertension,  multiple sclerosis.  She is married with two children, does not smoke,  having quit 16 years ago.  Does not drink alcohol on a regular basis.   REVIEW OF SYSTEMS:  Positive for weight gain, shortness of breath with  exertion, bronchitis, asthma, wheezing, arthritic joint pain.  She has  no known drug allergies.   PHYSICAL EXAMINATION:  GENERAL:  Well-developed, well-nourished white  female appearing stated age of 66.  VITAL SIGNS:  Blood pressure is 153/89, pulse 107, respirations 18.  EXTREMITIES:  She does have 2+ dorsalis pedis pulses bilaterally.  The  left leg has no swelling.  She does not have any significant swelling in  her right leg today but reports this can be present on occasion.  She  does have skin changes well-demarcated from the level of mid calf down  to her ankle.  This does not appear to be classic for venous  hypertension or venostasis disease.  She reports this responded quite  dramatically to oral prednisone and also topical prednisone.   I have a copy of her noninvasive vascular laboratory studies from [facility] on April 01, 2007.  This  reveals no evidence of valvular  incompetence and no evidence of deep venous thrombosis bilaterally.  I  discussed this with [name].  I do not feel that this is related to  venous hypertension.  It also is somewhat odd in that she does not have  any evidence of overall fluid overload to account for this.  It does  appear to be some sort of primary dermatologic issue.  She does report  some relief with compression garments, and I have encouraged her to wear  these only if she is having swelling, would not suggest these on a  routine basis.  She is pleased with this discussion, will see [place] again  on an as-needed basis.   [name], M.D.  Electronically Signed   TFE/MEDQ  D:  06/03/2007  T:  06/06/2007  Job:  [id number]   cc:   Clinton D. Maple Hudson, MD, FCCP, FACP  Lianne Bushy, M.D.

## 2011-02-03 NOTE — Assessment & Plan Note (Signed)
HEALTHCARE                             PULMONARY OFFICE NOTE   NAME:[name], [name]                        MRN:          [id number]  DATE:05/03/2007                            DOB:          Aug 27, 1933    PULMONARY OFFICE FOLLOW-UP:   PROBLEMS:  1. Chronic asthma/COPD.  2. Multiple sclerosis.  3. Stasis dermatitis.   HISTORY:  Hot weather has not been good for her breathing.  No cough.  Some postnasal drainage.  Her primary complaint is with a mildly  edematous dermatitis, most obvious on the right ankle.  Dermatology  evaluation called it stasis dermatitis and she was treated with a  steroid ointment and elastic hose.  She said it did not improve until  she took prednisone.  It seemed to get worse when she stopped bothering  with elastic stockings.  Her COPD problems have been stable with little  sputum, no chest pain or palpitations, no blood, no adenopathy.   MEDICATIONS:  1. Norvasc 10 mg x1/2.  2. Theophylline 300 mg b.i.d.  3. Mucinex 40 mg x2.  4. Hydrochlorothiazide 25 mg x1/2.  5. Lexapro 10 mg.  6. Advair 250/50 mcg one puff b.i.d.  7. Xopenex nebulizer 1.25 mg q.i.d. p.r.n.  8. Rescue albuterol inhaler p.r.n.   No medication allergies.   OBJECTIVE:  Weight 167 pounds, BP 136/72, pulse 89.  Resting room air  saturation when she first came in was 88, rising to 92%.  Mild nasal congestion.  There is a red stocking distribution dermatitis, mainly at the right  ankle, with increased turgor or 1+ edema.  She has a definite dorsalis  pedis pulse.   Doppler leg vein studies bilaterally done at the [facility] vascular lab on  April 01, 2007, were normal with no evidence of thrombus or incompetence  bilaterally.  Pulmonary function tests on March 6 of this year showed  severe obstructive airways disease with response to bronchodilator.  FEV1 was 1.18 L (65% predicted) with FEV1/FVC ratio of 0.39.  Lung  volumes were normal.  Perfusion was  moderately reduced at 62%.  A chest  CT on December 02, 2005, was negative for pulmonary embolism but showed  COPD with mild mediastinal and hilar adenopathy as well as  atherosclerotic changes in the thoracic aorta.   IMPRESSION:  1. Asthma/chronic obstructive pulmonary disease.  2. Multiple sclerosis.  3. Stasis dermatitis, right lower extremity.  I am not sure if this is      purely related to her chronic obstructive pulmonary disease and      wonder if there may be some arterial insufficiency.  We discussed      options, which are limited, but she expressed interest in seeing a      vascular specialist.  We were able to get that appointment with Dr.      Arbie Cookey.   PLAN:  1. Blood for CBC with differential, sedimentation rate.  2. Chest x-ray.  3. Theophylline level.  4. Vascular referral, Dr. Tawanna Cooler Early, to exclude significant arterial      insufficiency causing dermatitis  in the right lower extremity.   Schedule return 4 months, earlier p.r.n.     Clinton D. Maple Hudson, MD, [facility], FACP  Electronically Signed    CDY/MedQ  DD: 05/03/2007  DT: 05/05/2007  Job #: [id number]   cc:   Lianne Bushy, M.D.  Larina Earthly, M.D.

## 2011-02-03 NOTE — Consult Note (Signed)
NAMEEMILIANA, [name]                 ACCOUNT NO.:  [id number]   MEDICAL RECORD NO.:  [id number]          PATIENT TYPE:  INP   LOCATION:  3314                         FACILITY:  [facility]   PHYSICIAN:  Oretha Milch, MD      DATE OF BIRTH:  Feb 14, 1933   DATE OF CONSULTATION:  07/30/2007  DATE OF DISCHARGE:                                 CONSULTATION   PRIMARY CARE PHYSICIAN:  Dr. Purnell Shoemaker.   PULMONOLOGIST:  Dr. Maple Hudson.   REFERRING PHYSICIAN:  Noralyn Pick. Eden Emms, MD, [facility]   HISTORY OF PRESENT ILLNESS:  [name] is a pleasant 77 year old  Caucasian woman who has been admitted for atrial fibrillation with rapid  ventricular rate which has now converted to normal sinus rhythm.  We are  consulted for optimizing her pulmonary status prior to further studies  for cardiac risk ratification.  She used to be a heavy smoker and quit  about 15 years ago.  About 8 years ago she was diagnosed with asthma.  Pulmonary function tests in March 2008 have shown severe obstructive  airway disease with an FEV1 of 1.18 liters (65% predicted) and FEV1/FVC  ratio of 0.39.  There was significant bronchodilator response to which  would be consistent with asthma.  Of note, chest CT in March 2007 has  been negative for pulmonary embolism.  She has been maintained on a  regimen of theophylline, Advair 250/50 mg 1 puff b.i.d. and Spiriva with  a rescue of Albuterol inhaler and Xopenex nebs as needed.  However, she  admits that she has not been taking her Spiriva and has not been  compliant with her Advair, either.  She started feeling unwell about  more than a week ago after taking a flu shot.  She saw her primary  medical physician and was given a Prednisone taper.  However, about 2  days ago she developed chest pressure with tightness and was brought to  the emergency room where she was found to be in rapid atrial  fibrillation.  She was converted to sinus rhythm with IV digoxin and low  dose beta-blocker.  She reports  wheezing and dyspnea today requiring  nebs.   PAST MEDICAL HISTORY:  1. COPD/asthma.  2. Stasis dermatitis.  3. Hypertension.  4. Multiple sclerosis followed by Dr. Orma Render. Anne Hahn.  5. Diverticular disease status post colonoscopy in 2004.  6. L5-S1 radiculopathy.   PAST SURGICAL HISTORY:  1. L5 diskectomy in 2003.  2. Hysterectomy and right breast cyst removal.   MEDICATIONS AT HOME:  Include:  1. __________ 300 mg per day.  2. Advair 250/50 mg 1 puff b.i.d.  3. Spiriva inhaler daily.  4. Albuterol metered dose inhaler every 6 hours p.r.n. for rescue.  5. Xopenex nebs 1.25 mg per mL 4 times a day.  6. Lexapro 10 mg daily.  7. Norvasc 5 mg once a day.  8. Pepcid AC 12.5 mg daily.  9. Tessalon Perles as needed.  10.Mucinex 20 mg b.i.d.   SOCIAL HISTORY:  She smoked about a pack per day for 40 years before she  quit  about 12 years ago.  She lives with her husband in [place], [place].  Last travel was a cruise to [place] about a year ago.   FAMILY HISTORY:  Her daughter has multiple sclerosis.  Her brother had  lung cancer.   REVIEW OF SYSTEMS:  Reports dyspnea with wheezing.   ALLERGIES:  XANAX causes myalgias and shortness of breath.   PHYSICAL EXAMINATION:  GENERAL:  Adult woman sitting up in bed in mild  respiratory distress.  VITAL SIGNS:  Heart rate 72 beats per minute. __________ Blood pressure  132/84, respirations 18 per minute, oxygen saturation 95% on 2 liters  nasal cannula.  HEENT:  No post nasal drip.  No thrush.  CVS:  S1, S2 regular.  CHEST:  Decreased breath sounds both bases.  Faint expiratory rhonchi.  ABDOMEN:  Soft and nontender.  NEUROLOGICAL:  Nonfocal.  EXTREMITIES:  No edema.   LABORATORY DATA:  Troponin 0.02, WBC count 10.6, hemoglobin 13.8,  platelets 289, potassium 4.4, bicarbonate 29, BUN/creatinine 18/0.83,  albumin 3.7.   IMPRESSION:  1. Chronic obstructive pulmonary disease/asthma exacerbation.  2. Atrial fibrillation with  a rapid ventricular rate.   RECOMMENDATIONS:  1. Given her recent arrhythmia, I would discontinue her Theo-Dur,      although she has been maintained on this for many years.  2. She admits to not using her Advair but does state that she will try      to be more compliant with this from now on.  I would reinstitute      Advair to 50/500 discus 1 puff b.i.d.  Due to the need optimize her      pulmonary status quickly so that cardiac studies can be performed,      I will put her on p.o. Prednisone 40 mg and taper it over the next      week depending on improvement.  3. Given the atrial fibrillation I would prefer the use of Xopenex      rather than Albuterol.  4. She states that she was not significantly improved with Spiriva,      although we now know from the recent uplift study that this would      mean that Spiriva is safe long term even in patient's with coronary      artery disease, I am not sure that she would benefit from this      given the lack of symptomatic improvement other than improvement in      exercise tolerance.  I would defer this to Dr. Maple Hudson as an      outpatient.   Thank you, Dr. Eden Emms, for involving [place] in the care of this patient.  We  will help with optimizing her pulmonary status prior to cardiac studies.      Oretha Milch, MD  Electronically Signed     RVA/MEDQ  D:  07/30/2007  T:  07/30/2007  Job:  [id number]   cc:   Joni Fears D. Maple Hudson, MD, FCCP, FACP

## 2011-02-03 NOTE — H&P (Signed)
[name], [name]                 ACCOUNT NO.:  [id number]   MEDICAL RECORD NO.:  [id number]          PATIENT TYPE:  INP   LOCATION:  3314                         FACILITY:  [facility]   PHYSICIAN:  Gerrit Friends. Dietrich Pates, MD, FACCDATE OF BIRTH:  10-May-1933   DATE OF ADMISSION:  07/29/2007  DATE OF DISCHARGE:                              HISTORY & PHYSICAL   PRIMARY CARDIOLOGIST:  Dr. Purnell Shoemaker.   PULMONOLOGIST:  Dr. Maple Hudson.   HISTORY OF PRESENT ILLNESS:  This is an obese 77 year old Caucasian  female with no known history of coronary artery disease, with complaints  of feeling weak x1 month, with more shortness of breath.  The patient  has a long history of asthma and hypertension, but has been feeling more  tired and more short of breath over the last month.  She did see the PA  in Dr. Harlene Salts office a week ago, with complaints of pressure,  tightness, and rapid heart rate.  She was told that it was related to  her asthma.  The patient was also started on prednisone recently, and  has had a metered dose, which she is finishing up.  The patient also had  complaints of nausea and vomiting over the weekend, but she felt some  better.  She did have a recent flu shot.  The patient returned to Dr.  Harlene Salts office with chest discomfort, tightness, and had an EKG  completed, revealing atrial fibrillation with RVR, ventricular rate of  170 beats per minute.  EMS was called, and the patient was transported  to [facility] ER.  En route, the patient was given 20 mg of Cardizem IV,  and we are seeing her with heart rate now of 167 beats per minute with a  blood pressure of 90/46.  She complains of some continued chest  pressure, but no frank severe pain.  She is not short of breath.  She  has no complaints of dizziness or lightheadedness at present.   PAST MEDICAL HISTORY:  Includes hypertension, asthma, multiple  sclerosis, and rheumatoid arthritis.   PAST SURGICAL HISTORY:  Hysterectomy and breast  cyst removal.   SOCIAL HISTORY:  She lives near [place], [place], with her  husband.  She is married with a son and daughter.  She used to smoke,  but has stopped for 15 years.  No ETOH use.   FAMILY HISTORY:  Mother died of old age, greater than 33 years old.  Her father died of complications from a ruptured appendix at age 70.  She has one sister who has had CABG x4 at age 65.  She has had one  brother with MI, and a brother with AAA.   CURRENT MEDICATIONS:  Norvasc 10 mg daily, theophylline 300 mg daily,  Mucinex 40 mg daily, hydrochlorothiazide 12.5 mg daily, Lexapro 10 mg  daily, Advair 250/50 mg b.i.d., calcium supplement once a day,  prednisone dosepak daily.   ALLERGIES:  No known drug allergies.   LABORATORY DATA:  Pending.   PHYSICAL EXAMINATION:  VITAL SIGNS:  Blood pressure 90/56, heart rate  167, respirations 22.  O2 saturation 95% on 2 liters.  HEENT:  Head normocephalic, atraumatic.  Eyes:  PERRLA.  Mucous  membranes of the mouth are pink and moist.  Tongue midline.  NECK:  Supple, obese.  No JVD.  No carotid bruits appreciated.  CARDIOVASCULAR:  Irregular rhythm, rapid, without murmurs, rubs, or  gallops.  LUNGS:  Clear to auscultation.  No evidence of wheezes, rales, or  rhonchi on auscultation.  ABDOMEN:  Obese, nontender, with 2+ bowel sounds.  EXTREMITIES:  No clubbing, cyanosis, edema or rash.  NEUROLOGIC:  Cranial nerves II-XII are grossly intact.   IMPRESSION:  1. Atrial fibrillation/flutter with rapid ventricular response.  2. History of asthma on prednisone and theophylline.  3. Hypertension.   PLAN:  The patient has been seen and examined in the [facility] ER by  myself and Dr. [place] Bing.  The patient has no prior cardiovascular  history, but does have a history of hypertension, malaise x1 month,  tachycardia x1 week, but no EKG done at that time.  Now, the patient has  increased dyspnea with anterior chest pressure.  The exam shows  some  minimal edema, decreased breath sounds, but no wheezes.  EKG reveals a-  flutter with 2:1 block, with no acute ST-T wave changes.   The patient has atrial arrhythmias with rapid ventricular response with  relative hypotension.  We will add digoxin 0.5 mg IV, start her on  Lovenox and give one dose of beta-blocker.  Will consider [facility]  cardioversion if the patient is refractory to medical management.  The  patient's symptoms hopefully are secondary to elevated heart rate, but  we will ultimately need to rule out CAD.  Moderate thromboembolism risk.  Will start anticoagulation for now, and follow closely.      Bettey Mare. Lyman Bishop, NP      Gerrit Friends. Dietrich Pates, MD, [facility]  Electronically Signed    KML/MEDQ  D:  07/29/2007  T:  07/29/2007  Job:  [id number]   cc:   Lianne Bushy, M.D.  Clinton D. Maple Hudson, MD, FCCP, FACP

## 2011-02-06 NOTE — H&P (Signed)
[name], [name] NO.:  [id number]   MEDICAL RECORD NO.:  [id number]          PATIENT TYPE:  INP   LOCATION:  5035                         FACILITY:  [facility]   PHYSICIAN:  Asencion Partridge, M.D.     DATE OF BIRTH:  20-Sep-1933   DATE OF ADMISSION:  12/07/2005  DATE OF DISCHARGE:                                HISTORY & PHYSICAL   PRIMARY CARE PHYSICIAN:  Dr. Purnell Shoemaker   CHIEF COMPLAINT:  Asthma exacerbation.   HISTORY OF PRESENT ILLNESS:  This is a 77 year old who presented to her  primary care physician for asthma exacerbation after returning from a cruise  one week ago.  She stated she completed a course of Levaquin 500 x7 days as  well as a recent prednisone 20 mg taper over the last seven days.  However,  upon finishing that taper on yesterday she had no improvement in her  symptoms and returned to her primary care office today.  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  She denies any fever, no chest pain, and states she is usually  able to ambulate around the mall without difficulty with no walking devices.  She never had any previous intubations for her asthma and she is uncertain  of her flares in the past, but usually states it is from a cold.  She had a  chest x-ray done by her primary care physician per report which was negative  for infiltrate and had a chest CT done on December 02, 2005 which was negative  for pulmonary embolism.   PAST MEDICAL HISTORY:  1.  Hypertension.  2.  Diverticular disease status post colonoscopy in 2004.  3.  L5-S1 radiculopathy.  4.  Multiple sclerosis followed by Dr. Anne Hahn.   PAST SURGICAL HISTORY:  1.  Hysterectomy.  2.  Right breast cyst that was removed.  3.  L5 diskectomy in 2003.   MEDICATIONS:  1.  Advair 500/50 b.i.d.  2.  Theo-Dur daily.  3.  Lexapro 10.  4.  Norvasc 10 one-half tablet daily.  5.  Albuterol MDI q.4-6h. p.r.n. which was recently  changed to Xopenex 1.25      q.i.d.  6.  Mucinex.  7.  HCTZ 25 one-half tablet daily.  8.  Tessalon Perles as needed for shortness of breath.   SOCIAL HISTORY:  [place]  Enjoys baking cakes and has three cats.  She does have a remote  history of tobacco use and states that she quit 15 years ago and prior to  that smoked about a pack per day for 20 years.   FAMILY HISTORY:  Significant for a grandfather who also had asthma.  No  diabetes.  There is some hypertension in the family.  She has a brother who  had lung cancer as well as a sister who had heart disease at age 61.   REVIEW OF SYSTEMS:  Negative for chest pain.  No swelling.  No PND.  ALLERGIES:  XANAX which causes shortness of breath as well as myalgias.   PHYSICAL EXAMINATION:  VITAL SIGNS:  Temperature 97.2, heart rate 103, blood  pressure 148/77, respiratory rate 18, pulse ox 94% on room air.  GENERAL:  The patient is speaking in complete sentences with occasional  gasping.  HEART:  Regular rate and rhythm, but tachy to 105.  No murmurs and no  carotid bruits.  PULMONARY:  The patient is tight with end-expiratory wheezes.  EXTREMITIES:  No edema.  No pain on dorsiflexion.  No swelling or erythema  of her legs.   LABORATORY DATA:  Pending at this time includes CBC, BMET, and chest x-ray.   ASSESSMENT/PLAN:  1.  Dyspnea.  Differential diagnosis is extensive including acute myocardial      infarction, pneumonia, pneumothorax, pleuritis, pulmonary embolism,      tamponade, pleural effusion, asthma exacerbation, chronic obstructive      pulmonary disease exacerbation, congestive heart failure exacerbation,      etc.  However, from a pulmonary standpoint it is more likely that the      patient has chronic obstructive pulmonary disease/asthma exacerbation,      although she is saturating 94% on room air.  She is not tachypneic at      this time.  We will repeat a chest  x-ray to evaluate for any structural      abnormality versus infiltrate or changes that would be consistent with      chronic obstructive pulmonary disease.  Since the patient's management      did improve with prednisone we will place her back on prednisone 60 mg      for five days.  Pending the read of the chest x-ray we may consider      adding on doxycycline or Avelox for bronchitis-type picture.  At this      point we will schedule albuterol and Atrovent nebulizers 2.5/0.5 mg      q.4h./q.2h. p.r.n.  Will continue Mucinex.  The patient did have a      negative CT of the chest on March 14 which was negative for pulmonary      embolism.  However, if symptoms do persist we will consider this in the      differential.  2.  From a cardiac standpoint we will obtain an EKG to rule out evidence of      right heart strain or any ischemic changes, although she has no history      of chest pain or heart disease.  3.  Hypertension.  We will continue the patient's home medications, Norvasc      10 mg a half tablet daily as well as HCTZ 25 half tablet daily.  We will      follow up on her BMET to make sure that her electrolytes are stable.  4.  For multiple sclerosis this is stable at this time and the patient is      not on any medications and is followed by Dr. Anne Hahn.  We will read to      see if prednisone or multiple sclerosis is related to asthma flares.  5.  Depression.  Stable at this time.  No suicidal ideation, no homicidal      ideation.  The patient has been on Lexapro 10 and we will continue this.  6.  Fluids, electrolytes, and nutrition.  We will med lock the patient and      follow up with her BMET and  CBC.  She will be placed on a low salt diet.  7.  For prophylaxis we will continue the patient on Protonix 40 daily as      well as SCDs to the knee.  8.  Dispo.  We will continue to follow the patient to her baseline     respiratory status.     Alanson Puls, M.D.     ______________________________  Asencion Partridge, M.D.   MR/MEDQ  D:  12/07/2005  T:  12/08/2005  Job:  [id number]

## 2011-02-06 NOTE — Discharge Summary (Signed)
[name], [name]                 ACCOUNT NO.:  [id number]   MEDICAL RECORD NO.:  [id number]          PATIENT TYPE:  INP   LOCATION:  1336                         FACILITY:  [facility]   PHYSICIAN:  Clinton D. Maple Hudson, MD, FCCP, FACPDATE OF BIRTH:  [birth year]   DATE OF ADMISSION:  10/20/2007  DATE OF DISCHARGE:  10/24/2007                               DISCHARGE SUMMARY   DISCHARGE DIAGNOSES:  1. Chronic obstructive pulmonary disease with acute bronchitic      exacerbation.  2. Hypertension not otherwise specified.  3. Atrial fibrillation.  4. Esophageal reflux.  5. Acute urinary infection not otherwise specified.  6. Multiple sclerosis.   BRIEF HISTORY:  A 77 year old woman with asthma/COPD, history atrial  fibrillation and hypertension who presented through the office with 1-  week history of productive cough, thick mucus, wheezing, dyspnea and  malaise, such that she got to where she could not sleep at night and  could only walk a few feet without giving out.  She had a negative chest  x-ray 2 days prior in the office at her family physician's where she was  given a Medrol Dosepak and Augmentin but she did not improve and  actually worsened.   PAST MEDICAL HISTORY:  1. Stasis dermatitis.  2. Multiple sclerosis.  3. COPD.  4. Asthma.  5. Atrial fibrillation.   PHYSICAL EXAM:  Significant for coarse breath sounds, regular heart  sounds, no edema.  Pulse was 74, BP 132/82, room air saturation 92%.   HOSPITAL COURSE:  She gave a distinct history of exposure to someone  with a viral respiratory illness at home 2 days prior and was treated as  an acute bronchitic exacerbation on that basis, giving nebulized  bronchodilators and steroids.  She was treated with Xopenex and  Atrovent, intravenous Solu-Medrol.  She developed hyperglycemia on  steroids and was covered with sliding scale.  She gradually improved.  There had been some question of upper airway obstruction not  subsequently noted but she was able to discharge home with condition  improved to office follow-up.   LABORATORY:  Admission WBC 6900 with hemoglobin of 14, left shift  neutrophil 89%.  Initial glucose 155.  Liver functions normal.  Beta  natriuretic peptide normal 118.  Urinalysis negative.  She was not able  to produce sputum.  EKG showed sinus rhythm with frequent premature  ventricular contractions.  Chest x-ray showed chronic pulmonary  parenchymal changes without acute or superimposed abnormality.   DISCHARGE PLANS:  Diet and activity as tolerated, unrestricted with  activity to build as tolerated.  She is to follow up in the office with  Dr. Maple Hudson in 2 weeks and to see Dr. Purnell Shoemaker in follow-up for primary care.   MEDICATION:  1. Atacand 16 mg daily.  2. Nebulizer with Xopenex 1.25 mg q.i.d. p.r.n.  3. Atrovent rescue inhaler q.i.d. p.r.n.  4. Spiriva once daily.  5. Advair 250/50 one puff b.i.d.  6. Mucinex DM two tablets twice daily.  7. Lexapro 10 mg.  8. Aspirin 81 mg.  9. Pepcid AC 12.5 mg.  10.Hydrochlorothiazide 12.5  mg.  11.Prednisone to taper using 10 mg tablets #20 to take one q.i.d. for      2 days, t.i.d. for 2 days, b.i.d. for 2 days, daily for 2 days, and      then stop.      Clinton D. Maple Hudson, MD, [facility]oll[facility], FACP  Electronically Signed     CDY/MEDQ  D:  11/19/2007  T:  11/20/2007  Job:  [id number]   cc:   Lianne Bushy, M.D.  Fax: [phone]

## 2011-02-06 NOTE — Op Note (Signed)
   NAMECAMYRA, [name]                           ACCOUNT NO.:  [id number]   MEDICAL RECORD NO.:  [id number]                   PATIENT TYPE:  AMB   LOCATION:  ENDO                                 FACILITY:  [facility]   PHYSICIAN:  James L. Malon Kindle., M.D.          DATE OF BIRTH:  03/16/1933   DATE OF PROCEDURE:  04/18/2003  DATE OF DISCHARGE:                                 OPERATIVE REPORT   PROCEDURE:  Colonoscopy.   MEDICATIONS:  Fentanyl 87.5 mcg, Versed 7 mg IV.   INDICATIONS FOR PROCEDURE:  A patient with a history of colon polyps and  extensive history of colon polyps and cancer in the family. This is done as  a followup. She is known to have extensive diverticular disease.   SCOPE:  Olympus adjustable pediatric colonoscope.   DESCRIPTION OF PROCEDURE:  The procedure had been explained to the patient  and consent obtained. With the patient in the left lateral decubitus  position, the Olympus scope was inserted and advanced. The patient had  extensive diverticular disease. Multiple maneuvers including position  changes were required to pass the sigmoid colon. After we were able to pass  the sigmoid colon, we were able to advance easily to the cecum. The  ileocecal valve and appendiceal orifice were seen. The scope was withdrawn  and the cecum, ascending colon, transverse colon, descending and sigmoid  colon were seen well upon removal. No colon polyps were seen, extensive  diverticular disease in the sigmoid colon. The rectum was free of polyps.  The patient tolerated the procedure well and was resting comfortably at the  termination of the procedure. She was monitored by O2 saturations and was  given oxygen throughout the procedure.   ASSESSMENT:  1. No evidence of further colon polyps.  2. Severe diverticular disease.   PLAN:  1. Will recommend repeating in three years due to her strong family history     and difficulty seeing the sigmoid colon well and a previous  history of     colon polyps.  2. Will give a diverticulosis information sheet.  3. Will recommend yearly Hemoccults.                                               James L. Malon Kindle., M.D.   [name]  D:  04/18/2003  T:  04/18/2003  Job:  [id number]   cc:   Lianne Bushy, M.D.  [address]  [place]  [place] [address]  Fax: [phone]

## 2011-02-06 NOTE — Assessment & Plan Note (Signed)
[facility]                             PULMONARY OFFICE NOTE   NAME:Ding, ALAYSHA JEFCOAT                        MRN:          [id number]  DATE:10/25/2006                            DOB:          27-Sep-1932    PROBLEM:  Asthma.   HISTORY:  This is a previous patient from [facility], last seen by me there years ago.  She is sent now through the  courtesy of Dr. Purnell Shoemaker, concerned about persistent wheezing and dyspnea.  She says that she had had an occasional episode of bronchitis, but  nothing sustained or significant until she got sick as she was returning  from a [place] cruise in February 2007.  The ship's doctor told her  that she had mucus plugging and gave intravenous steroid and antibiotic.  On return home, she saw Dr. Purnell Shoemaker and ended up needing hospitalization  in March 2007.  Chest CT on December 02, 2005 at [facility] was negative for  pulmonary embolism but showed changes suggestive of COPD, with mild  mediastinal and bihilar adenopathy, as well as atherosclerotic changes  in the thoracic aorta.  There was chronic-appearing peribronchial  thickening.  She was discharged at that time on prednisone and did  better.  She needed another prednisone taper about a month ago, and has  been put on another, which she is currently following.  She feels  distinctly better on prednisone.  Dr. Harlene Salts office had obtained an  oxygen saturation of 87% on room air at a time when she was very  congested.  Dr. Purnell Shoemaker had been good enough to call me, describing  asymmetrical wheezing more obvious in the right chest.  She still  notices easy exertional dyspnea, with no heartburn, palpitations, or  exertional chest pain, nothing purulent or bloody.  She did cough up one  rubbery mucus plug.  She has wanted to stay physically active.  There is  no history of diabetes.  She cares for her husband, who is demented and  had a Staphylococcus infection after  back surgery.  She has had  pneumococcal vaccine at least twice and flu vaccine this year.  Dr.  Purnell Shoemaker had done a chest x-ray on December 01, 2005 which showed numerous  small metallic rounded densities overlying the left upper chest.  These  were not seen on a film of November 03, 2005.  He explained in his phone  conversation to me that it turned out these were sequins on a t-shirt  she was wearing, although they certainly looked like bird shot.   MEDICATIONS:  1. Norvasc 10 mg x 1/2.  2. Theophylline 300 mg, 1 or 2 daily.  3. Mucinex 40 mg x2.  4. HCTZ 1/2 x 25 mg.  5. Lexapro 10 mg.  6. Advair 500/50.  7. Home nebulizer with Xopenex 1.25 mg.  8. Rescue albuterol inhaler.  9. Medrol taper.   No medication allergy.   OBJECTIVE:  VITAL SIGNS:  Weight 161 pounds, BP 162/94, pulse 94, room  air saturation 92%.  RESPIRATORY:  Congested wheeze bilaterally.  She is not using accessory  muscles.  There is no stridor, obvious postnasal drainage, or neck vein  distention.  HEART:  Sounds regular, without murmur or gallop.  EXTREMITIES:  Without edema, cyanosis, clubbing, or tremor.   IMPRESSION:  1. Chronic asthma, with exacerbation apparently by a viral-type      illness on her [place] trip a year ago.  2. Additional medical problem of multiple sclerosis, volunteered by      patient.   PLAN:  1. Reschedule pulmonary function tests.  2. She will finish her steroid taper.  3. Continue Advair 500/50 (consider Symbicort).  4. Try sample Spiriva 1 inhalation daily, with medication talk.  5. Schedule to return in one month, earlier p.r.n.     Clinton D. Maple Hudson, MD, [facility]ollman, FACP  Electronically Signed    CDY/MedQ  DD: 10/25/2006  DT: 10/26/2006  Job #: [id number]   cc:   Lianne Bushy, M.D.

## 2011-02-06 NOTE — Discharge Summary (Signed)
NAMEKYNLIE, Rachael NO.:  [id number]   MEDICAL RECORD NO.:  [id number]          PATIENT TYPE:  INP   LOCATION:  5035                         FACILITY:  [facility]   PHYSICIAN:  Asencion Partridge, M.D.     DATE OF BIRTH:  1933/08/29   DATE OF ADMISSION:  12/07/2005  DATE OF DISCHARGE:  12/09/2005                                 DISCHARGE SUMMARY   ADMISSION DIAGNOSES:  1.  Asthma exacerbation.  2.  Hypertension.  3.  Multiple sclerosis.  4.  History of L5-S1 radiculopathy.  5.  History of diverticular disease with colonoscopy in 2004.  6.  Status post hysterectomy.  7.  Status post right breast cyst removal.  8.  Status post L5 discectomy in 2003.   DISCHARGE DIAGNOSES:  1.  Chronic obstructive pulmonary disease exacerbation.  2.  Asthma exacerbation.  3.  Hypertension.  4.  Depression.  5.  Multiple sclerosis.   PROCEDURES:  Chest x-ray on December 07, 2005, which showed a normal heart  size. There is no evidence of heart failure or infiltrate or infusion. There  are changes consistent with COPD. There is no active disease.   CONSULTATIONS:  None.   HOSPITAL COURSE:  Ms. Loyola is a 77 year old who presented from her primary  care physician's office today, Dr. Purnell Shoemaker in [place], [place], for  asthma exacerbation after returning from a cruise one week ago. The patient  states there were several sick contacts on the cruise and that she felt she  had an upper respiratory infection and was treated with a course of Levaquin  500 times 7 days as well as a recent prednisone taper 20 milligrams over the  past 7 days. The patient states that her symptoms did improve but once she  was off the prednisone, she returned to her primary care physician for  increasing shortness of breath. She denied any chest pain at this time and  denied any PND or orthopnea or congestive heart failure symptoms. The  patient was seen by her primary care physician a few days prior and had  a  chest x-ray done which was negative per the primary physician's report and  once this did not improve, a CT of the chest was performed on December 02, 2005, which was negative for pulmonary embolism. The patient was admitted  for failure of outpatient treatment for asthma exacerbation. Upon  presentation, the patient was satting 92-94% on room air and was breathing  about 20-22 times per minute with occasional gasping, however, she did  appear stable.  For her asthma exacerbation, a repeat chest x-ray was  obtained to rule out any infiltrate which was negative as documented above.  The patient was placed back on prednisone 60 mg tab to complete a total of a  14 day taper, see discharge medications for specific instructions. She was  also given albuterol and Atrovent nebulizers scheduled q.4, this was spaced  out to albuterol MDI q.6h. as needed p.r.n. The patient still remained on  room air but stated that she felt short of breath with exertion.  Therefore,  we walked the patient in the hallway and found that saturations did decrease  to 89% on room air.  We thought these changes were consistent with COPD. She  did have a remote tobacco history and states that she quit smoking 15 years  ago. We also placed the patient on Advair 500/50 as well as Spiriva 18 mcg 1  puff daily. She continued on Theophylline 300 mg daily. Theophylline level  was obtained which was 9.  Of note, patient's goal should be 8 to 9,  therefore she was continued on his per her primary care physician. Prior to  discharge the patient was still speaking in complete sentences, had no acute  signs of respiratory distress. She will, however, need formal pulmonary  function testing on an outpatient basis to look at her FEV1 and we also  recommend peak flow readings. Of note, the respiratory therapist did do peak  flows with her with a predicted being 416, the patient was actually 37% of  predicted with a flow of 150.    Problem 2:  Hypertension. This was stable throughout this hospitalization  and her blood pressure ranged from 125 to 138 over 72 to 87 and a heart rate  of 85-87.  She continued on Norvasc 5 as well as HCTZ 12.5 daily at her home  dose and her last potassium was stable at 4.2.   Problem 3:  Multiple sclerosis.  This is stable and was also stable  throughout this hospitalization. The patient did state that she had a  history of ocular involvement in the past and is followed by Dr. Anne Hahn, her  neurologist.   Problem 4:  Depression. This was also stable throughout this  hospitalization. She denied any suicidal ideation or homicidal ideation and  continued on Lexapro 10.   Problem 5:  Fluid, electrolyte, and nutrition. The patient was met locked on  her fluids, electrolytes were stable. She has been compliant with a low salt  diet. She was placed on Protonix prophylactically here and we encouraged  early ambulation for DVT prophylaxis.   DISCHARGE MEDICATIONS:  Include the following:  1.  Advair 500/50 1 puff b.i.d.  2.  Spiriva 18 mcg 1 puff daily.  3.  Theophylline 300 mg p.o. daily.  4.  Albuterol MDI 1-2 puffs every 4-6 hours as needed for severe wheezing      with a spacer.  5.  Norvasc 5 mg daily.  6.  HCTZ 12.5 mg daily.  7.  Lexapro 10 mg daily.  8.  Prednisone 60 mg for two more days until December 11, 2005, and then 40 mg      for three days, between March 24 to March 27, then 20 mg for four days      between March 28 to April 1, and then 10 mg for three days between April      2 to April 5, and then the patient will complete her 14 day course of      prednisone taper.   DISCHARGE INSTRUCTIONS:  She is to return to Dr. Purnell Shoemaker for follow up  appointment within the next 3-4 days. She was instructed to return to the  emergency department or clinic for fever, difficulty breathing, chest pain  or any other concerns.      Alanson Puls,  M.D.   ______________________________  Asencion Partridge, M.D.    MR/MEDQ  D:  12/09/2005  T:  12/10/2005  Job:  [id number]

## 2011-02-06 NOTE — Assessment & Plan Note (Signed)
 HEALTHCARE                             PULMONARY OFFICE NOTE   NAME:Schicker, TEMPLE EWART                        MRN:          [id number]  DATE:11/25/2006                            DOB:          1933/03/11    PROBLEM:  1. Chronic asthma.  2. Multiple sclerosis.   HISTORY:  She returns now saying she feels fine today.  She is due for a  CT of the abdomen for question of gallstones.  She did have productive  cough after last visit and chest has remained clear.  Likes Spiriva.   MEDICATIONS:  1. Norvasc 5 mg.  2. Theophylline 300 mg.  3. Mucinex 40 mg x2.  4. HCTZ one half x25 mg.  5. Lexapro 10 mg.  6. Advair 500/50.  7. Xopenex 1.25 mg.  8. P.r.n. use of an albuterol inhaler and steroid taper.   ALLERGIES:  No medication allergy.   OBJECTIVE:  Weight 161 pounds, BP 120/80, pulse 90, room air saturation  92%.  Chest sounds clear.  Heart sounds are normal.  No edema.   PULMONARY FUNCTION TEST:  Severe obstructive airways disease with an FEV-  1 after bronchodilator of 1.18 liters (65% predictive).  Her FEV-1/FVC  ratio was only 0.39.  Lung volumes were normal.  Diffusion was  moderately reduced, suggesting an emphysema pattern in this former  smoker.  There is some response to bronchodilator.   IMPRESSION:  Asthma/chronic obstructively pulmonary disease with a  significant component of emphysema.   PLAN:  1. Try changing Advair from 500 to 250/50 to reduce cost.  2. Try reducing Spiriva to p.r.n., again because of her concerns about      cost.  3. Schedule return in four months.  I anticipate a theophylline level      on return.     Clinton D. Maple Hudson, MD, Tonny Bollman, FACP  Electronically Signed    CDY/MedQ  DD: 11/28/2006  DT: 11/28/2006  Job #: [id number]   cc:   Lianne Bushy, M.D.

## 2011-02-06 NOTE — Op Note (Signed)
NAMEWELMA, MCCOMBS                           ACCOUNT NO.:  [id number]   MEDICAL RECORD NO.:  [id number]                   PATIENT TYPE:  INP   LOCATION:  3005                                 FACILITY:  [facility]   PHYSICIAN:  Coletta Memos, M.D.                  DATE OF BIRTH:  [birth year]   DATE OF PROCEDURE:  09/16/2002  DATE OF DISCHARGE:                                 OPERATIVE REPORT   PREOPERATIVE DIAGNOSES:  1. Displaced disc, right L5-S1.  2. L5 radiculopathy.   POSTOPERATIVE DIAGNOSES:  1. Displaced disc, right L5-S1.  2. L5 radiculopathy.   PROCEDURE:  Right L5 semi-hemilaminectomy and diskectomy with  microdissection.   COMPLICATIONS:  None.   SURGEON:  Coletta Memos, M.D.   ANESTHESIA:  Endotracheal.   INDICATIONS FOR PROCEDURE:  The patient is a 77 year old with multiple  sclerosis whom for the last week has had pain in the back and right lower  extremity.  It became excruciating over the last three days.  An MRI  performed September 13, 2002 showed an extremely large disc herniation at L5-  S1.  Secondary to her desire to match our epidural steroids which had been  planned, she came in today, Saturday, for an urgent diskectomy.   OPERATIVE NOTE:  The patient is brought to the operating room, intubated and  placed under general anesthesia without difficulty.  She is rolled prone  onto a Wilson frame and all pressure points are properly padded.  Her back  was prepped and she was draped in sterile fashion.  I took a localizing film  and using that as a guide made my skin incision.  I made the skin incision  with a #10 Bard-Parker scalpel, and I took this down to the thoracolumbar  fascia.  I then created a semicircular flap in the thoracolumbar fascia  dissecting medially.  I then in a subperiosteal fashion exposed the lamina  of L5 and of S1.  Took another x-ray and it showed I was working in the  correct intralaminar space.  I then performed a  semi-hemilaminectomy of L5  using a high-speed drill.  Then with the microscope wheeled into position, I  used the microscope to aid in microdissection of the L5-S1 disc space.  I  removed the ligamentum flavum exposing the thecal sac.  I then retracted the  S1 nerve root medially and did not find the discus fragment.  The disc space  was seen and it was intact.  There was no herniation at the disc space, but  the MRI showed that there was a laterally placed fragment which had migrated  rostrally.  I then used nerve hooks and was able to choose out a significant  amount of disc material from the neural foramen and also from the lateral  rostral aspect of the spine at L5.  When I felt that I had adequately  decompressed I then irrigated.  I then inspected one more time using a  microscope.  I did feel that I had decompressed the nerve root and removed  all of the free fragment.  The disc space was cauterized but I did not open  it, as I did not see a large hole or space for more fragments.  I then took  the microscope out of the wound.  I closed the wound in a layered fashion  reapproximating my fascial flap with Vicryl sutures and the subcutaneous  with 6-0 Vicryl sutures.  Dermabond used for sterile dressing.  Patient was  then rolled supine, extubated and was moving both lower extremities.                                               Coletta Memos, M.D.    KC/MEDQ  D:  09/16/2002  T:  09/16/2002  Job:  [id number]

## 2011-02-06 NOTE — H&P (Signed)
NAMEEUSTOLIA, Armstrong                             ACCOUNT NO.:  [id number]   MEDICAL RECORD NO.:  [id number]                    PATIENT TYPE:   LOCATION:                                       FACILITY:   PHYSICIAN:  Coletta Memos, M.D.                  DATE OF BIRTH:  [birth year]   DATE OF ADMISSION:  09/16/2002  DATE OF DISCHARGE:                                HISTORY & PHYSICAL   ADMITTING DIAGNOSIS:  Displaced disk, right L5/S1 lumbar radiculopathy and  multiple sclerosis.   INDICATIONS:  [name] is a 77 year old who has had a two week history of  severe pain in the back and right lower extremity which has gotten worse  over the last few days.  She had an MRI which was ordered by Dr. Channing Mutters on  12/24 and then repeat study on 12/26 showed abnormal views.  After speaking  with Dr. Channing Mutters, they had decided to try epidural steroid injections.  However,  she called me today via the answering service, because she is in a  significant amount of pain.  The family said that she cannot walk and at  this point in time she decided she just wanted to go ahead with surgery.  That being the case, I told her to come in.  I reviewed the MRI and it  showed a large disk fragment which had migrated behind the body of L5 and it  was laterally placed.  [name] then came in for lumbar laminectomy and  diskectomy.   PAST MEDICAL HISTORY:  Multiple sclerosis.  She does have a history of  asthma, hypertension.  She has a hysterectomy and also had a cyst removed  from the right breast.   CURRENT MEDICATIONS:  Hydrochlorothiazide 25 mg 1/2 tablet once a day,  Norvasc 5 mg every day, Hydrocodone for pain, methylprednisolone for pain  and an Advair inhaler.   REVIEW OF SYSTEMS:  She does wear glasses and has partial upper and lower  dentures.  She has had some GI upset as a result of taking the pain  medication.   PHYSICAL EXAMINATION:  VITAL SIGNS:  Temperature 98.3, pulse 98, respiratory  rate 20,  blood pressure 184/98.  GENERAL:  She is 5 feet, 4 inches tall, approximately 150 pounds.  She is  alert, oriented x4 and answering all questions appropriately.  She is in  good spirits, lying on the bed on the neurological floor.  EXTREMITIES:  Dorsiflexion and the extensor hallucis longus about 4/5.  Normal studies elsewhere in the lower extremities, normal strength in the  upper extremities.  She had normal muscle tone, bulk and coordination.  She  does have an antalgic gait.  Proprioception and light touch are intact.  Pedal pulses appeared less than 2 bilaterally.  HEENT:  Pupils equal round and reactive to light.  Full extraocular  movements.  Tongue  and uvula normal, shoulder shrug is normal.  Hearing  intact to voice bilaterally.  LUNGS:  Lung fields clear.  HEART:  Regular rhythm, rate, no murmurs, rubs.  ABDOMEN:  Soft, nontender.   LABORATORY DATA/STUDIES:  Preoperative chest x-ray showed no active disease.  Preoperative blood work showed all labs to be within normal range except for  white count which is elevated, but she had started taking the  methylprednisolone.   [name] will be admitted today for operative resection of the disk at  L5/S1.  Risks and benefits including bleeding, infection, apparent bladder  dysfunction needs referred to surgery.  Her current disk herniation was  discussed.  She understands and wishes to proceed.                                                   Coletta Memos, M.D.    KC/MEDQ  D:  09/16/2002  T:  09/16/2002  Job:  [id number]

## 2011-02-17 ENCOUNTER — Telehealth: Payer: Self-pay | Admitting: Internal Medicine

## 2011-02-17 NOTE — Telephone Encounter (Signed)
LMTCB

## 2011-02-17 NOTE — Telephone Encounter (Signed)
SPOKE TO PT AND SHE STATES SHE HAS BEEN MORE SOB FOR ABOUT 7 DAYS SHE INCREASED HER PREDNISONE TO 30MG  FOR 3 DAYS AND IS NOW ON 20MG  DAILY, HER MAINT. DOSE IS 10MG  DAILY, PT WANTED TO KNOW WHAT TO DO SO I TOLD HER TO STAY IN OUT OF THE HEAT AND STAY ON THE 20MG  DAILY UNTIL WE SPOKE TO DR YOUNG FOR HIS ADVICE, PT AWARE WE WILL CALL HER BACK ONCE WE HEAR FROM DR YOUNG

## 2011-02-17 NOTE — Telephone Encounter (Signed)
Called and spoke with pt and informed her of CY's recs to increased prednisone to 40 to 60 mg daily until this breaks.  Pt verbalized her understanding and denied any questions.

## 2011-02-17 NOTE — Telephone Encounter (Signed)
Per CY-okay to take 40-60 mg prednisone daily until this breaks.

## 2011-03-06 ENCOUNTER — Other Ambulatory Visit: Payer: Self-pay | Admitting: *Deleted

## 2011-03-06 MED ORDER — PREDNISONE 10 MG PO TABS: ORAL_TABLET | ORAL | Status: DC

## 2011-04-20 ENCOUNTER — Telehealth: Payer: Self-pay | Admitting: Internal Medicine

## 2011-04-20 NOTE — Telephone Encounter (Signed)
I spoke with patient; aware of recs from CY-states she doesn't want to go to ER and wait all night; I have made appt for patient at 9am tomorrow with CY; Pt verbalized her understanding that if she gets worse through the night she it to GO TO THE ER.

## 2011-04-20 NOTE — Telephone Encounter (Signed)
Called and spoke with pt--she stated that she has increased SOB x 2 days.  Wheezing as well.   Has been on abx x 1 month from her primary care doctor.  She has another refill of the abx but she does not want to take another month of abx.  She stated that she is not much better after the abx--azithromyacin 500mg   Once daily.  He also gave her a pred taper. Pt is now on pred 10mg  daily now.  Cy please advise.

## 2011-04-20 NOTE — Telephone Encounter (Signed)
I can't tell if this is the right antibiotic. Usually she needs a lot more prednisone- on order of 40 mg/ day till she breaks. If she really thinks she might need antibiotic, at this time of day I would recommend ER or urgent care for CXR and reassessment.

## 2011-04-21 ENCOUNTER — Ambulatory Visit (INDEPENDENT_AMBULATORY_CARE_PROVIDER_SITE_OTHER)
Admission: RE | Admit: 2011-04-21 | Discharge: 2011-04-21 | Disposition: A | Discharge status: Home / Self Care | Payer: Medicare Other | Source: Ambulatory Visit | Attending: Internal Medicine | Admitting: Internal Medicine

## 2011-04-21 ENCOUNTER — Ambulatory Visit (INDEPENDENT_AMBULATORY_CARE_PROVIDER_SITE_OTHER): Payer: Medicare Other | Admitting: Internal Medicine

## 2011-04-21 ENCOUNTER — Encounter: Payer: Self-pay | Admitting: Internal Medicine

## 2011-04-21 VITALS — BP 130/70 | HR 95 | Ht 63.0 in | Wt 141.2 lb

## 2011-04-21 DIAGNOSIS — J441 Chronic obstructive pulmonary disease with (acute) exacerbation: Secondary | ICD-10-CM

## 2011-04-21 MED ORDER — PREDNISONE 20 MG PO TABS: ORAL_TABLET | ORAL | Status: DC

## 2011-04-21 NOTE — Assessment & Plan Note (Addendum)
This may be mostly bronchospasm. I don't see role now for antibiotic, but will want to see CXR. We will load prednisone and continue home meds. Continue to watch for the significance of her aortic stenosis.

## 2011-04-21 NOTE — Progress Notes (Signed)
Subjective:    Patient ID: [name], female    DOB: 12-10-1932, 77 y.o.   MRN: [id number]  HPI    Review of Systems     Objective:   Physical Exam        Assessment & Plan:   No problem-specific assessment & plan notes found for this encounter.  Subjective:    Patient ID: [name], female    DOB: [birth year], 77 y.o.   MRN: [id number]  HPI 33 yoF former smoker with COPD/ chronic obstructive asthma, marked labile component with recurrent acute bronchitis complicated by  Paroxysmal atrial fib and hx Aortic Stenosis. After bad 2-3 months, she cleared completely with 30 days of azithromycin given February 6. Dr Eden Emms has seen her and felt her murmur is stable. In spite of pollen season she feels very well. Sleeps with O2 every night and uses it as needed in daytime.   04/21/11- 77  yoF former smoker with COPD/ chronic obstructive asthma, marked labile component with recurrent acute bronchitiscomplicated by  Paroxysmal atrial fib and hx Aortic Stenosis. CXR pending today She called last night with exacerbation and comes in this morning. She called her PCP 6 weeks ago with increased wheeze. Was given azithromycin x 1 month, prednisone taper then 10 mg daily. Could tend garden in heat, but remained tight in chest . In last 2 days much tighter, "full" in chest. Denies fever, sore throat, green, chest pain or swelling.  Arthritis bothering her, stressed by husband who has had another CVA, caring for 7 pets, doing a lot of canning- feels "so tired". Continues O2 2 L/M for sleep and prn. Review of Systems See HPI Constitutional:   No weight loss, night sweats,  Fevers, chills,                   + fatigue, lassitude. HEENT:   No headaches,  Difficulty swallowing,  Tooth/dental problems,  Sore throat,                No sneezing, itching, ear ache, nasal congestion, post nasal drip,   CV:  No chest pain,  Orthopnea, PND, swelling in lower extremities, anasarca, dizziness,  palpitations  GI  No heartburn, indigestion, abdominal pain, nausea, vomiting, diarrhea, change in bowel habits, loss of appetite    Resp:-No excess mucus,  + mild cough, scant sputum,  No coughing up of blood.  No change in color of mucus.  + wheezing.  Skin: no rash or lesions.  GU: no dysuria, change in color of urine, no urgency or frequency.  No flank pain.  MS:  + arthritis pains  Psych:  No change in mood or affect. No depression or anxiety.  No memory loss.      Objective:   Physical Exam General- Alert, Oriented, Affect-appropriate, Distress- not severe Skin- rash-none, lesions- none, excoriation- none Lymphadenopathy- none Head- atraumatic            Eyes- Gross vision intact, PERRLA, conjunctivae clear secretions            Ears- Hearing, canals normal            Nose- Clear, No-Septal dev, mucus, polyps, erosion, perforation             Throat- Mallampati II , mucosa clear , drainage- none, tonsils- atrophic Neck- flexible , trachea midline, no stridor , thyroid nl, carotid no bruit Chest - symmetrical excursion , unlabored  Heart/CV- RRR , no murmur , no gallop  , no rub, nl s1 s2                           + JVD- 2 cm ,            edema- none, stasis changes- none, varices- none           Lung- +clear to P&A but very distant, wheeze- none, cough- none , dullness-none, rub- none           Chest wall-  Abd- tender-no, distended-no, bowel sounds-present, HSM- no Br/ Gen/ Rectal- Not done, not indicated Extrem- cyanosis- none, clubbing, none, atrophy- none, strength- nl     +teoarthritis changes in hands Neuro- grossly intact to observation          Assessment & Plan:

## 2011-04-21 NOTE — Patient Instructions (Signed)
Prednisone script sent  Order - CXR  Dx bronchitis

## 2011-04-22 ENCOUNTER — Telehealth: Payer: Self-pay | Admitting: Internal Medicine

## 2011-04-22 NOTE — Telephone Encounter (Signed)
Called spoke with patient, advised of cxr results as stated by CDY.  Pt verbalized her understanding.  Pt reports that she still does not feel any better but only began the pred taper this morning.  Advised pt to give it a few more days especially with the high dose of prednisone just starting today and if her symptoms don't improve or worsen to call back.  Pt okay with this and verbalized her understanding.

## 2011-04-29 ENCOUNTER — Telehealth: Payer: Self-pay | Admitting: Internal Medicine

## 2011-04-29 NOTE — Telephone Encounter (Signed)
Pt states she is still having increased SOB, tightness in chest, and getting concerned. Appt made for Thursday 04-30-11 at 930am.

## 2011-04-29 NOTE — Telephone Encounter (Signed)
Pt requests call back asap re: tight chest - recs. [facility]. Pt says she had same problem last wk but has been having difficulty getting answers from a nurse. [name]

## 2011-04-29 NOTE — Telephone Encounter (Signed)
lmomtcb x1 

## 2011-04-30 ENCOUNTER — Encounter: Payer: Self-pay | Admitting: Internal Medicine

## 2011-04-30 ENCOUNTER — Ambulatory Visit (INDEPENDENT_AMBULATORY_CARE_PROVIDER_SITE_OTHER): Payer: Medicare Other | Admitting: Internal Medicine

## 2011-04-30 VITALS — BP 136/88 | HR 95 | Ht 63.0 in | Wt 140.4 lb

## 2011-04-30 DIAGNOSIS — J441 Chronic obstructive pulmonary disease with (acute) exacerbation: Secondary | ICD-10-CM

## 2011-04-30 MED ORDER — AZITHROMYCIN 500 MG PO TABS: ORAL_TABLET | ORAL | Status: DC

## 2011-04-30 MED ORDER — METHYLPREDNISOLONE ACETATE 80 MG/ML IJ SUSP
80.0000 mg | Freq: Once | INTRAMUSCULAR | Status: AC
  Administered 2011-04-30: 80 mg via INTRAMUSCULAR

## 2011-04-30 MED ORDER — MAGNESIUM OXIDE 400 (241.3 MG) MG PO TABS: ORAL_TABLET | ORAL | Status: DC

## 2011-04-30 NOTE — Progress Notes (Signed)
Subjective:    Patient ID: [name], female    DOB: [birth year], 77 y.o.   MRN: [id number]  HPI    Review of Systems     Objective:   Physical Exam        Assessment & Plan:   Subjective:    Patient ID: [name], female    DOB: Nov 02, 1932, 77 y.o.   MRN: [id number]  HPI    Review of Systems     Objective:   Physical Exam        Assessment & Plan:   No problem-specific assessment & plan notes found for this encounter.  Subjective:    Patient ID: [name], female    DOB: 1932-12-15, 77 y.o.   MRN: [id number]  HPI 25 yoF former smoker with COPD/ chronic obstructive asthma, marked labile component with recurrent acute bronchitis complicated by  Paroxysmal atrial fib and hx Aortic Stenosis. After bad 2-3 months, she cleared completely with 30 days of azithromycin given February 6. Dr Eden Emms has seen her and felt her murmur is stable. In spite of pollen season she feels very well. Sleeps with O2 every night and uses it as needed in daytime.   04/21/11- 77  yoF former smoker with COPD/ chronic obstructive asthma, marked labile component with recurrent acute bronchitis complicated by  Paroxysmal atrial fib and hx Aortic Stenosis. CXR pending today She called last night with exacerbation and comes in this morning. She called her PCP 6 weeks ago with increased wheeze. Was given azithromycin x 1 month, prednisone taper then 10 mg daily. Could tend garden in heat, but remained tight in chest . In last 2 days much tighter, "full" in chest. Denies fever, sore throat, green, chest pain or swelling.  Arthritis bothering her, stressed by husband who has had another CVA, caring for 7 pets, doing a lot of canning- feels "so tired". Continues O2 2 L/M for sleep and prn.  04/30/11- 04/21/11- 77  yoF former smoker with COPD/ chronic obstructive asthma, marked labile component with recurrent acute bronchitis complicated by  Paroxysmal atrial fib and hx Aortic Stenosis Doesn't  feel toxic or infected, but wheeze isn't breaking. Little phlegm. She tapered from 60 to 20 mg prednisone daily. We discussed her improvement during the winter with a prolonged course of azithromycin. We discussed the anti-inflammatory effect attributed to macrolides; also the tocolytic effect of magnesium.   Review of Systems Constitutional:   No weight loss, night sweats,  Fevers, chills,                   + fatigue, lassitude. HEENT:   No headaches,  Difficulty swallowing,  Tooth/dental problems,  Sore throat,                No sneezing, itching, ear ache, nasal congestion, post nasal drip,   CV:  No chest pain,  Orthopnea, PND, swelling in lower extremities, anasarca, dizziness, palpitations  GI  No heartburn, indigestion, abdominal pain, nausea, vomiting, diarrhea, change in bowel habits, loss of appetite    Resp:-No excess mucus,  + mild cough, scant sputum,  No coughing up of blood.  No change in color of mucus.  + wheezing.  Skin: no rash or lesions.  GU: no dysuria, change in color of urine, no urgency or frequency.  No flank pain.  MS:  + arthritis pains  Psych:  No change in mood or affect. No depression or anxiety.  No memory loss.  Objective:   Physical Exam General- Alert, Oriented, Affect-appropriate, Distress- not severe Skin- rash-none, lesions- none, excoriation- none Lymphadenopathy- none Head- atraumatic            Eyes- Gross vision intact, PERRLA, conjunctivae clear secretions            Ears- Hearing, canals normal            Nose- Clear, No-Septal dev, mucus, polyps, erosion, perforation             Throat- Mallampati II , mucosa clear , drainage- none, tonsils- atrophic Neck- flexible , trachea midline, no stridor , thyroid nl, carotid no bruit Chest - symmetrical excursion , unlabored           Heart/CV- RRR , no murmur , no gallop  , no rub, nl s1 s2                           + JVD- 1 cm ,            edema- none, stasis changes- none, varices- none            Lung- expiratory wheeze, cough- none , dullness-none, rub- none. Able to speak in full sentences.            Chest wall-  Abd- tender-no, distended-no, bowel sounds-present, HSM- no Br/ Gen/ Rectal- Not done, not indicated Extrem- cyanosis- none, clubbing, none, atrophy- none, strength- nl     +osteoarthritis changes in hands Neuro- grossly intact to observation          Assessment & Plan:

## 2011-04-30 NOTE — Progress Notes (Signed)
Addended by: Modesto Charon on: 04/30/2011 10:24 AM   Modules accepted: Orders

## 2011-04-30 NOTE — Patient Instructions (Signed)
Scripts sent for magnesium and azithromycin  Depo 80

## 2011-04-30 NOTE — Assessment & Plan Note (Addendum)
We are admittedly stretching now, but will try magnesium and azithromycin for anti-inflammatory and bronchodilator effects.

## 2011-05-14 ENCOUNTER — Telehealth: Payer: Self-pay | Admitting: Internal Medicine

## 2011-05-14 NOTE — Telephone Encounter (Signed)
Error.[name] ° °

## 2011-05-18 ENCOUNTER — Encounter: Payer: Self-pay | Admitting: Internal Medicine

## 2011-05-18 ENCOUNTER — Ambulatory Visit (INDEPENDENT_AMBULATORY_CARE_PROVIDER_SITE_OTHER): Payer: Medicare Other | Admitting: Internal Medicine

## 2011-05-18 VITALS — BP 130/80 | HR 90 | Ht 63.0 in | Wt 140.2 lb

## 2011-05-18 DIAGNOSIS — J449 Chronic obstructive pulmonary disease, unspecified: Secondary | ICD-10-CM

## 2011-05-18 NOTE — Assessment & Plan Note (Signed)
We are going to stay on prednisone Use up and stop the magnesium oxide Use up and stop the maintenance zithromax Ok to restart the Daliresp cautiously for another trial- her suggestion- after flu like symptoms last time.

## 2011-05-18 NOTE — Patient Instructions (Signed)
Use up and stop magnesium Use up and stop zithromax/ azithromycin  Ok to re-start Daliresp for trial - suggest you take one every other day for a week or so. If you tolerate it ok, you can try going back to one daily.

## 2011-05-18 NOTE — Progress Notes (Signed)
Subjective:    Patient ID: [name], female    DOB: [birth year], 77 y.o.   MRN: [id number]  HPI    Review of Systems     Objective:   Physical Exam        Assessment & Plan:   Subjective:    Patient ID: [name], female    DOB: Jul 03, 1933, 76 y.o.   MRN: [id number]  HPI    Review of Systems     Objective:   Physical Exam        Assessment & Plan:   Subjective:    Patient ID: [name], female    DOB: 08-Jul-1933, 77 y.o.   MRN: [id number]  HPI    Review of Systems     Objective:   Physical Exam        Assessment & Plan:   No problem-specific assessment & plan notes found for this encounter.  Subjective:    Patient ID: [name], female    DOB: April 26, 1933, 77 y.o.   MRN: [id number]  HPI 75 yoF former smoker with COPD/ chronic obstructive asthma, marked labile component with recurrent acute bronchitis complicated by  Paroxysmal atrial fib and hx Aortic Stenosis. After bad 2-3 months, she cleared completely with 30 days of azithromycin given February 6. Dr Eden Emms has seen her and felt her murmur is stable. In spite of pollen season she feels very well. Sleeps with O2 every night and uses it as needed in daytime.   04/21/11- 73  yoF former smoker with COPD/ chronic obstructive asthma, marked labile component with recurrent acute bronchitis complicated by  Paroxysmal atrial fib and hx Aortic Stenosis. CXR pending today She called last night with exacerbation and comes in this morning. She called her PCP 6 weeks ago with increased wheeze. Was given azithromycin x 1 month, prednisone taper then 10 mg daily. Could tend garden in heat, but remained tight in chest . In last 2 days much tighter, "full" in chest. Denies fever, sore throat, green, chest pain or swelling.  Arthritis bothering her, stressed by husband who has had another CVA, caring for 7 pets, doing a lot of canning- feels "so tired". Continues O2 2 L/M for sleep and prn.  04/30/11-  04/21/11- 78  yoF former smoker with COPD/ chronic obstructive asthma, marked labile component with recurrent acute bronchitis complicated by  Paroxysmal atrial fib and hx Aortic Stenosis Doesn't feel toxic or infected, but wheeze isn't breaking. Little phlegm. She tapered from 60 to 20 mg prednisone daily. We discussed her improvement during the winter with a prolonged course of azithromycin. We discussed the anti-inflammatory effect attributed to macrolides; also the tocolytic effect of magnesium.   05/18/11-  43  yoF former smoker with COPD/ chronic obstructive asthma, marked labile component with recurrent acute bronchitis complicated by  Paroxysmal atrial fib and hx Aortic Stenosis She reports feeling "some better" - able now to walk out to garden. No new pain or infection or acute problems.  She has been taking zithromax 1 daily maintenance, and she completed 20 days of manesium. After last burst, prednisone is back now to 10 mg daily.   Review of Systems Constitutional:   No weight loss, night sweats,  Fevers, chills,                   + fatigue, lassitude. HEENT:   No headaches,  Difficulty swallowing,  Tooth/dental problems,  Sore throat,  No sneezing, itching, ear ache, nasal congestion, post nasal drip,  CV:  No chest pain,  Orthopnea, PND, swelling in lower extremities, anasarca, dizziness, palpitations GI  No heartburn, indigestion, abdominal pain, nausea, vomiting, diarrhea, change in bowel habits, loss of appetite   Resp:-No excess mucus,  + mild cough, scant sputum,  No coughing up of blood.  No change in color of mucus.  + wheezing. Skin: no rash or lesions. GU: no dysuria, change in color of urine, no urgency or frequency.  No flank pain. MS:  + arthritis pains Psych:  No change in mood or affect. No depression or anxiety.  No memory loss.  Objective:   Physical Exam General- Alert, Oriented, Affect-appropriate, Distress- not severe;  Looks relaxed and easy  today Skin- rash-none, lesions- none, excoriation- none Lymphadenopathy- none Head- atraumatic            Eyes- Gross vision intact, PERRLA, conjunctivae clear secretions            Ears- Hearing, canals normal            Nose- Clear, No-Septal dev, mucus, polyps, erosion, perforation             Throat- Mallampati II , mucosa clear , drainage- none, tonsils- atrophic Neck- flexible , trachea midline, no stridor , thyroid nl, carotid no bruit Chest - symmetrical excursion , unlabored           Heart/CV- RRR , no murmur , no gallop  , no rub, nl s1 s2                            JVD- -none, edema- none, stasis changes- none, varices- none           Lung- expiratory wheeze, cough- none , dullness-none, rub- none. Able to speak in full sentences.            Chest wall-  Abd- tender-no, distended-no, bowel sounds-present, HSM- no Br/ Gen/ Rectal- Not done, not indicated Extrem- cyanosis- none, clubbing, none, atrophy- none, strength- nl     +osteoarthritis changes in hands Neuro- grossly intact to observation          Assessment & Plan:

## 2011-06-11 LAB — DIFFERENTIAL
Eosinophils Absolute: 0
Monocytes Absolute: 0.1
Neutro Abs: 6.1
Neutrophils Relative %: 89 — ABNORMAL HIGH

## 2011-06-11 LAB — URINALYSIS, ROUTINE W REFLEX MICROSCOPIC
Bilirubin Urine: NEGATIVE
Nitrite: NEGATIVE
Protein, ur: NEGATIVE
Urobilinogen, UA: 0.2

## 2011-06-11 LAB — COMPREHENSIVE METABOLIC PANEL
Albumin: 3.6
Alkaline Phosphatase: 75
BUN: 12
Chloride: 100
Glucose, Bld: 155 — ABNORMAL HIGH
Potassium: 4.1
Total Bilirubin: 1.1

## 2011-06-11 LAB — URINE CULTURE: Colony Count: 2000

## 2011-06-11 LAB — CBC
HCT: 40.5
Hemoglobin: 14
WBC: 6.9

## 2011-06-11 LAB — B-NATRIURETIC PEPTIDE (CONVERTED LAB): Pro B Natriuretic peptide (BNP): 118 — ABNORMAL HIGH

## 2011-06-30 LAB — CBC
HCT: 36.3
HCT: 40.8
Hemoglobin: 13.8
MCHC: 33.8
MCV: 88.2
Platelets: 261
Platelets: 289
RDW: 13.8
WBC: 12.4 — ABNORMAL HIGH

## 2011-06-30 LAB — CARDIAC PANEL(CRET KIN+CKTOT+MB+TROPI)
CK, MB: 2.5
CK, MB: 3
Total CK: 52
Troponin I: 0.02

## 2011-06-30 LAB — COMPREHENSIVE METABOLIC PANEL
Albumin: 3.7
Alkaline Phosphatase: 71
BUN: 18
CO2: 29
Chloride: 101
GFR calc non Af Amer: >60
Glucose, Bld: 155 — ABNORMAL HIGH
Potassium: 4.4
Total Bilirubin: 0.8

## 2011-06-30 LAB — LIPID PANEL
Cholesterol: 195
Triglycerides: 143

## 2011-06-30 LAB — PROTIME-INR: Prothrombin Time: 13

## 2011-06-30 LAB — TSH: TSH: 0.82

## 2011-06-30 LAB — MAGNESIUM: Magnesium: 2.1

## 2011-08-10 ENCOUNTER — Other Ambulatory Visit: Payer: Self-pay

## 2011-08-10 MED ORDER — ALPRAZOLAM 0.25 MG PO TABS: 0.2500 mg | ORAL_TABLET | Freq: Every evening | ORAL | Status: DC | PRN

## 2011-08-10 NOTE — Telephone Encounter (Signed)
Ok to refill alprazolam as requested, # 30, ref x 5

## 2011-08-11 ENCOUNTER — Telehealth: Payer: Self-pay | Admitting: Internal Medicine

## 2011-08-11 NOTE — Telephone Encounter (Signed)
I spoke with pt and she states [facility] closed down yesterday and needed her alprazolam rx sent to [facility] in [place]. I advised pt will call in rx since this was done yesterday but was sent to [facility]. Nothing further was needed

## 2011-08-17 ENCOUNTER — Ambulatory Visit (INDEPENDENT_AMBULATORY_CARE_PROVIDER_SITE_OTHER): Payer: Medicare Other | Admitting: Internal Medicine

## 2011-08-17 ENCOUNTER — Encounter: Payer: Self-pay | Admitting: Internal Medicine

## 2011-08-17 VITALS — BP 144/82 | HR 106 | Ht 63.0 in | Wt 138.6 lb

## 2011-08-17 DIAGNOSIS — Z23 Encounter for immunization: Secondary | ICD-10-CM

## 2011-08-17 DIAGNOSIS — J441 Chronic obstructive pulmonary disease with (acute) exacerbation: Secondary | ICD-10-CM

## 2011-08-17 NOTE — Progress Notes (Signed)
Patient ID: [name], female    DOB: 1933-09-03, 77 y.o.   MRN: [id number]  HPI 76 yoF former smoker with COPD/ chronic obstructive asthma, marked labile component with recurrent acute bronchitis complicated by  Paroxysmal atrial fib and hx Aortic Stenosis. After bad 2-3 months, she cleared completely with 30 days of azithromycin given February 6. Dr Eden Emms has seen her and felt her murmur is stable. In spite of pollen season she feels very well. Sleeps with O2 every night and uses it as needed in daytime.   04/21/11- 71  yoF former smoker with COPD/ chronic obstructive asthma, marked labile component with recurrent acute bronchitis complicated by  Paroxysmal atrial fib and hx Aortic Stenosis. CXR pending today She called last night with exacerbation and comes in this morning. She called her PCP 6 weeks ago with increased wheeze. Was given azithromycin x 1 month, prednisone taper then 10 mg daily. Could tend garden in heat, but remained tight in chest . In last 2 days much tighter, "full" in chest. Denies fever, sore throat, green, chest pain or swelling.  Arthritis bothering her, stressed by husband who has had another CVA, caring for 7 pets, doing a lot of canning- feels "so tired". Continues O2 2 L/M for sleep and prn.  04/30/11- 04/21/11- 78  yoF former smoker with COPD/ chronic obstructive asthma, marked labile component with recurrent acute bronchitis complicated by  Paroxysmal atrial fib and hx Aortic Stenosis Doesn't feel toxic or infected, but wheeze isn't breaking. Little phlegm. She tapered from 60 to 20 mg prednisone daily. We discussed her improvement during the winter with a prolonged course of azithromycin. We discussed the anti-inflammatory effect attributed to macrolides; also the tocolytic effect of magnesium.   05/18/11-  88  yoF former smoker with COPD/ chronic obstructive asthma, marked labile component with recurrent acute bronchitis complicated by  Paroxysmal atrial fib and hx  Aortic Stenosis She reports feeling "some better" - able now to walk out to garden. No new pain or infection or acute problems.  She has been taking zithromax 1 daily maintenance, and she completed 20 days of manesium. After last burst, prednisone is back now to 10 mg daily.   08/17/11- 76  yoF former smoker with COPD/ chronic obstructive asthma, marked labile component with recurrent acute bronchitis complicated by  Paroxysmal atrial fib and hx Aortic Stenosis She says she had been doing very well. Her family shared a viral syndrome and she got it a week ago. Describes aching low-grade fever nasal congestion fatigue with some cough and wheeze. 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 Nasal swab that her primary physician was negative for influenza , but she has not had the flu shot. Her physician gave steroid shot, doxycycline pending today. She had tapered maintenance prednisone 5 mg every other day but increased to 10 mg daily a few days ago. Has also had left cataract surgery.  Review of Systems Constitutional:   No weight loss, night sweats,  +Fevers, chills,                   + fatigue, lassitude. HEENT:   No headaches,  Difficulty swallowing,  Tooth/dental problems,  Sore throat,                No sneezing, itching, ear ache, nasal congestion, post nasal drip,  CV:  No chest pain,  Orthopnea, PND, swelling in lower extremities, anasarca, dizziness, palpitations GI  No heartburn, indigestion, abdominal pain,  nausea, vomiting, diarrhea, change in bowel habits, loss of appetite   Resp:-No excess mucus,  + mild cough, scant sputum,  No coughing up of blood.  No change in color of mucus.  + wheezing. Skin: no rash or lesions. GU: no dysuria, change in color of urine, no urgency or frequency.  No flank pain. MS:  + arthritis pains Psych:  No change in mood or affect. No depression or anxiety.  No memory loss.  Objective:   Physical Exam General- Alert, Oriented,  Affect-appropriate, Distress- not visible, but a little fidgety. Skin- rash-none, lesions- none, excoriation- none Lymphadenopathy- none Head- atraumatic            Eyes- Gross vision intact, PERRLA, conjunctivae clear secretions            Ears- Hearing, canals normal            Nose- Clear, No-Septal dev, mucus, polyps, erosion, perforation             Throat- Mallampati II , mucosa clear , drainage- none, tonsils- atrophic Neck- flexible , trachea midline, no stridor , thyroid nl, carotid no bruit Chest - symmetrical excursion , unlabored           Heart/CV- IRR , no murmur , no gallop  , no rub, nl s1 s2                            JVD- -none, edema- none, stasis changes- none, varices- none           Lung- etrace wheeze, cough- none , dullness-none, rub- none. Able to speak in full sentences.            Chest wall-  Abd- tender-no, distended-no, bowel sounds-present, HSM- no Br/ Gen/ Rectal- Not done, not indicated Extrem- cyanosis- none, clubbing, none, atrophy- none, strength- nl     +osteoarthritis changes in hands Neuro- grossly intact to observation

## 2011-08-17 NOTE — Patient Instructions (Addendum)
Flu vax  Please try to get a little more quiet time and rest, so you can catch up with all that has been going on.

## 2011-08-18 NOTE — Assessment & Plan Note (Signed)
Recent viral exacerbation of severe COPD. Atrial fibrillation complicates this, but the biggest problem is that she is trying to do too much. We discussed supportive measures including fluids and rest. Flu vaccine

## 2011-09-01 ENCOUNTER — Telehealth: Payer: Self-pay | Admitting: Internal Medicine

## 2011-09-01 MED ORDER — OSELTAMIVIR PHOSPHATE 75 MG PO CAPS: 75.0000 mg | ORAL_CAPSULE | Freq: Two times a day (BID) | ORAL | Status: DC

## 2011-09-01 NOTE — Telephone Encounter (Signed)
tamiflu sent to pharmacy and pt aware.

## 2011-09-01 NOTE — Telephone Encounter (Signed)
Per CY-give Tamiflu 75 mg #10 take 1 po bid no refills.  

## 2011-09-01 NOTE — Telephone Encounter (Signed)
Spoke with pt. She is c/o fatigue, increased SOB, wheezing and aches "all over" x 4 days- progressively worse every day since sx started. She has had no fever or CP but states chest is "heavy". Would like recs from [facility]. Please advise, thanks! Allergies  Allergen Reactions  . Daliresp (Roflumilast)   . Diltiazem   . Montelukast Sodium     REACTION: flu-like symptoms  . Zafirlukast

## 2011-09-07 ENCOUNTER — Telehealth: Payer: Self-pay | Admitting: Internal Medicine

## 2011-09-07 MED ORDER — MOXIFLOXACIN HCL 400 MG PO TABS: 400.0000 mg | ORAL_TABLET | Freq: Every day | ORAL | Status: DC

## 2011-09-07 NOTE — Telephone Encounter (Signed)
I spoke with pt and she c/o cough w/ green to yellow phlem, wheezing, chest tightness, increase SOB, aches "all over". Pt states she has been like this x 1 month. Pt c/o coughing up green to yellow phlem 4-5 times an hour. Pt was given tamiflu rx but states it did not help. Pt also taking hydromet cough syrup and mucinex BID. Pt is requesting further recs. Please advise Dr. Maple Hudson, thanks  Allergies  Allergen Reactions  . Daliresp (Roflumilast)   . Diltiazem   . Montelukast Sodium     REACTION: flu-like symptoms  . Zafirlukast

## 2011-09-07 NOTE — Telephone Encounter (Signed)
Per CY-okay to give Avelox 400 mg #10 take 1 po qd. No refills. Pt is aware that Rx is sent to [facility].

## 2011-09-08 ENCOUNTER — Emergency Department (HOSPITAL_COMMUNITY): Payer: Medicare Other

## 2011-09-08 ENCOUNTER — Encounter (HOSPITAL_COMMUNITY): Payer: Self-pay | Admitting: Emergency Medicine

## 2011-09-08 ENCOUNTER — Other Ambulatory Visit: Payer: Self-pay

## 2011-09-08 ENCOUNTER — Inpatient Hospital Stay (HOSPITAL_COMMUNITY)
Admission: EM | Admit: 2011-09-08 | Discharge: 2011-09-11 | DRG: 192 | Disposition: A | Discharge status: Home / Self Care | Payer: Medicare Other | Attending: Internal Medicine | Admitting: Internal Medicine

## 2011-09-08 DIAGNOSIS — J441 Chronic obstructive pulmonary disease with (acute) exacerbation: Principal | ICD-10-CM | POA: Diagnosis present

## 2011-09-08 DIAGNOSIS — I4891 Unspecified atrial fibrillation: Secondary | ICD-10-CM | POA: Diagnosis present

## 2011-09-08 DIAGNOSIS — I350 Nonrheumatic aortic (valve) stenosis: Secondary | ICD-10-CM | POA: Diagnosis present

## 2011-09-08 DIAGNOSIS — I480 Paroxysmal atrial fibrillation: Secondary | ICD-10-CM | POA: Diagnosis present

## 2011-09-08 DIAGNOSIS — I359 Nonrheumatic aortic valve disorder, unspecified: Secondary | ICD-10-CM | POA: Diagnosis present

## 2011-09-08 DIAGNOSIS — E785 Hyperlipidemia, unspecified: Secondary | ICD-10-CM | POA: Diagnosis present

## 2011-09-08 DIAGNOSIS — F411 Generalized anxiety disorder: Secondary | ICD-10-CM | POA: Diagnosis present

## 2011-09-08 DIAGNOSIS — I10 Essential (primary) hypertension: Secondary | ICD-10-CM | POA: Diagnosis present

## 2011-09-08 DIAGNOSIS — F419 Anxiety disorder, unspecified: Secondary | ICD-10-CM | POA: Diagnosis present

## 2011-09-08 LAB — COMPREHENSIVE METABOLIC PANEL
Albumin: 3.1 g/dL — ABNORMAL LOW (ref 3.5–5.2)
BUN: 12 mg/dL (ref 6–23)
Creatinine, Ser: 0.68 mg/dL (ref 0.50–1.10)
GFR calc Af Amer: >90 mL/min (ref >90)
Glucose, Bld: 130 mg/dL — ABNORMAL HIGH (ref 70–99)
Total Bilirubin: 0.8 mg/dL (ref 0.3–1.2)
Total Protein: 6 g/dL (ref 6.0–8.3)

## 2011-09-08 LAB — URINALYSIS, ROUTINE W REFLEX MICROSCOPIC
Leukocytes, UA: NEGATIVE
Nitrite: NEGATIVE
Specific Gravity, Urine: 1.019 (ref 1.005–1.030)
Urobilinogen, UA: 1 mg/dL (ref 0.0–1.0)
pH: 7 (ref 5.0–8.0)

## 2011-09-08 LAB — CBC
HCT: 40.7 % (ref 36.0–46.0)
Hemoglobin: 13.8 g/dL (ref 12.0–15.0)
MCH: 30.5 pg (ref 26.0–34.0)
MCHC: 33.9 g/dL (ref 30.0–36.0)
MCV: 89.8 fL (ref 78.0–100.0)

## 2011-09-08 LAB — URINE MICROSCOPIC-ADD ON

## 2011-09-08 LAB — DIFFERENTIAL
Basophils Relative: 0 % (ref 0–1)
Eosinophils Absolute: 0.1 10*3/uL (ref 0.0–0.7)
Monocytes Absolute: 1.7 10*3/uL — ABNORMAL HIGH (ref 0.1–1.0)
Monocytes Relative: 12 % (ref 3–12)

## 2011-09-08 LAB — CK: Total CK: 81 U/L (ref 7–177)

## 2011-09-08 MED ORDER — ALBUTEROL SULFATE (5 MG/ML) 0.5% IN NEBU: 2.5000 mg | INHALATION_SOLUTION | Freq: Once | RESPIRATORY_TRACT | Status: DC

## 2011-09-08 MED ORDER — ALPRAZOLAM 0.25 MG PO TABS: 0.2500 mg | ORAL_TABLET | Freq: Every evening | ORAL | Status: DC | PRN

## 2011-09-08 MED ORDER — SODIUM CHLORIDE 0.9 % IV SOLN
Freq: Once | INTRAVENOUS | Status: AC
  Administered 2011-09-08: 12:00:00 via INTRAVENOUS

## 2011-09-08 MED ORDER — SIMVASTATIN 5 MG PO TABS
5.0000 mg | ORAL_TABLET | Freq: Every day | ORAL | Status: DC
  Administered 2011-09-08 – 2011-09-10 (×3): 5 mg via ORAL
  Filled 2011-09-08 (×4): qty 1

## 2011-09-08 MED ORDER — IPRATROPIUM BROMIDE 0.02 % IN SOLN
0.5000 mg | Freq: Four times a day (QID) | RESPIRATORY_TRACT | Status: DC
  Administered 2011-09-08 – 2011-09-10 (×8): 0.5 mg via RESPIRATORY_TRACT
  Filled 2011-09-08 (×8): qty 2.5

## 2011-09-08 MED ORDER — LEVALBUTEROL HCL 0.63 MG/3ML IN NEBU
0.6300 mg | INHALATION_SOLUTION | Freq: Four times a day (QID) | RESPIRATORY_TRACT | Status: DC
  Administered 2011-09-08 – 2011-09-10 (×8): 0.63 mg via RESPIRATORY_TRACT
  Filled 2011-09-08 (×15): qty 3

## 2011-09-08 MED ORDER — FLEET ENEMA 7-19 GM/118ML RE ENEM: 1.0000 | ENEMA | Freq: Once | RECTAL | Status: AC | PRN

## 2011-09-08 MED ORDER — METOPROLOL TARTRATE 25 MG PO TABS
25.0000 mg | ORAL_TABLET | Freq: Two times a day (BID) | ORAL | Status: DC
  Administered 2011-09-08 – 2011-09-11 (×5): 25 mg via ORAL
  Filled 2011-09-08 (×7): qty 1

## 2011-09-08 MED ORDER — SENNA 8.6 MG PO TABS
1.0000 | ORAL_TABLET | Freq: Two times a day (BID) | ORAL | Status: DC
  Administered 2011-09-10 (×2): 8.6 mg via ORAL
  Filled 2011-09-08 (×3): qty 1

## 2011-09-08 MED ORDER — ALUM & MAG HYDROXIDE-SIMETH 200-200-20 MG/5ML PO SUSP: 30.0000 mL | Freq: Four times a day (QID) | ORAL | Status: DC | PRN

## 2011-09-08 MED ORDER — FOLIC ACID 1 MG PO TABS
1.0000 mg | ORAL_TABLET | Freq: Every day | ORAL | Status: DC
  Administered 2011-09-08 – 2011-09-11 (×3): 1 mg via ORAL
  Filled 2011-09-08 (×4): qty 1

## 2011-09-08 MED ORDER — METHYLPREDNISOLONE SODIUM SUCC 125 MG IJ SOLR
60.0000 mg | Freq: Three times a day (TID) | INTRAMUSCULAR | Status: DC
  Administered 2011-09-08 – 2011-09-09 (×2): 60 mg via INTRAVENOUS
  Filled 2011-09-08 (×4): qty 2

## 2011-09-08 MED ORDER — ONDANSETRON HCL 4 MG PO TABS: 4.0000 mg | ORAL_TABLET | Freq: Four times a day (QID) | ORAL | Status: DC | PRN

## 2011-09-08 MED ORDER — ACETAMINOPHEN 650 MG RE SUPP: 650.0000 mg | Freq: Four times a day (QID) | RECTAL | Status: DC | PRN

## 2011-09-08 MED ORDER — HYDROCODONE-ACETAMINOPHEN 5-325 MG PO TABS: 1.0000 | ORAL_TABLET | ORAL | Status: DC | PRN

## 2011-09-08 MED ORDER — BISACODYL 10 MG RE SUPP: 10.0000 mg | Freq: Every day | RECTAL | Status: DC | PRN

## 2011-09-08 MED ORDER — LEVOFLOXACIN IN D5W 750 MG/150ML IV SOLN
750.0000 mg | INTRAVENOUS | Status: DC
Start: 2011-09-08 — End: 2011-09-10
  Administered 2011-09-08 – 2011-09-09 (×2): 750 mg via INTRAVENOUS
  Filled 2011-09-08 (×3): qty 150

## 2011-09-08 MED ORDER — ENOXAPARIN SODIUM 40 MG/0.4ML ~~LOC~~ SOLN
40.0000 mg | SUBCUTANEOUS | Status: DC
  Administered 2011-09-08 – 2011-09-10 (×3): 40 mg via SUBCUTANEOUS
  Filled 2011-09-08 (×5): qty 0.4

## 2011-09-08 MED ORDER — ALBUTEROL SULFATE (5 MG/ML) 0.5% IN NEBU: 2.5000 mg | INHALATION_SOLUTION | RESPIRATORY_TRACT | Status: DC | PRN

## 2011-09-08 MED ORDER — DM-GUAIFENESIN ER 30-600 MG PO TB12
2.0000 | ORAL_TABLET | Freq: Two times a day (BID) | ORAL | Status: DC
  Administered 2011-09-08 – 2011-09-11 (×6): 2 via ORAL
  Filled 2011-09-08 (×8): qty 2

## 2011-09-08 MED ORDER — ASPIRIN EC 81 MG PO TBEC
81.0000 mg | DELAYED_RELEASE_TABLET | Freq: Every day | ORAL | Status: DC
  Administered 2011-09-08 – 2011-09-11 (×3): 81 mg via ORAL
  Filled 2011-09-08 (×4): qty 1

## 2011-09-08 MED ORDER — IPRATROPIUM BROMIDE 0.02 % IN SOLN
0.5000 mg | Freq: Once | RESPIRATORY_TRACT | Status: AC
  Administered 2011-09-08: 0.5 mg via RESPIRATORY_TRACT
  Filled 2011-09-08: qty 2.5

## 2011-09-08 MED ORDER — POTASSIUM CHLORIDE CRYS ER 20 MEQ PO TBCR
40.0000 meq | EXTENDED_RELEASE_TABLET | Freq: Once | ORAL | Status: AC
  Administered 2011-09-08: 40 meq via ORAL
  Filled 2011-09-08: qty 2

## 2011-09-08 MED ORDER — ALBUTEROL SULFATE (5 MG/ML) 0.5% IN NEBU
2.5000 mg | INHALATION_SOLUTION | Freq: Once | RESPIRATORY_TRACT | Status: AC
  Administered 2011-09-08: 2.5 mg via RESPIRATORY_TRACT
  Filled 2011-09-08: qty 0.5

## 2011-09-08 MED ORDER — METHYLPREDNISOLONE SODIUM SUCC 125 MG IJ SOLR
125.0000 mg | Freq: Once | INTRAMUSCULAR | Status: AC
  Administered 2011-09-08: 125 mg via INTRAVENOUS
  Filled 2011-09-08: qty 2

## 2011-09-08 MED ORDER — ONDANSETRON HCL 4 MG/2ML IJ SOLN: 4.0000 mg | Freq: Four times a day (QID) | INTRAMUSCULAR | Status: DC | PRN

## 2011-09-08 MED ORDER — ONDANSETRON HCL 4 MG/2ML IJ SOLN: 4.0000 mg | Freq: Three times a day (TID) | INTRAMUSCULAR | Status: DC | PRN

## 2011-09-08 MED ORDER — SODIUM CHLORIDE 0.9 % IV SOLN
INTRAVENOUS | Status: AC
  Administered 2011-09-08: 21:00:00 via INTRAVENOUS

## 2011-09-08 MED ORDER — ACETAMINOPHEN 325 MG PO TABS: 650.0000 mg | ORAL_TABLET | Freq: Four times a day (QID) | ORAL | Status: DC | PRN

## 2011-09-08 MED ORDER — ONDANSETRON HCL 4 MG/2ML IJ SOLN
4.0000 mg | Freq: Once | INTRAMUSCULAR | Status: AC
  Administered 2011-09-08: 4 mg via INTRAVENOUS
  Filled 2011-09-08: qty 2

## 2011-09-08 NOTE — ED Notes (Signed)
Report given to rn ariela, to floor without distress

## 2011-09-08 NOTE — H&P (Signed)
PATIENT DETAILS Name: [name] Age: 77 y.o. Sex: female Date of Birth: 1933/06/15 Admit Date: 09/08/2011 HKV:[id number],GLOVFIEP EDWARD, MD   CHIEF COMPLAINT:  Shortness of breath and cough  HPI: Patient is a 77 year old Caucasian female who has a history of advanced COPD-on home O2, history of paroxysmal atrial fibrillation who comes in to [facility] emergency room for the above noted complaints. Apparently patient has had cough and generalized weakness for around a month now. Her shortness of breath has also been going on on and off for a month as well. Apparently this started around a month ago after she had rhinorrhea and cough. Her cough slowly worsened, now it is mostly productive with greenish phlegm. She saw her primary care doctor and was prescribed doxycycline, she had some mild improvement. However her symptoms then came back, this time the cough was much more, her shortness of breath slowly worsened as well. She then saw her primary pulmonologist will give her a 5 day course of Tamiflu. Patient claims that this did not make any difference. She continued to have the above-noted symptoms, she then called her primary pulmonologist again a few days ago and was given a prescription for Avelox. However her shortness of breath and cough or persistent and the patient was feeling much more weaker than usual, she therefore presented to [facility] emergency room for further evaluation. A chest x-ray done here does not show pneumonia, however the patient was found to have a possible flare of underlying COPD and the hospitalist service was then asked to admit this patient for further evaluation and treatment. Please note during my evaluation, patient did appear hypoxic on room air with oxygen saturations in the high 70s to 80s, however been applied with 2-4 L of oxygen her O2 saturation quickly improved to the mid 90s. Even when O2 saturation was 70s to 80s patient did not appear short of breath. She  has received steroids, and nebulized bronchodilators with some improvement in her breathing. She denies any chest pain to me. She denies any nausea vomiting or diarrhea. She denies any myalgias as well.   ALLERGIES:   Allergies  Allergen Reactions  . Daliresp (Roflumilast) Other (See Comments)    unknown  . Diltiazem Nausea Only  . Montelukast Sodium     REACTION: flu-like symptoms  . Zafirlukast Other (See Comments)    unknown    PAST MEDICAL HISTORY: Past Medical History  Diagnosis Date  . Dermatitis   . GERD (gastroesophageal reflux disease)   . Depression   . Aortic stenosis     mild by echo (2008)  . Paroxysmal atrial fibrillation 11/08  . Allergic rhinitis   . Rheumatoid arthritis   . Hypertension   . Bronchitis, chronic obstructive, with exacerbation   . FHx: colon cancer   . Stasis dermatitis   . Multiple sclerosis   . COPD (chronic obstructive pulmonary disease)   . Asthma   . Acute urinary tract infection     PAST SURGICAL HISTORY: Past Surgical History  Procedure Date  . Abdominal hysterectomy   . Breast cyst excision   . Lumbar disc surgery 2003    L5    MEDICATIONS AT HOME: Prior to Admission medications   Medication Sig Start Date End Date Taking? Authorizing Provider  albuterol (PROAIR HFA) 108 (90 BASE) MCG/ACT inhaler Inhale 2 puffs into the lungs every 6 (six) hours as needed. For shortness of breath   Yes Historical Provider, MD  ALPRAZolam Prudy Feeler) 0.25 MG tablet  Take 1 tablet (0.25 mg total) by mouth at bedtime as needed. 08/10/11  Yes Waymon Budge, MD  arformoterol (BROVANA) 15 MCG/2ML NEBU Take 15 mcg by nebulization 2 (two) times daily.     Yes Historical Provider, MD  Dextromethorphan-Guaifenesin 60-1200 MG per 12 hr tablet Take 1 tablet by mouth every 12 (twelve) hours.     Yes Historical Provider, MD  doxycycline (VIBRAMYCIN) 100 MG capsule Take 100 mg by mouth 2 (two) times daily.     Yes Historical Provider, MD  hydrochlorothiazide  25 MG tablet Take 25 mg by mouth daily.     Yes Historical Provider, MD  Magnesium Oxide 400 (241.3 MG) MG TABS 1 daily 04/30/11 04/29/12 Yes Clinton D Young, MD  moxifloxacin (AVELOX) 400 MG tablet Take 400 mg by mouth daily.   09/07/11 09/17/11 Yes Waymon Budge, MD  predniSONE (DELTASONE) 10 MG tablet 1 tab po qd 03/06/11  Yes Waymon Budge, MD  simvastatin (ZOCOR) 10 MG tablet Take 5 mg by mouth at bedtime.    Yes Historical Provider, MD    FAMILY HISTORY: Family History  Problem Relation Age of Onset  . Asthma Mother   . Emphysema Father   . Alzheimer's disease Brother   . Colon cancer Other     sibling  . Heart disease Other     sibling    SOCIAL HISTORY:  reports that she quit smoking about 21 years ago. She does not have any smokeless tobacco history on file. Her alcohol and drug histories not on file.  REVIEW OF SYSTEMS:  Constitutional:   No  weight loss, night sweats,  Fevers, chills, fatigue.  HEENT:    No headaches, Difficulty swallowing,Tooth/dental problems,Sore throat,  No sneezing, itching, ear ache, nasal congestion, post nasal drip,   Cardio-vascular: No chest pain,  Orthopnea, PND, swelling in lower extremities, anasarca,  dizziness, palpitations  GI:  No heartburn, indigestion, abdominal pain, nausea, vomiting, diarrhea, change in   bowel habits, loss of appetite  Resp:  No coughing up of blood.No change in color of mucus.No chest wall deformity  Skin:  no rash or lesions.  GU:  no dysuria, change in color of urine, no urgency or frequency.  No flank pain.  Musculoskeletal: No joint pain or swelling.  No decreased range of motion.  No back pain.  Psych: No change in mood or affect. No depression or anxiety.  No memory loss.   PHYSICAL EXAM: Blood pressure 100/62, pulse 132, temperature 100.5 F (38.1 C), temperature source Oral, resp. rate 26, height 5\' 3"  (1.6 m), weight 61.236 kg (135 lb), SpO2 98.00%.  General appearance :Awake, alert,  not in any distress. Speech Clear. Not toxic Looking HEENT: Atraumatic and Normocephalic, pupils equally reactive to light and accomodation Neck: supple, no JVD. No cervical lymphadenopathy.  Chest:Good air entry bilaterally, coarse rhonchi heard in all lung zones.  CVS: S1 S2 regular, no murmurs.  Abdomen: Bowel sounds present, Non tender and not distended with no gaurding, rigidity or rebound. Extremities: B/L Lower Ext shows no edema, both legs are warm to touch, with  dorsalis pedis pulses palpable. Neurology: Awake alert, and oriented X 3, CN II-XII intact, Non focal, Deep Tendon Reflex-2+ all over, plantar's downgoing B/L, sensory exam is grossly intact.  Skin:No Rash Wounds:N/A  LABS ON ADMISSION:   Basename 09/08/11 1117  NA 136  K 3.4*  CL 94*  CO2 35*  GLUCOSE 130*  BUN 12  CREATININE 0.68  CALCIUM 9.0  MG --  PHOS --    Basename 09/08/11 1117  AST 17  ALT 22  ALKPHOS 72  BILITOT 0.8  PROT 6.0  ALBUMIN 3.1*   No results found for this basename: LIPASE:2,AMYLASE:2 in the last 72 hours  Basename 09/08/11 1117  WBC 13.5*  NEUTROABS 10.8*  HGB 13.8  HCT 40.7  MCV 89.8  PLT 168    Basename 09/08/11 1117  CKTOTAL 81  CKMB --  CKMBINDEX --  TROPONINI --   No results found for this basename: DDIMER:2 in the last 72 hours No components found with this basename: POCBNP:3   RADIOLOGIC STUDIES ON ADMISSION: Dg Chest 2 View  09/08/2011  *RADIOLOGY REPORT*  Clinical Data: Cough, congestion, shortness of breath.  CHEST - 2 VIEW  Comparison: 04/21/2011  Findings: Peribronchial thickening. There is hyperinflation of the lungs compatible with COPD.  Heart is normal size.  No confluent opacities or effusions.  No acute bony abnormality.  IMPRESSION: COPD/bronchitic changes.  Original Report Authenticated By: Cyndie Chime, M.D.    ASSESSMENT AND PLAN: Present on Admission:  .BRONCHITIS, OBSTRUCTIVE CHRONIC, ACUTE EXACERBATION -Review of her old records shows  that she has had lingering and persistent symptoms of COPD exacerbation in the past.  -She will be admitted to the hospital, I will place her on IV Solu-Medrol, and provide her with nebulized Xopenex and Atrovent every 6 hours. She will also be empirically started on Levaquin. Incentive spirometry and flutter valve will also be prescribed.  -We will follow her clinical course, if her symptoms are still persistent then we will consult PCCM. I will however order a CT scan of the chest without contrast to make sure that we are not dealing with pneumonia.  Marland KitchenPAF (paroxysmal atrial fibrillation) -Currently she appears tachycardic, however there are some PACs/PVCs -however her rhythm does appear to be sinus. She is at risk of having multi-focal atrial tachycardia as, I will place on telemetry and use Xopenex instead of albuterol.  -I will hold her hydrochlorothiazide and instead place on Lopressor and see how she does.  -A 12-lead EKG will need to be repeated in the next 24-48 hours if she is still persistently tachycardic to make sure that she does not have MAT -Also note she apparently has had allergic reactions with diltiazem.  .AORTIC STENOSIS -Further monitoring will be done as an outpatient.   Marland KitchenHYPERTENSION -As noted will stop her hydrochlorothiazide, will see how she does with Lopressor.   .Dyslipidemia -Continue with statin   .Anxiety -Continue with Xanax    Further plan will depend as patient's clinical course evolves and further radiologic and laboratory data become available. Patient will be monitored closely.   DVT Prophylaxis:  prophylactic subcutaneous Lovenox  Code Status:  full code  Total time spent for admission equals 45 minutes.  Jeoffrey Massed 09/08/2011, 3:48 PM

## 2011-09-08 NOTE — ED Notes (Signed)
Pt arrived ems from home with c/o fever, cough, congestion, flu-like symptoms x 3 weeks; hx copd

## 2011-09-08 NOTE — ED Notes (Signed)
Pt reports feeling sick x 3 weeks; saw pcp yesterday and placed on avelox; reports feeling worse this am with increased generalized weakness; nausea present; denies vomitting or diarrhea; reports productive cough x 3 weeks, describing as "big green globs"

## 2011-09-08 NOTE — ED Provider Notes (Signed)
History     CSN: [id number] Arrival date & time: 09/08/2011 10:14 AM   First MD Initiated Contact with Patient 09/08/11 1107      Chief Complaint  Patient presents with  . Fever  . Cough  . Nasal Congestion    (Consider location/radiation/quality/duration/timing/severity/associated sxs/prior treatment) Patient is a 77 y.o. female presenting with fever and cough. The history is provided by the patient.  Fever Primary symptoms of the febrile illness include fever and cough.  Cough  She has been sick for the last 3-4 weeks with subjective fever, cough productive of green sputum, generalized myalgias and malaise. She states that she has been feeling generally weak and has not had much appetite. There's been nausea, but no vomiting or diarrhea. There has been little change in her baseline dyspnea from COPD, and home albuterol nebulizer treatments to give her slight relief of her coughing. She has been treated with a course of Tamiflu and doxycycline with no improvement. She was started on Avelox yesterday. Symptoms are described as severe.  Past Medical History  Diagnosis Date  . Dermatitis   . GERD (gastroesophageal reflux disease)   . Depression   . Aortic stenosis     mild by echo (2008)  . Paroxysmal atrial fibrillation 11/08  . Allergic rhinitis   . Rheumatoid arthritis   . Hypertension   . Bronchitis, chronic obstructive, with exacerbation   . FHx: colon cancer   . Stasis dermatitis   . Multiple sclerosis   . COPD (chronic obstructive pulmonary disease)   . Asthma   . Acute urinary tract infection     Past Surgical History  Procedure Date  . Abdominal hysterectomy   . Breast cyst excision   . Lumbar disc surgery 2003    L5    Family History  Problem Relation Age of Onset  . Asthma Mother   . Emphysema Father   . Alzheimer's disease Brother   . Colon cancer Other     sibling  . Heart disease Other     sibling    History  Substance Use Topics  . Smoking  status: Former Smoker    Quit date: 11/25/1989  . Smokeless tobacco: Not on file  . Alcohol Use: Not on file    OB History    Grav Para Term Preterm Abortions TAB SAB Ect Mult Living                  Review of Systems  Constitutional: Positive for fever.  Respiratory: Positive for cough.   All other systems reviewed and are negative.    Allergies  Daliresp; Diltiazem; Montelukast sodium; and Zafirlukast  Home Medications   Current Outpatient Rx  Name Route Sig Dispense Refill  . ALBUTEROL SULFATE HFA 108 (90 BASE) MCG/ACT IN AERS Inhalation Inhale 2 puffs into the lungs every 6 (six) hours as needed. For shortness of breath    . ALPRAZOLAM 0.25 MG PO TABS Oral Take 1 tablet (0.25 mg total) by mouth at bedtime as needed. 30 tablet 0  . ARFORMOTEROL TARTRATE 15 MCG/2ML IN NEBU Nebulization Take 15 mcg by nebulization 2 (two) times daily.      Marland Kitchen DM-GUAIFENESIN ER 60-1200 MG PO TB12 Oral Take 1 tablet by mouth every 12 (twelve) hours.      Marland Kitchen DOXYCYCLINE HYCLATE 100 MG PO CAPS Oral Take 100 mg by mouth 2 (two) times daily.      Marland Kitchen HYDROCHLOROTHIAZIDE 25 MG PO TABS Oral Take 25 mg by  mouth daily.      Marland Kitchen MAGNESIUM OXIDE 400 (241.3 MG) MG PO TABS  1 daily 20 tablet 0  . MOXIFLOXACIN HCL 400 MG PO TABS Oral Take 400 mg by mouth daily.      Marland Kitchen PREDNISONE 10 MG PO TABS  1 tab po qd 30 tablet 6  . SIMVASTATIN 10 MG PO TABS Oral Take 5 mg by mouth at bedtime.       BP 120/101  Pulse 88  Temp(Src) 100.6 F (38.1 C) (Oral)  Resp 24  Ht 5\' 3"  (1.6 m)  Wt 135 lb (61.236 kg)  BMI 23.91 kg/m2  SpO2 98%  Physical Exam  Nursing note and vitals reviewed.  77 year old female who appears uncomfortable. Vital signs showed temperature of 100.6. She's tachypnea with respiratory rate of 24. Oxygen saturation is a satisfactory 98% with nasal oxygen. Head is normocephalic and atraumatic. Oropharynx is clear. Neck is supple without adenopathy or JVD. Lungs have diminished airflow with faint,  high-pitched wheezes diffusely. No rales or rhonchi are heard. Heart has regular rate and rhythm without murmur. Abdomen is soft, flat, nontender without masses or hepatosplenomegaly. Extremities have no cyanosis or edema. Skin is warm and moist without rash. Neurologic: Mental status is normal, cranial nerves are intact, there no focal motor or sensory deficits. Psychiatric: No abnormalities of mood or affect.  ED Course  Procedures (including critical care time)   Labs Reviewed  CBC  DIFFERENTIAL  COMPREHENSIVE METABOLIC PANEL  CK  URINALYSIS, ROUTINE W REFLEX MICROSCOPIC   No results found.  Results for orders placed during the hospital encounter of 09/08/11  CBC      Component Value Range   WBC 13.5 (*) 4.0 - 10.5 (K/uL)   RBC 4.53  3.87 - 5.11 (MIL/uL)   Hemoglobin 13.8  12.0 - 15.0 (g/dL)   HCT 16.1  09.6 - 04.5 (%)   MCV 89.8  78.0 - 100.0 (fL)   MCH 30.5  26.0 - 34.0 (pg)   MCHC 33.9  30.0 - 36.0 (g/dL)   RDW 40.9  81.1 - 91.4 (%)   Platelets 168  150 - 400 (K/uL)  DIFFERENTIAL      Component Value Range   Neutrophils Relative 80 (*) 43 - 77 (%)   Neutro Abs 10.8 (*) 1.7 - 7.7 (K/uL)   Lymphocytes Relative 7 (*) 12 - 46 (%)   Lymphs Abs 0.9  0.7 - 4.0 (K/uL)   Monocytes Relative 12  3 - 12 (%)   Monocytes Absolute 1.7 (*) 0.1 - 1.0 (K/uL)   Eosinophils Relative 1  0 - 5 (%)   Eosinophils Absolute 0.1  0.0 - 0.7 (K/uL)   Basophils Relative 0  0 - 1 (%)   Basophils Absolute 0.0  0.0 - 0.1 (K/uL)  COMPREHENSIVE METABOLIC PANEL      Component Value Range   Sodium 136  135 - 145 (mEq/L)   Potassium 3.4 (*) 3.5 - 5.1 (mEq/L)   Chloride 94 (*) 96 - 112 (mEq/L)   CO2 35 (*) 19 - 32 (mEq/L)   Glucose, Bld 130 (*) 70 - 99 (mg/dL)   BUN 12  6 - 23 (mg/dL)   Creatinine, Ser 7.82  0.50 - 1.10 (mg/dL)   Calcium 9.0  8.4 - 95.6 (mg/dL)   Total Protein 6.0  6.0 - 8.3 (g/dL)   Albumin 3.1 (*) 3.5 - 5.2 (g/dL)   AST 17  0 - 37 (U/L)   ALT 22  0 - 35 (  U/L)   Alkaline  Phosphatase 72  39 - 117 (U/L)   Total Bilirubin 0.8  0.3 - 1.2 (mg/dL)   GFR calc non Af Amer 83 (*) >90 (mL/min)   GFR calc Af Amer >90  >90 (mL/min)  CK      Component Value Range   Total CK 81  7 - 177 (U/L)  URINALYSIS, ROUTINE W REFLEX MICROSCOPIC      Component Value Range   Color, Urine YELLOW  YELLOW    APPearance CLEAR  CLEAR    Specific Gravity, Urine 1.019  1.005 - 1.030    pH 7.0  5.0 - 8.0    Glucose, UA NEGATIVE  NEGATIVE (mg/dL)   Hgb urine dipstick SMALL (*) NEGATIVE    Bilirubin Urine NEGATIVE  NEGATIVE    Ketones, ur TRACE (*) NEGATIVE (mg/dL)   Protein, ur NEGATIVE  NEGATIVE (mg/dL)   Urobilinogen, UA 1.0  0.0 - 1.0 (mg/dL)   Nitrite NEGATIVE  NEGATIVE    Leukocytes, UA NEGATIVE  NEGATIVE   URINE MICROSCOPIC-ADD ON      Component Value Range   Squamous Epithelial / LPF FEW (*) RARE    RBC / HPF 3-6  <3 (RBC/hpf)   Bacteria, UA FEW (*) RARE    Urine-Other MUCOUS PRESENT     Dg Chest 2 View  09/08/2011  *RADIOLOGY REPORT*  Clinical Data: Cough, congestion, shortness of breath.  CHEST - 2 VIEW  Comparison: 04/21/2011  Findings: Peribronchial thickening. There is hyperinflation of the lungs compatible with COPD.  Heart is normal size.  No confluent opacities or effusions.  No acute bony abnormality.  IMPRESSION: COPD/bronchitic changes.  Original Report Authenticated By: Cyndie Chime, M.D.    No diagnosis found.  She was given an albuterol with Atrovent nebulizer treatment with some subjective improvement, but there were still route residual wheezes. She's given a second albuterol nebulizer treatment with no additional change. Given the duration of her symptoms and her failure to respond to outpatient treatment, it was decided to admit her for more aggressive pulmonary toilet, beta-2 agonists and steroids and consideration for changing antibiotics. Case is discussed with Dr. Jerral Ralph who agrees to admit the patient and requests that I write temporary admitting  orders.  MDM  Respiratory tract infection in setting of COPD which is not responded to outpatient management with antibiotics and antivirals. I have reviewed the telephone to be medications between the patient and Dr. Maple Hudson detailing prescription being phoned in for Tamiflu and prescription being phoned in for Avelox.        Dione Booze, MD 09/08/11 [phone]

## 2011-09-08 NOTE — ED Notes (Signed)
ZOX:[id number]<AV> Expected date:09/08/11<BR> Expected time: 9:28 AM<BR> Means of arrival:Ambulance<BR> Comments:<BR> 76yo, fever

## 2011-09-09 DIAGNOSIS — I359 Nonrheumatic aortic valve disorder, unspecified: Secondary | ICD-10-CM

## 2011-09-09 LAB — CBC
MCH: 30.6 pg (ref 26.0–34.0)
MCHC: 33.8 g/dL (ref 30.0–36.0)
MCV: 90.5 fL (ref 78.0–100.0)
Platelets: 200 10*3/uL (ref 150–400)
RDW: 13.5 % (ref 11.5–15.5)

## 2011-09-09 LAB — BASIC METABOLIC PANEL
CO2: 31 mEq/L (ref 19–32)
Calcium: 9 mg/dL (ref 8.4–10.5)
Creatinine, Ser: 0.58 mg/dL (ref 0.50–1.10)
GFR calc Af Amer: >90 mL/min (ref >90)
GFR calc non Af Amer: 87 mL/min — ABNORMAL LOW (ref >90)

## 2011-09-09 LAB — INFLUENZA PANEL BY PCR (TYPE A & B)
Influenza A By PCR: NEGATIVE
Influenza B By PCR: NEGATIVE

## 2011-09-09 MED ORDER — METHYLPREDNISOLONE SODIUM SUCC 125 MG IJ SOLR
60.0000 mg | Freq: Two times a day (BID) | INTRAMUSCULAR | Status: DC
  Administered 2011-09-09 – 2011-09-10 (×2): 60 mg via INTRAVENOUS
  Filled 2011-09-09 (×3): qty 0.96

## 2011-09-09 MED ORDER — PANTOPRAZOLE SODIUM 40 MG PO TBEC
40.0000 mg | DELAYED_RELEASE_TABLET | Freq: Every day | ORAL | Status: DC
  Administered 2011-09-09 – 2011-09-11 (×3): 40 mg via ORAL
  Filled 2011-09-09 (×4): qty 1

## 2011-09-09 NOTE — Progress Notes (Signed)
Pt states that she thinks we do not have the correct medications for her.  She is worried that her husband gave his list, not hers.  She did not think any of the 1000 meds were her correct medications except for the steroids, which she took and she refused the rest.  I will call her pharmacy for a list to compare.

## 2011-09-09 NOTE — Progress Notes (Signed)
  Echocardiogram 2D Echocardiogram has been performed.  Frieda Arnall Lynn Javon Hupfer, RDCS 09/09/2011, 9:45 AM

## 2011-09-09 NOTE — Progress Notes (Signed)
[facility] in [facility] called and med list there compared to med list that we have on file.  They are the same except for the changes that were made by the admitting physician.  This was discussed with the pt with understanding verbalized.  All questions were answered.

## 2011-09-09 NOTE — Progress Notes (Signed)
[name] is a 77 y.o. female patient admitted with sob for almost a month, preceded by her husband having a viral like illness. She has taken doxycycline/tamiflu/some avelox but without much relief. Cough productive of greenish colred phlegm. Ct chest showed copd. Patient feels better with interventions since admission.   1. COPD exacerbation     Past Medical History  Diagnosis Date  . Dermatitis   . GERD (gastroesophageal reflux disease)   . Depression   . Aortic stenosis     mild by echo (2008)  . Paroxysmal atrial fibrillation 11/08  . Allergic rhinitis   . Rheumatoid arthritis   . Hypertension   . Bronchitis, chronic obstructive, with exacerbation   . FHx: colon cancer   . Stasis dermatitis   . Multiple sclerosis   . COPD (chronic obstructive pulmonary disease)   . Asthma   . Acute urinary tract infection    Current Facility-Administered Medications  Medication Dose Route Frequency Provider Last Rate Last Dose  . 0.9 %  sodium chloride infusion   Intravenous Continuous Shanker Ghimire 40 mL/hr at 09/08/11 2030    . acetaminophen (TYLENOL) tablet 650 mg  650 mg Oral Q6H PRN Shanker Ghimire       Or  . acetaminophen (TYLENOL) suppository 650 mg  650 mg Rectal Q6H PRN Shanker Ghimire      . albuterol (PROVENTIL) (5 MG/ML) 0.5% nebulizer solution 2.5 mg  2.5 mg Nebulization Once Dione Booze, MD   2.5 mg at 09/08/11 1328  . albuterol (PROVENTIL) (5 MG/ML) 0.5% nebulizer solution 2.5 mg  2.5 mg Nebulization Once Dione Booze, MD   2.5 mg at 09/08/11 1503  . albuterol (PROVENTIL) (5 MG/ML) 0.5% nebulizer solution 2.5 mg  2.5 mg Nebulization Q2H PRN Shanker Ghimire      . ALPRAZolam (XANAX) tablet 0.25 mg  0.25 mg Oral QHS PRN Shanker Ghimire      . alum & mag hydroxide-simeth (MAALOX/MYLANTA) 200-200-20 MG/5ML suspension 30 mL  30 mL Oral Q6H PRN Shanker Ghimire      . aspirin EC tablet 81 mg  81 mg Oral Daily Shanker Ghimire   81 mg at 09/08/11 2329  . bisacodyl (DULCOLAX)  suppository 10 mg  10 mg Rectal Daily PRN Shanker Ghimire      . dextromethorphan-guaiFENesin (MUCINEX DM) 30-600 MG per 12 hr tablet 2 tablet  2 tablet Oral Q12H Shanker Ghimire   2 tablet at 09/09/11 0619  . enoxaparin (LOVENOX) injection 40 mg  40 mg Subcutaneous Q24H Shanker Ghimire   40 mg at 09/08/11 2329  . folic acid (FOLVITE) tablet 1 mg  1 mg Oral Daily Shanker Ghimire   1 mg at 09/08/11 2030  . HYDROcodone-acetaminophen (NORCO) 5-325 MG per tablet 1-2 tablet  1-2 tablet Oral Q4H PRN Shanker Ghimire      . ipratropium (ATROVENT) nebulizer solution 0.5 mg  0.5 mg Nebulization Once Dione Booze, MD   0.5 mg at 09/08/11 1328  . ipratropium (ATROVENT) nebulizer solution 0.5 mg  0.5 mg Nebulization Q6H Shanker Ghimire   0.5 mg at 09/09/11 1011  . levalbuterol (XOPENEX) nebulizer solution 0.63 mg  0.63 mg Nebulization Q6H Shanker Ghimire   0.63 mg at 09/09/11 1011  . Levofloxacin (LEVAQUIN) IVPB 750 mg  750 mg Intravenous Q24H Shanker Ghimire   750 mg at 09/08/11 2040  . methylPREDNISolone sodium succinate (SOLU-MEDROL) 125 MG injection 125 mg  125 mg Intravenous Once Dione Booze, MD   125 mg at 09/08/11 1504  .  methylPREDNISolone sodium succinate (SOLU-MEDROL) 125 MG injection 60 mg  60 mg Intravenous TID Shanker Ghimire   60 mg at 09/09/11 0929  . metoprolol tartrate (LOPRESSOR) tablet 25 mg  25 mg Oral BID Shanker Ghimire   25 mg at 09/08/11 2329  . ondansetron (ZOFRAN) tablet 4 mg  4 mg Oral Q6H PRN Shanker Ghimire       Or  . ondansetron (ZOFRAN) injection 4 mg  4 mg Intravenous Q6H PRN Shanker Ghimire      . pantoprazole (PROTONIX) EC tablet 40 mg  40 mg Oral Q1200 Victoriya Pol      . potassium chloride SA (K-DUR,KLOR-CON) CR tablet 40 mEq  40 mEq Oral Once Shanker Ghimire   40 mEq at 09/08/11 1838  . senna (SENOKOT) tablet 8.6 mg  1 tablet Oral BID Shanker Ghimire      . simvastatin (ZOCOR) tablet 5 mg  5 mg Oral QHS Shanker Ghimire   5 mg at 09/08/11 2329  . sodium phosphate (FLEET)  7-19 GM/118ML enema 1 enema  1 enema Rectal Once PRN Shanker Ghimire      . DISCONTD: albuterol (PROVENTIL) (5 MG/ML) 0.5% nebulizer solution 2.5 mg  2.5 mg Nebulization Once Dione Booze, MD      . DISCONTD: albuterol (PROVENTIL) (5 MG/ML) 0.5% nebulizer solution 2.5 mg  2.5 mg Nebulization Q4H PRN Dione Booze, MD      . DISCONTD: ondansetron [facility]) injection 4 mg  4 mg Intravenous Q8H PRN Dione Booze, MD       Allergies  Allergen Reactions  . Daliresp (Roflumilast) Other (See Comments)    unknown  . Diltiazem Nausea Only  . Montelukast Sodium     REACTION: flu-like symptoms  . Zafirlukast Other (See Comments)    unknown   Active Problems:  HYPERTENSION  AORTIC STENOSIS  BRONCHITIS, OBSTRUCTIVE CHRONIC, ACUTE EXACERBATION  PAF (paroxysmal atrial fibrillation)  Dyslipidemia  Anxiety   Vital signs in last 24 hours: Temp:  [98.2 F (36.8 C)-100.5 F (38.1 C)] 98.5 F (36.9 C) (12/19 0554) Pulse Rate:  [80-121] 80  (12/19 0554) Resp:  [16-26] 16  (12/19 0554) BP: (89-131)/(54-74) 110/69 mmHg (12/19 0554) SpO2:  [94 %-99 %] 97 % (12/19 0900) Weight:  [61 kg (134 lb 7.7 oz)] 134 lb 7.7 oz (61 kg) (12/18 2204) Weight change:  Last BM Date: 09/08/11  Intake/Output from previous day:   Intake/Output this shift: Total I/O In: 360 [P.O.:360] Out: 400 [Urine:400]  Lab Results:  [facility] 09/09/11 0325 09/08/11 1117  WBC 12.9* 13.5*  HGB 13.2 13.8  HCT 39.1 40.7  PLT 200 168   BMET  Basename 09/09/11 0325 09/08/11 1117  NA 138 136  K 4.4 3.4*  CL 100 94*  CO2 31 35*  GLUCOSE 147* 130*  BUN 16 12  CREATININE 0.58 0.68  CALCIUM 9.0 9.0    Studies/Results: Dg Chest 2 View  09/08/2011  *RADIOLOGY REPORT*  Clinical Data: Cough, congestion, shortness of breath.  CHEST - 2 VIEW  Comparison: 04/21/2011  Findings: Peribronchial thickening. There is hyperinflation of the lungs compatible with COPD.  Heart is normal size.  No confluent opacities or effusions.  No acute  bony abnormality.  IMPRESSION: COPD/bronchitic changes.  Original Report Authenticated By: Cyndie Chime, M.D.   Ct Chest Wo Contrast  09/08/2011  *RADIOLOGY REPORT*  Clinical Data: Cough and weakness.  CT CHEST WITHOUT CONTRAST  Technique:  Multidetector CT imaging of the chest was performed following the standard protocol without IV contrast.  Comparison: Chest x-ray earlier today.  Findings: There is underlying COPD and emphysematous lung disease. Right suprahilar region shows thickened and mildly dilated upper lobe bronchi likely reflecting chronic changes and potentially early bronchiectasis.  No evidence of definite focal pneumonia.  No edema or effusions.  The heart size is normal.  Calcified plaque noted at the level of the distal left main coronary artery and possibly proximal LAD.  No pulmonary nodules or enlarged lymph nodes identified.  Diffuse spondylosis noted of the thoracic spine.  IMPRESSION: COPD and emphysema.  Thickened and mildly dilated right upper lobe bronchi. Calcified coronary plaque.  Original Report Authenticated By: Reola Calkins, M.D.    Medications: I have reviewed the patient's current medications.   Physical exam GENERAL- alert and well HEAD- normal atraumatic, no neck masses, normal thyroid, no jvd RESPIRATORY- appears well, vitals normal, no respiratory distress, acyanotic, normal RR, ear and throat exam is normal, neck free of mass or lymphadenopathy, chest clear, no wheezing, crepitations, rhonchi, normal symmetric air entry CVS- regular rate and rhythm, S1, S2 normal, no murmur, click, rub or gallop ABDOMEN- abdomen is soft without significant tenderness, masses, organomegaly or guarding NEURO- Grossly normal EXTREMITIES- extremities normal, atraumatic, no cyanosis or edema  Plan 1. BRONCHITIS, OBSTRUCTIVE CHRONIC, ACUTE EXACERBATION- clinically better, less cough, phlegm clearing up. Continue current mx. Slow steroid taper. 2. HYPERTENSION- bp low  normal. Monitor. 3. AORTIC STENOSIS/  PAF (paroxysmal atrial fibrillation)- on asa, stable 4. Dyslipidemia/ Anxiety- stable. 5. Dvt/gi prophylaxis    Torrance Stockley 09/09/2011 12:39 PM Pager: [phone].

## 2011-09-10 LAB — CBC
Hemoglobin: 12.6 g/dL (ref 12.0–15.0)
Platelets: 218 10*3/uL (ref 150–400)
RBC: 4.08 MIL/uL (ref 3.87–5.11)
WBC: 20 10*3/uL — ABNORMAL HIGH (ref 4.0–10.5)

## 2011-09-10 LAB — COMPREHENSIVE METABOLIC PANEL
ALT: 27 U/L (ref 0–35)
AST: 19 U/L (ref 0–37)
Alkaline Phosphatase: 70 U/L (ref 39–117)
CO2: 30 mEq/L (ref 19–32)
Chloride: 101 mEq/L (ref 96–112)
GFR calc non Af Amer: 85 mL/min — ABNORMAL LOW (ref >90)
Glucose, Bld: 176 mg/dL — ABNORMAL HIGH (ref 70–99)
Sodium: 137 mEq/L (ref 135–145)
Total Bilirubin: 0.2 mg/dL — ABNORMAL LOW (ref 0.3–1.2)

## 2011-09-10 MED ORDER — AMOXICILLIN-POT CLAVULANATE 875-125 MG PO TABS
1.0000 | ORAL_TABLET | Freq: Two times a day (BID) | ORAL | Status: DC
  Administered 2011-09-10 – 2011-09-11 (×2): 1 via ORAL
  Filled 2011-09-10 (×3): qty 1

## 2011-09-10 MED ORDER — PREDNISONE 20 MG PO TABS
40.0000 mg | ORAL_TABLET | Freq: Every day | ORAL | Status: DC
  Administered 2011-09-10 – 2011-09-11 (×2): 40 mg via ORAL
  Filled 2011-09-10 (×2): qty 2

## 2011-09-10 MED ORDER — VITAMINS A & D EX OINT
TOPICAL_OINTMENT | CUTANEOUS | Status: AC
  Administered 2011-09-10: 1 via TOPICAL
  Filled 2011-09-10: qty 5

## 2011-09-10 MED ORDER — LEVOFLOXACIN 500 MG PO TABS
500.0000 mg | ORAL_TABLET | Freq: Every day | ORAL | Status: DC
  Administered 2011-09-11: 500 mg via ORAL
  Filled 2011-09-10: qty 1

## 2011-09-10 MED ORDER — HYDROCHLOROTHIAZIDE 25 MG PO TABS
25.0000 mg | ORAL_TABLET | Freq: Every day | ORAL | Status: DC
  Administered 2011-09-10 – 2011-09-11 (×2): 25 mg via ORAL
  Filled 2011-09-10 (×2): qty 1

## 2011-09-10 MED ORDER — ZOLPIDEM TARTRATE 5 MG PO TABS: 5.0000 mg | ORAL_TABLET | Freq: Every evening | ORAL | Status: DC | PRN

## 2011-09-10 NOTE — Progress Notes (Signed)
Physical Therapy Evaluation Patient Details Name: EMALINE KARNES MRN: [id number] DOB: [birth year] Today's Date: 09/10/2011 Time: 4098-1191 Charge: EVII  Problem List:  Patient Active Problem List  Diagnoses  . COLD SORE  . DEPRESSION  . MULTIPLE SCLEROSIS  . HYPERTENSION  . AORTIC STENOSIS  . OTHER SPECIFIED CARDIAC DYSRHYTHMIAS  . STASIS DERMATITIS  . ALLERGIC RHINITIS  . BRONCHITIS, OBSTRUCTIVE CHRONIC, ACUTE EXACERBATION  . ASTHMA, CHRONIC OBSTRUCTIVE NOS  . COPD  . GERD  . DERMATITIS  . RHEUMATOID ARTHRITIS  . DYSPNEA  . PAF (paroxysmal atrial fibrillation)  . Dyslipidemia  . Anxiety    Past Medical History:  Past Medical History  Diagnosis Date  . Dermatitis   . GERD (gastroesophageal reflux disease)   . Depression   . Aortic stenosis     mild by echo (2008)  . Paroxysmal atrial fibrillation 11/08  . Allergic rhinitis   . Rheumatoid arthritis   . Hypertension   . Bronchitis, chronic obstructive, with exacerbation   . FHx: colon cancer   . Stasis dermatitis   . Multiple sclerosis   . COPD (chronic obstructive pulmonary disease)   . Asthma   . Acute urinary tract infection    Past Surgical History:  Past Surgical History  Procedure Date  . Abdominal hysterectomy   . Breast cyst excision   . Lumbar disc surgery 2003    L5    PT Assessment/Plan/Recommendation PT Assessment Clinical Impression Statement: Pt would benefit from acute PT services for a few visits in order to improve independence with ambulation while monitoring SaO2.  Pt with SaO2 87-97% during ambulation.  Pt also with occasional unsteadiness with gait but refusing assistive device.  Pt hopeful to d/c home soon.  Pt encouraged to ambulate with staff in hallways and use cane at home for safety.  Also educated pt to use call bell when needing to be up around room for safety 2* unsteady during gait. PT Recommendation/Assessment: Patient will need skilled PT in the acute care venue PT Problem  List: Decreased mobility;Decreased safety awareness;Decreased knowledge of use of DME PT Therapy Diagnosis : Difficulty walking PT Plan PT Treatment/Interventions: DME instruction;Gait training;Functional mobility training;Patient/family education PT Recommendation Follow Up Recommendations: 24 hour supervision/assistance Equipment Recommended: None recommended by PT PT Goals  Acute Rehab PT Goals PT Goal Formulation: With patient Time For Goal Achievement: 4 days (2-3 visits) Pt will Ambulate: >150 feet;with modified independence;Other (comment) (No assistive device, SaO2 >92%) PT Goal: Ambulate - Progress: Not met  PT Evaluation Precautions/Restrictions  Precautions Precautions: Fall Prior Functioning  Home Living Lives With: Spouse Type of Home: House Home Layout: One level Home Access: Level entry Home Adaptive Equipment: Straight cane Prior Function Level of Independence: Independent with basic ADLs;Independent with gait;Independent with transfers Cognition Cognition Arousal/Alertness: Awake/alert Overall Cognitive Status: Appears within functional limits for tasks assessed Sensation/Coordination   Extremity Assessment RLE Assessment RLE Assessment: Within Functional Limits LLE Assessment LLE Assessment: Within Functional Limits Mobility (including Balance) Bed Mobility Bed Mobility: Yes Supine to Sit: 6: Modified independent (Device/Increase time) Transfers Transfers: Yes Stand to Sit: 5: Supervision;To chair/3-in-1;To bed;With upper extremity assist Stand to Sit Details: verbal cues for hand placement Ambulation/Gait Ambulation/Gait: Yes Ambulation/Gait Assistance: 4: Min assist Ambulation/Gait Assistance Details (indicate cue type and reason): min/guard, pt with occasional unsteady and reports "it's just because I haven't walked in a couple days."  pt supported herself by holding therapists arm.  pt refused cane or RW.  SaO2 on room air throughout eval: 98%  pregait, 87-91% during gait, and 97% postgait Ambulation Distance (Feet): 300 Feet Assistive device: 1 person hand held assist Gait Pattern: Step-through pattern (narrow BOS)    Exercise    End of Session PT - End of Session Activity Tolerance: Patient tolerated treatment well Patient left: with family/visitor present;in chair General Behavior During Session: [facility] for tasks performed Cognition: [facility] for tasks performed  Kallan Bischoff,KATHrine E 09/10/2011, 12:51 PM Pager: [phone]

## 2011-09-10 NOTE — Progress Notes (Signed)
SUBJECTIVE Couldn't sleep last night. Says she saw flashes after receiving solumedrol and thinks the solumedrol pumped her up such that she couldn't sleep. Her breathing is better.   1. COPD exacerbation     Past Medical History  Diagnosis Date  . Dermatitis   . GERD (gastroesophageal reflux disease)   . Depression   . Aortic stenosis     mild by echo (2008)  . Paroxysmal atrial fibrillation 11/08  . Allergic rhinitis   . Rheumatoid arthritis   . Hypertension   . Bronchitis, chronic obstructive, with exacerbation   . FHx: colon cancer   . Stasis dermatitis   . Multiple sclerosis   . COPD (chronic obstructive pulmonary disease)   . Asthma   . Acute urinary tract infection    Current Facility-Administered Medications  Medication Dose Route Frequency Provider Last Rate Last Dose  . acetaminophen (TYLENOL) tablet 650 mg  650 mg Oral Q6H PRN Shanker Ghimire       Or  . acetaminophen (TYLENOL) suppository 650 mg  650 mg Rectal Q6H PRN Shanker Ghimire      . albuterol (PROVENTIL) (5 MG/ML) 0.5% nebulizer solution 2.5 mg  2.5 mg Nebulization Q2H PRN Shanker Ghimire      . ALPRAZolam (XANAX) tablet 0.25 mg  0.25 mg Oral QHS PRN Shanker Ghimire      . alum & mag hydroxide-simeth (MAALOX/MYLANTA) 200-200-20 MG/5ML suspension 30 mL  30 mL Oral Q6H PRN Shanker Ghimire      . amoxicillin-clavulanate (AUGMENTIN) 875-125 MG per tablet 1 tablet  1 tablet Oral Q12H Alaylah Heatherington      . aspirin EC tablet 81 mg  81 mg Oral Daily Shanker Ghimire   81 mg at 09/10/11 1000  . bisacodyl (DULCOLAX) suppository 10 mg  10 mg Rectal Daily PRN Shanker Ghimire      . dextromethorphan-guaiFENesin (MUCINEX DM) 30-600 MG per 12 hr tablet 2 tablet  2 tablet Oral Q12H Shanker Ghimire   2 tablet at 09/10/11 0547  . enoxaparin (LOVENOX) injection 40 mg  40 mg Subcutaneous Q24H Shanker Ghimire   40 mg at 09/09/11 2130  . folic acid (FOLVITE) tablet 1 mg  1 mg Oral Daily Shanker Ghimire   1 mg at 09/10/11 1013  .  HYDROcodone-acetaminophen (NORCO) 5-325 MG per tablet 1-2 tablet  1-2 tablet Oral Q4H PRN Shanker Ghimire      . ipratropium (ATROVENT) nebulizer solution 0.5 mg  0.5 mg Nebulization Q6H Shanker Ghimire   0.5 mg at 09/10/11 0918  . levalbuterol (XOPENEX) nebulizer solution 0.63 mg  0.63 mg Nebulization Q6H Shanker Ghimire   0.63 mg at 09/10/11 0917  . levofloxacin (LEVAQUIN) tablet 500 mg  500 mg Oral Daily Verl Whitmore      . metoprolol tartrate (LOPRESSOR) tablet 25 mg  25 mg Oral BID Shanker Ghimire   25 mg at 09/10/11 1013  . ondansetron (ZOFRAN) tablet 4 mg  4 mg Oral Q6H PRN Shanker Ghimire       Or  . ondansetron (ZOFRAN) injection 4 mg  4 mg Intravenous Q6H PRN Shanker Ghimire      . pantoprazole (PROTONIX) EC tablet 40 mg  40 mg Oral Q1200 Laikynn Pollio   40 mg at 09/10/11 1156  . predniSONE (DELTASONE) tablet 40 mg  40 mg Oral Q breakfast Nishi Neiswonger      . senna (SENOKOT) tablet 8.6 mg  1 tablet Oral BID Shanker Ghimire   8.6 mg at 09/10/11 1013  . simvastatin (ZOCOR)  tablet 5 mg  5 mg Oral QHS Shanker Ghimire   5 mg at 09/09/11 2130  . zolpidem (AMBIEN) tablet 5 mg  5 mg Oral QHS PRN Aryanna Shaver      . DISCONTD: Levofloxacin (LEVAQUIN) IVPB 750 mg  750 mg Intravenous Q24H Shanker Ghimire   750 mg at 09/09/11 1732  . DISCONTD: methylPREDNISolone sodium succinate (SOLU-MEDROL) 125 MG injection 60 mg  60 mg Intravenous Q12H Dillion Stowers   60 mg at 09/10/11 1013   Allergies  Allergen Reactions  . Daliresp (Roflumilast) Other (See Comments)    unknown  . Diltiazem Nausea Only  . Montelukast Sodium     REACTION: flu-like symptoms  . Zafirlukast Other (See Comments)    unknown   Active Problems:  HYPERTENSION  AORTIC STENOSIS  BRONCHITIS, OBSTRUCTIVE CHRONIC, ACUTE EXACERBATION  PAF (paroxysmal atrial fibrillation)  Dyslipidemia  Anxiety   Vital signs in last 24 hours: Temp:  [98.4 F (36.9 C)-98.8 F (37.1 C)] 98.8 F (37.1 C) (12/20 1411) Pulse Rate:  [75-81] 75   (12/20 1411) Resp:  [15-16] 16  (12/20 1411) BP: (130-149)/(76-83) 149/78 mmHg (12/20 1411) SpO2:  [93 %-99 %] 94 % (12/20 1411) Weight:  [55.8 kg (123 lb 0.3 oz)] 123 lb 0.3 oz (55.8 kg) (12/20 0535) Weight change: -5.436 kg (-11 lb 15.7 oz) Last BM Date: 09/09/11  Intake/Output from previous day: 12/19 0701 - 12/20 0700 In: 1530 [P.O.:1380; IV Piggyback:150] Out: 1125 [Urine:1125] Intake/Output this shift: Total I/O In: 720 [P.O.:720] Out: 1001 [Urine:1000; Stool:1]  Lab Results:  [facility] 09/10/11 0336 09/09/11 0325  WBC 20.0* 12.9*  HGB 12.6 13.2  HCT 36.4 39.1  PLT 218 200   BMET  Basename 09/10/11 0336 09/09/11 0325  NA 137 138  K 4.0 4.4  CL 101 100  CO2 30 31  GLUCOSE 176* 147*  BUN 20 16  CREATININE 0.64 0.58  CALCIUM 8.9 9.0    Studies/Results: Ct Chest Wo Contrast  09/08/2011  *RADIOLOGY REPORT*  Clinical Data: Cough and weakness.  CT CHEST WITHOUT CONTRAST  Technique:  Multidetector CT imaging of the chest was performed following the standard protocol without IV contrast.  Comparison: Chest x-ray earlier today.  Findings: There is underlying COPD and emphysematous lung disease. Right suprahilar region shows thickened and mildly dilated upper lobe bronchi likely reflecting chronic changes and potentially early bronchiectasis.  No evidence of definite focal pneumonia.  No edema or effusions.  The heart size is normal.  Calcified plaque noted at the level of the distal left main coronary artery and possibly proximal LAD.  No pulmonary nodules or enlarged lymph nodes identified.  Diffuse spondylosis noted of the thoracic spine.  IMPRESSION: COPD and emphysema.  Thickened and mildly dilated right upper lobe bronchi. Calcified coronary plaque.  Original Report Authenticated By: Reola Calkins, M.D.    Medications: I have reviewed the patient's current medications.   Physical exam GENERAL- alert HEAD- normal atraumatic, no neck masses, normal thyroid, no  jvd RESPIRATORY- no wheezing. Some scant rhonchi. CVS- regular rate and rhythm, S1, S2 normal, no murmur, click, rub or gallop ABDOMEN- abdomen is soft without significant tenderness, masses, organomegaly or guarding NEURO- Grossly normal EXTREMITIES- extremities normal, atraumatic, no cyanosis or edema  Plan  1. BRONCHITIS, OBSTRUCTIVE CHRONIC, ACUTE EXACERBATION- leukocytosis ?steroid induced. Will switch patient to po levaquin/augmentin, given failing multiple abx outpatient. Switch steroids to prednisone. 2. HYPERTENSION- BP on high side. Steroid element. Resume hctz.  3. AORTIC STENOSIS/ PAF (paroxysmal atrial fibrillation)-  on asa, stable  4. Dyslipidemia/ Anxiety- stable.  5. Dvt/gi prophylaxis 6. Disposition- hopefully home tomorrow.      Hether Anselmo 09/10/2011 4:43 PM Pager: [phone].

## 2011-09-10 NOTE — Progress Notes (Signed)
09-10-11 Spoke with patient and husband at bedside. [name] PCP is Dr. Gwyneth Sprout in [place], [place]. She is followed by Dr. Melba Coon with [facility]. Pt states she is very independent. Wears home 02 only at night. o2 supplied by [employer]. Pt states she does not need or wear 02 during the day nor does she think she needs it. Therefore, CM will not need to request portable o2 tank from [employer] at this time. No further needs assessed.   Raiford Noble, RN, BSN, [place] [phone]

## 2011-09-11 LAB — GLUCOSE, CAPILLARY

## 2011-09-11 LAB — BASIC METABOLIC PANEL
BUN: 17 mg/dL (ref 6–23)
Calcium: 9.2 mg/dL (ref 8.4–10.5)
Creatinine, Ser: 0.66 mg/dL (ref 0.50–1.10)
GFR calc non Af Amer: 84 mL/min — ABNORMAL LOW (ref >90)
Glucose, Bld: 157 mg/dL — ABNORMAL HIGH (ref 70–99)
Potassium: 4 mEq/L (ref 3.5–5.1)

## 2011-09-11 LAB — CBC
HCT: 38 % (ref 36.0–46.0)
Hemoglobin: 12.7 g/dL (ref 12.0–15.0)
MCH: 29.7 pg (ref 26.0–34.0)
MCHC: 33.4 g/dL (ref 30.0–36.0)
MCV: 88.8 fL (ref 78.0–100.0)

## 2011-09-11 MED ORDER — LEVOFLOXACIN 500 MG PO TABS: 500.0000 mg | ORAL_TABLET | Freq: Every day | ORAL | Status: AC

## 2011-09-11 MED ORDER — METOPROLOL TARTRATE 25 MG PO TABS: 25.0000 mg | ORAL_TABLET | Freq: Two times a day (BID) | ORAL | Status: DC

## 2011-09-11 MED ORDER — LEVALBUTEROL HCL 0.63 MG/3ML IN NEBU
0.6300 mg | INHALATION_SOLUTION | Freq: Three times a day (TID) | RESPIRATORY_TRACT | Status: DC
  Administered 2011-09-11: 0.63 mg via RESPIRATORY_TRACT
  Filled 2011-09-11 (×3): qty 3

## 2011-09-11 MED ORDER — IPRATROPIUM BROMIDE 0.02 % IN SOLN
0.5000 mg | Freq: Three times a day (TID) | RESPIRATORY_TRACT | Status: DC
  Administered 2011-09-11: 0.5 mg via RESPIRATORY_TRACT
  Filled 2011-09-11: qty 2.5

## 2011-09-11 MED ORDER — ASPIRIN 81 MG PO TBEC: 81.0000 mg | DELAYED_RELEASE_TABLET | Freq: Every day | ORAL | Status: AC

## 2011-09-11 MED ORDER — AMOXICILLIN-POT CLAVULANATE 875-125 MG PO TABS: 1.0000 | ORAL_TABLET | Freq: Two times a day (BID) | ORAL | Status: AC

## 2011-09-11 MED ORDER — PREDNISONE 10 MG PO TABS: ORAL_TABLET | ORAL | Status: DC

## 2011-09-11 MED ORDER — BIOTENE DRY MOUTH MT LIQD
15.0000 mL | Freq: Two times a day (BID) | OROMUCOSAL | Status: DC
  Administered 2011-09-11: 15 mL via OROMUCOSAL

## 2011-09-11 MED ORDER — PANTOPRAZOLE SODIUM 40 MG PO TBEC: 40.0000 mg | DELAYED_RELEASE_TABLET | Freq: Every day | ORAL | Status: DC

## 2011-09-11 MED ORDER — SODIUM CHLORIDE 0.9 % IJ SOLN
3.0000 mL | Freq: Two times a day (BID) | INTRAMUSCULAR | Status: DC
  Administered 2011-09-10 – 2011-09-11 (×2): 3 mL via INTRAVENOUS

## 2011-09-11 NOTE — Plan of Care (Signed)
Problem: Phase III Progression Outcomes Goal: Activity at appropriate level-compared to baseline (UP IN CHAIR FOR HEMODIALYSIS)  Outcome: Completed/Met Date Met:  09/11/11 Ambulated in hallway 12/20 x 2

## 2011-09-11 NOTE — Progress Notes (Signed)
Patient ambulated on Room air, O2 sats 92%.

## 2011-09-11 NOTE — Discharge Summary (Signed)
DISCHARGE SUMMARY  [name]  MR#: [id number]  DOB:1933-08-16  Date of Admission: 09/08/2011 Date of Discharge: 09/11/2011  Attending Physician:Betzaira Mentel  Patient's UXN:ATFTD,DUKGURKY Ramon Dredge, MD  Consults: none  Discharge Diagnoses: Present on Admission:  .BRONCHITIS, OBSTRUCTIVE CHRONIC, ACUTE EXACERBATION .AORTIC STENOSIS .HYPERTENSION .PAF (paroxysmal atrial fibrillation) .Dyslipidemia .Anxiety    Current Discharge Medication List    START taking these medications   Details  amoxicillin-clavulanate (AUGMENTIN) 875-125 MG per tablet Take 1 tablet by mouth every 12 (twelve) hours. Qty: 8 tablet, Refills: 0    aspirin EC 81 MG EC tablet Take 1 tablet (81 mg total) by mouth daily. Qty: 30 tablet, Refills: 0    levofloxacin (LEVAQUIN) 500 MG tablet Take 1 tablet (500 mg total) by mouth daily. Qty: 5 tablet, Refills: 0    metoprolol tartrate (LOPRESSOR) 25 MG tablet Take 1 tablet (25 mg total) by mouth 2 (two) times daily. Qty: 30 tablet, Refills: 0    pantoprazole (PROTONIX) 40 MG tablet Take 1 tablet (40 mg total) by mouth daily at 12 noon. Qty: 10 tablet, Refills: 0      CONTINUE these medications which have CHANGED   Details  predniSONE (DELTASONE) 10 MG tablet Take 4 tablets for 2 days, then 3 tablets for 3 days, then 2 tablets for 2 days, then 1 tablet for 3 days, total 21 tablets Qty: 21 tablet, Refills: 0      CONTINUE these medications which have NOT CHANGED   Details  albuterol (PROAIR HFA) 108 (90 BASE) MCG/ACT inhaler Inhale 2 puffs into the lungs every 6 (six) hours as needed. For shortness of breath    ALPRAZolam (XANAX) 0.25 MG tablet Take 1 tablet (0.25 mg total) by mouth at bedtime as needed. Qty: 30 tablet, Refills: 0    arformoterol (BROVANA) 15 MCG/2ML NEBU Take 15 mcg by nebulization 2 (two) times daily.      Dextromethorphan-Guaifenesin 60-1200 MG per 12 hr tablet Take 1 tablet by mouth every 12 (twelve) hours.        hydrochlorothiazide 25 MG tablet Take 25 mg by mouth daily.      Magnesium Oxide 400 (241.3 MG) MG TABS 1 daily Qty: 20 tablet, Refills: 0    simvastatin (ZOCOR) 10 MG tablet Take 5 mg by mouth at bedtime.       STOP taking these medications     doxycycline (VIBRAMYCIN) 100 MG capsule      moxifloxacin (AVELOX) 400 MG tablet           Hospital Course: [name] is a pleasant lady who came in with SOB, cough failing outpatient treatment for a couple of weeks. She was found to have COPD exacerbation. She was placed on IV steroids/levaquin iv, then augmentin. She has continued to do well with these interventions. A CT chest suggested COPD with thickened and mildly dilated right upper lobe bronchi. Her wbc bumped up to 21000, likely due to steroids.. It is 16,000 today. She needs optimization of her BP. She is discharged in stable condition on a few more days of augmentin/levaquin. If still respiratory issues my need fob. Will defer this decision to Dr Maple Hudson.   Day of Discharge BP 139/72  Pulse 61  Temp(Src) 98.6 F (37 C) (Oral)  Resp 18  Ht 5\' 3"  (1.6 m)  Wt 55.8 kg (123 lb 0.3 oz)  BMI 21.79 kg/m2  SpO2 92%  Physical Exam: No wheezing, comfortable at rest.  Results for orders placed during the hospital encounter of 09/08/11 (from  the past 24 hour(s))  CBC     Status: Abnormal   Collection Time   09/11/11  4:00 AM      Component Value Range   WBC 16.0 (*) 4.0 - 10.5 (K/uL)   RBC 4.28  3.87 - 5.11 (MIL/uL)   Hemoglobin 12.7  12.0 - 15.0 (g/dL)   HCT 16.1  09.6 - 04.5 (%)   MCV 88.8  78.0 - 100.0 (fL)   MCH 29.7  26.0 - 34.0 (pg)   MCHC 33.4  30.0 - 36.0 (g/dL)   RDW 40.9  81.1 - 91.4 (%)   Platelets 264  150 - 400 (K/uL)  BASIC METABOLIC PANEL     Status: Abnormal   Collection Time   09/11/11  4:00 AM      Component Value Range   Sodium 137  135 - 145 (mEq/L)   Potassium 4.0  3.5 - 5.1 (mEq/L)   Chloride 100  96 - 112 (mEq/L)   CO2 32  19 - 32 (mEq/L)    Glucose, Bld 157 (*) 70 - 99 (mg/dL)   BUN 17  6 - 23 (mg/dL)   Creatinine, Ser 7.82  0.50 - 1.10 (mg/dL)   Calcium 9.2  8.4 - 95.6 (mg/dL)   GFR calc non Af Amer 84 (*) >90 (mL/min)   GFR calc Af Amer >90  >90 (mL/min)  GLUCOSE, CAPILLARY     Status: Abnormal   Collection Time   09/11/11  7:35 AM      Component Value Range   Glucose-Capillary 124 (*) 70 - 99 (mg/dL)   Comment 1 Documented in Chart     Comment 2 Notify RN      Disposition: home today.   Follow-up Appts: Discharge Orders    Future Appointments: Provider: Department: Dept Phone: Center:   11/16/2011 1:45 PM Waymon Budge, MD [facility] [phone] None     Future Orders Please Complete By Expires   Diet - low sodium heart healthy      Increase activity slowly         Follow-up with Dr.Young and DR Marina Goodell in  1-2 weeks.   Time spent 25 minutes.  Signed: Ladamien Rammel 09/11/2011, 12:13 PM

## 2011-09-11 NOTE — Progress Notes (Signed)
Patient discharged home, all discharge medications and instructions reviewed and questions answered. Pt will call for wheelchair assistance to vehicle when her ride arrives.

## 2011-09-17 ENCOUNTER — Telehealth: Payer: Self-pay | Admitting: Internal Medicine

## 2011-09-17 NOTE — Telephone Encounter (Signed)
Pt needing a HFU in 1-2 weeks. Please advise a work in spot for pt Dr. Maple Hudson, thanks

## 2011-09-17 NOTE — Telephone Encounter (Signed)
Spoke with patient-aware of HFU on 09-28-2011 at 11am.

## 2011-09-28 ENCOUNTER — Encounter: Payer: Self-pay | Admitting: Internal Medicine

## 2011-09-28 ENCOUNTER — Ambulatory Visit (INDEPENDENT_AMBULATORY_CARE_PROVIDER_SITE_OTHER): Payer: Medicare Other | Admitting: Internal Medicine

## 2011-09-28 VITALS — BP 116/78 | HR 98 | Ht 63.0 in | Wt 135.2 lb

## 2011-09-28 DIAGNOSIS — J449 Chronic obstructive pulmonary disease, unspecified: Secondary | ICD-10-CM | POA: Diagnosis not present

## 2011-09-28 DIAGNOSIS — B009 Herpesviral infection, unspecified: Secondary | ICD-10-CM | POA: Diagnosis not present

## 2011-09-28 MED ORDER — ALBUTEROL SULFATE HFA 108 (90 BASE) MCG/ACT IN AERS: 2.0000 | INHALATION_SPRAY | Freq: Four times a day (QID) | RESPIRATORY_TRACT | Status: DC | PRN

## 2011-09-28 NOTE — Progress Notes (Signed)
Patient ID: [name], female    DOB: 1933/07/01, 78 y.o.   MRN: [id number]  HPI 48 yoF former smoker with COPD/ chronic obstructive asthma, marked labile component with recurrent acute bronchitis complicated by  Paroxysmal atrial fib and hx Aortic Stenosis. After bad 2-3 months, she cleared completely with 30 days of azithromycin given February 6. Dr Johnsie Cancel has seen her and felt her murmur is stable. In spite of pollen season she feels very well. Sleeps with O2 every night and uses it as needed in daytime.   04/21/11- 31  yoF former smoker with COPD/ chronic obstructive asthma, marked labile component with recurrent acute bronchitis complicated by  Paroxysmal atrial fib and hx Aortic Stenosis. CXR pending today She called last night with exacerbation and comes in this morning. She called her PCP 6 weeks ago with increased wheeze. Was given azithromycin x 1 month, prednisone taper then 10 mg daily. Could tend garden in heat, but remained tight in chest . In last 2 days much tighter, "full" in chest. Denies fever, sore throat, green, chest pain or swelling.  Arthritis bothering her, stressed by husband who has had another CVA, caring for 7 pets, doing a lot of canning- feels "so tired". Continues O2 2 L/M for sleep and prn.  04/30/11- 04/21/11- 78  yoF former smoker with COPD/ chronic obstructive asthma, marked labile component with recurrent acute bronchitis complicated by  Paroxysmal atrial fib and hx Aortic Stenosis Doesn't feel toxic or infected, but wheeze isn't breaking. Little phlegm. She tapered from 60 to 20 mg prednisone daily. We discussed her improvement during the winter with a prolonged course of azithromycin. We discussed the anti-inflammatory effect attributed to macrolides; also the tocolytic effect of magnesium.   05/18/11-  19  yoF former smoker with COPD/ chronic obstructive asthma, marked labile component with recurrent acute bronchitis complicated by  Paroxysmal atrial fib and hx  Aortic Stenosis She reports feeling "some better" - able now to walk out to garden. No new pain or infection or acute problems.  She has been taking zithromax 1 daily maintenance, and she completed 20 days of manesium. After last burst, prednisone is back now to 10 mg daily.   08/17/11- 34  yoF former smoker with COPD/ chronic obstructive asthma, marked labile component with recurrent acute bronchitis complicated by  Paroxysmal atrial fib and hx Aortic Stenosis She says she had been doing very well. Her family shared a viral syndrome and she got it a week ago. Describes aching low-grade fever nasal congestion fatigue with some cough and wheeze. 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 Her physician gave steroid shot, doxycycline pending today. She had tapered maintenance prednisone 5 mg every other day but increased to 10 mg daily a few days ago. Has also had left cataract surgery.  09/28/11-  72  yoF former smoker with COPD/ chronic obstructive asthma, marked labile component with recurrent acute bronchitis complicated by  Paroxysmal atrial fib and hx Aortic Stenosis Hospital follow up visit. Was hospitalized at [facility] long December 18 with a viral pattern bronchitis, green sputum. Dr. Henrene Pastor had given Z-Pak and we sent Tamiflu. She then got Augmentin plus Levaquin plus prednisone for hospital discharge. "Still not over it" with residual cough of white sputum. She has tapered prednisone back to 10 mg daily. Has a cold sore now on her upper lip which she  is treating. Discharge summary was reviewed. CT scan of chest 09/08/2011 showed COPD and emphysema with bronchitis changes in the right upper lobe, calcified coronary artery plaques. Images reviewed with her.  Review of Systems Constitutional:   No weight loss, night sweats,  +Fevers, chills,                   + fatigue, lassitude. HEENT:    No headaches,  Difficulty swallowing,  Tooth/dental problems,  Sore throat,                No sneezing, itching, ear ache, nasal congestion, post nasal drip,  CV:  No chest pain,  Orthopnea, PND, swelling in lower extremities, anasarca, dizziness, palpitations GI  No heartburn, indigestion, abdominal pain, nausea, vomiting, diarrhea, change in bowel habits, loss of appetite   Resp:-No excess mucus,  + mild cough, scant sputum,  No coughing up of blood.  No change in color of mucus.  + wheezing. Skin: no rash or lesions. GU: no dysuria, change in color of urine, no urgency or frequency.  No flank pain. MS:  + arthritis pains Psych:  No change in mood or affect. No depression or anxiety.  No memory loss.  Objective:   Physical Exam General- Alert, Oriented, Affect-appropriate, Distress- not visible; talkative Skin- cold sore on upper lip. Lymphadenopathy- none Head- atraumatic            Eyes- Gross vision intact, PERRLA, conjunctivae clear secretions            Ears- Hearing, canals normal            Nose- Clear, No-Septal dev, mucus, polyps, erosion, perforation             Throat- Mallampati II , mucosa clear , drainage- none, tonsils- atrophic Neck- flexible , trachea midline, no stridor , thyroid nl, carotid no bruit Chest - symmetrical excursion , unlabored           Heart/CV- IRR , no murmur , no gallop  , no rub, nl s1 s2                            JVD- -none, edema- none, stasis changes- none, varices- none           Lung- end expiratory wheeze, decreased breath sounds, unlabored., cough- none , dullness-none, rub- none. Able to speak in full sentences.            Chest wall-  Abd- tender-no, distended-no, bowel sounds-present, HSM- no Br/ Gen/ Rectal- Not done, not indicated Extrem- cyanosis- none, clubbing, none, atrophy- none, strength- nl     +osteoarthritis changes in hands Neuro- grossly intact to observation

## 2011-09-28 NOTE — Patient Instructions (Signed)
Script refill Proair rescue [facility]er [facility]  Continue prednisone 10 mg daily for now

## 2011-09-30 ENCOUNTER — Encounter: Payer: Self-pay | Admitting: Internal Medicine

## 2011-09-30 NOTE — Assessment & Plan Note (Signed)
She is treating this topically. We discussed etiology.

## 2011-09-30 NOTE — Assessment & Plan Note (Signed)
She had a viral chest infection/bronchitis along with the rest of the community. This will be slow to clear. She is going to continue prednisone 10 mg daily as maintenance therapy along with her regular meds.

## 2011-10-05 DIAGNOSIS — J449 Chronic obstructive pulmonary disease, unspecified: Secondary | ICD-10-CM | POA: Diagnosis not present

## 2011-10-05 DIAGNOSIS — J441 Chronic obstructive pulmonary disease with (acute) exacerbation: Secondary | ICD-10-CM | POA: Diagnosis not present

## 2011-10-05 DIAGNOSIS — I10 Essential (primary) hypertension: Secondary | ICD-10-CM | POA: Diagnosis not present

## 2011-10-05 DIAGNOSIS — G35 Multiple sclerosis: Secondary | ICD-10-CM | POA: Diagnosis not present

## 2011-10-13 ENCOUNTER — Telehealth: Payer: Self-pay | Admitting: Internal Medicine

## 2011-10-13 MED ORDER — ALPRAZOLAM 0.25 MG PO TABS: 0.2500 mg | ORAL_TABLET | Freq: Every evening | ORAL | Status: DC | PRN

## 2011-10-13 NOTE — Telephone Encounter (Signed)
Rx refill was called to pharm with 5 refills Spoke with pt's spouse and notified that this was done and he verbalized understanding.  Nothing further needed.

## 2011-10-13 NOTE — Telephone Encounter (Signed)
Per Cy-okay to refill x 5. 

## 2011-10-13 NOTE — Telephone Encounter (Signed)
Please advise if okay to refill pt's alprazolam Dr. Maple Hudson, thanks

## 2011-11-16 ENCOUNTER — Ambulatory Visit: Payer: Medicare Other | Admitting: Internal Medicine

## 2011-11-19 ENCOUNTER — Telehealth: Payer: Self-pay | Admitting: Internal Medicine

## 2011-11-19 MED ORDER — PREDNISONE 10 MG PO TABS: ORAL_TABLET | ORAL | Status: DC

## 2011-11-19 NOTE — Telephone Encounter (Signed)
Pt given CY's instructions regarding Prednisone dose.   Pt verbalized understanding.  Pt will call back if symptoms do not improve.  [facility] [place].

## 2011-11-19 NOTE — Telephone Encounter (Signed)
Per CY-okay to give Prednisone 10mg  #20 take 4 x 2 days, 3 x 2 days, 2 x 2 days, 1 x 2 days then back to whatever her current daily dose is.

## 2011-11-19 NOTE — Telephone Encounter (Signed)
Pt reports that she forgot to tell us that she increased her dose of prednisone last week to 40 mg for 3 days, then 20 mg for 3 days and she felt better for a few days then symptoms restarted 2 days ago .  Pt reports that she was afraid to go to sleep last night due to SOB and chest tightness.  Pt reports that last time her symptoms were like this she ended up in hospital.  Pt would like to be seen.  Please advise.

## 2011-11-19 NOTE — Telephone Encounter (Signed)
Pt c/o increased sob, chest tightness x 2-3 days. Sneezing and cough with clear to yellow mucus. Fatigue and weakness. She denies f/c/s. [facility] [place]. Pls advise. Allergies  Allergen Reactions  . Daliresp (Roflumilast) Other (See Comments)    unknown  . Diltiazem Nausea Only  . Montelukast Sodium     REACTION: flu-like symptoms  . Zafirlukast Other (See Comments)    unknown

## 2011-11-19 NOTE — Telephone Encounter (Signed)
Per CY- have patient increase prednisone to 60 mg daily x 3 days then 40 mg daily til improved.

## 2011-11-23 ENCOUNTER — Telehealth: Payer: Self-pay | Admitting: Internal Medicine

## 2011-11-23 NOTE — Telephone Encounter (Signed)
I spoke with the pt and she states that she has taken prednisone as directed and is currently on 40 mg daily. She states she still has a productive cough with green phlegm, sore throat, hoarseness, and PND. She is requesting an appt. Pt set to see CY tomorrow at [employer], CMA

## 2011-11-24 ENCOUNTER — Ambulatory Visit (INDEPENDENT_AMBULATORY_CARE_PROVIDER_SITE_OTHER): Payer: Medicare Other | Admitting: Internal Medicine

## 2011-11-24 ENCOUNTER — Encounter: Payer: Self-pay | Admitting: Internal Medicine

## 2011-11-24 VITALS — BP 138/80 | HR 106 | Temp 97.1°F | Ht 63.0 in | Wt 138.2 lb

## 2011-11-24 DIAGNOSIS — J449 Chronic obstructive pulmonary disease, unspecified: Secondary | ICD-10-CM | POA: Diagnosis not present

## 2011-11-24 MED ORDER — AMOXICILLIN-POT CLAVULANATE 875-125 MG PO TABS: 1.0000 | ORAL_TABLET | Freq: Two times a day (BID) | ORAL | Status: AC

## 2011-11-24 NOTE — Progress Notes (Signed)
Patient ID: [name], female    DOB: 1933/07/01, 78 y.o.   MRN: [id number]  HPI 48 yoF former smoker with COPD/ chronic obstructive asthma, marked labile component with recurrent acute bronchitis complicated by  Paroxysmal atrial fib and hx Aortic Stenosis. After bad 2-3 months, she cleared completely with 30 days of azithromycin given February 6. Dr Johnsie Cancel has seen her and felt her murmur is stable. In spite of pollen season she feels very well. Sleeps with O2 every night and uses it as needed in daytime.   04/21/11- 31  yoF former smoker with COPD/ chronic obstructive asthma, marked labile component with recurrent acute bronchitis complicated by  Paroxysmal atrial fib and hx Aortic Stenosis. CXR pending today She called last night with exacerbation and comes in this morning. She called her PCP 6 weeks ago with increased wheeze. Was given azithromycin x 1 month, prednisone taper then 10 mg daily. Could tend garden in heat, but remained tight in chest . In last 2 days much tighter, "full" in chest. Denies fever, sore throat, green, chest pain or swelling.  Arthritis bothering her, stressed by husband who has had another CVA, caring for 7 pets, doing a lot of canning- feels "so tired". Continues O2 2 L/M for sleep and prn.  04/30/11- 04/21/11- 78  yoF former smoker with COPD/ chronic obstructive asthma, marked labile component with recurrent acute bronchitis complicated by  Paroxysmal atrial fib and hx Aortic Stenosis Doesn't feel toxic or infected, but wheeze isn't breaking. Little phlegm. She tapered from 60 to 20 mg prednisone daily. We discussed her improvement during the winter with a prolonged course of azithromycin. We discussed the anti-inflammatory effect attributed to macrolides; also the tocolytic effect of magnesium.   05/18/11-  19  yoF former smoker with COPD/ chronic obstructive asthma, marked labile component with recurrent acute bronchitis complicated by  Paroxysmal atrial fib and hx  Aortic Stenosis She reports feeling "some better" - able now to walk out to garden. No new pain or infection or acute problems.  She has been taking zithromax 1 daily maintenance, and she completed 20 days of manesium. After last burst, prednisone is back now to 10 mg daily.   08/17/11- 34  yoF former smoker with COPD/ chronic obstructive asthma, marked labile component with recurrent acute bronchitis complicated by  Paroxysmal atrial fib and hx Aortic Stenosis She says she had been doing very well. Her family shared a viral syndrome and she got it a week ago. Describes aching low-grade fever nasal congestion fatigue with some cough and wheeze. 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 Her physician gave steroid shot, doxycycline pending today. She had tapered maintenance prednisone 5 mg every other day but increased to 10 mg daily a few days ago. Has also had left cataract surgery.  09/28/11-  72  yoF former smoker with COPD/ chronic obstructive asthma, marked labile component with recurrent acute bronchitis complicated by  Paroxysmal atrial fib and hx Aortic Stenosis Hospital follow up visit. Was hospitalized at [facility] long December 18 with a viral pattern bronchitis, green sputum. Dr. Henrene Pastor had given Z-Pak and we sent Tamiflu. She then got Augmentin plus Levaquin plus prednisone for hospital discharge. "Still not over it" with residual cough of white sputum. She has tapered prednisone back to 10 mg daily. Has a cold sore now on her upper lip which she  is treating. Discharge summary was reviewed. CT scan of chest 09/08/2011 showed COPD and emphysema with bronchitis changes in the right upper lobe, calcified coronary artery plaques. Images reviewed with her.  11/24/11-  64  yoF former smoker with COPD/ chronic obstructive asthma, marked labile component with recurrent acute bronchitis  complicated by  Paroxysmal atrial fib and hx Aortic Stenosis Now on prednisone taper day 5, started at 60 mg daily. Somewhat better. This exacerbation started when she stood out in the cold. Next day she was forced with yellow postnasal drainage. Now coughing productive green and yellow. No blood no chest pain. Father smoked and died of COPD. We had an end of life discussion. She would favor resuscitation but not sustained life support.  Review of Systems-see HPI Constitutional:   No weight loss, night sweats,  +Fevers, chills,                   + fatigue, lassitude. HEENT:   No headaches,  Difficulty swallowing,  Tooth/dental problems,  Sore throat,                No sneezing, itching, ear ache, +nasal congestion, post nasal drip,  CV:  No chest pain,  Orthopnea, PND, swelling in lower extremities, anasarca, dizziness, palpitations GI  No heartburn, indigestion, abdominal pain, nausea,   Resp:-No excess mucus,  + mild cough, scant sputum,  No coughing up of blood.  No change in color of mucus.  + wheezing. Skin: no rash or lesions. GU: . MS:  + arthritis pains Psych:  No change in mood or affect. No depression or anxiety.  No memory loss.  Objective:   Physical Exam General- Alert, Oriented, Affect-appropriate, Distress- not visible; talkative Skin- cold sore on upper lip. Lymphadenopathy- none Head- atraumatic            Eyes- Gross vision intact, PERRLA, conjunctivae clear secretions            Ears- Hearing, canals normal            Nose- Clear, No-Septal dev, mucus, polyps, erosion, perforation             Throat- Mallampati II , mucosa clear , drainage- none, tonsils- atrophic, hoarse Neck- flexible , trachea midline, no stridor , thyroid nl, carotid no bruit Chest - symmetrical excursion , unlabored           Heart/CV- IRR , no murmur , no gallop  , no rub, nl s1 s2                            JVD- -none, edema- none, stasis changes- none, varices- none           Lung- expiratory  wheeze, decreased breath sounds, unlabored., cough+ , dullness-none, rub- none.            Chest wall-  Abd-  Br/ Gen/ Rectal- Not done, not indicated Extrem- cyanosis- none, clubbing, none, atrophy- none, strength- nl     +osteoarthritis changes in hands Neuro- grossly intact to observation

## 2011-11-24 NOTE — Patient Instructions (Addendum)
Script sent for augmentin  Take Mucinex and extra water, and finish the prednisone taper  When you come back, we will talk about staying on a maintenance antibiotic for a while

## 2011-11-26 ENCOUNTER — Ambulatory Visit: Payer: Medicare Other | Admitting: Internal Medicine

## 2011-11-26 ENCOUNTER — Telehealth: Payer: Self-pay | Admitting: Internal Medicine

## 2011-11-26 MED ORDER — MOXIFLOXACIN HCL 400 MG PO TABS: 400.0000 mg | ORAL_TABLET | Freq: Every day | ORAL | Status: DC

## 2011-11-26 NOTE — Telephone Encounter (Signed)
D/C augmentin- "sorry she didn't tolerate it this time" Offer Avelox 400 mg, # 7, 1 daily

## 2011-11-26 NOTE — Telephone Encounter (Signed)
Spoke with pt and notified will d/c augmentin and start avelox. Pt verbalized understanding. Rx was sent to pharm. I have added augmentin to her list of intolerances.

## 2011-11-26 NOTE — Telephone Encounter (Signed)
Called spoke with patient who reports she took 1 dose of augmentin the evening of her appt on 3.5.13 with dinner.  All that night and yesterday she was "dealthy ill" with nausea and diarrhea.  Symptoms have since subsided.  Respiratory symptoms from ov are no better with prod cough with yellow/green mucus, wheezing, hoarseness and SOB.  Advised may have been a virus but pt stated she is scared to take the augmentin again.  Dr Maple Hudson please advise, thanks.

## 2011-11-28 NOTE — Assessment & Plan Note (Signed)
Acute exacerbation pollen exposure to cold while she stood out in her yard watching some service work. Plan-finish prednisone taper. Augmentin. Note end of life discussion. She would not want sustained life support.

## 2011-12-07 ENCOUNTER — Emergency Department (HOSPITAL_COMMUNITY): Payer: Medicare Other

## 2011-12-07 ENCOUNTER — Other Ambulatory Visit: Payer: Self-pay

## 2011-12-07 ENCOUNTER — Inpatient Hospital Stay (HOSPITAL_COMMUNITY)
Admission: EM | Admit: 2011-12-07 | Discharge: 2011-12-14 | DRG: 190 | Disposition: A | Discharge status: Home Health | Payer: Medicare Other | Attending: Pulmonary Disease | Admitting: Pulmonary Disease

## 2011-12-07 ENCOUNTER — Encounter (HOSPITAL_COMMUNITY): Payer: Self-pay | Admitting: *Deleted

## 2011-12-07 ENCOUNTER — Telehealth: Payer: Self-pay | Admitting: Internal Medicine

## 2011-12-07 DIAGNOSIS — Z883 Allergy status to other anti-infective agents status: Secondary | ICD-10-CM

## 2011-12-07 DIAGNOSIS — J9620 Acute and chronic respiratory failure, unspecified whether with hypoxia or hypercapnia: Secondary | ICD-10-CM | POA: Diagnosis present

## 2011-12-07 DIAGNOSIS — J449 Chronic obstructive pulmonary disease, unspecified: Secondary | ICD-10-CM | POA: Diagnosis not present

## 2011-12-07 DIAGNOSIS — Z836 Family history of other diseases of the respiratory system: Secondary | ICD-10-CM | POA: Diagnosis not present

## 2011-12-07 DIAGNOSIS — F411 Generalized anxiety disorder: Secondary | ICD-10-CM | POA: Diagnosis present

## 2011-12-07 DIAGNOSIS — G35 Multiple sclerosis: Secondary | ICD-10-CM | POA: Diagnosis present

## 2011-12-07 DIAGNOSIS — I4891 Unspecified atrial fibrillation: Secondary | ICD-10-CM | POA: Diagnosis present

## 2011-12-07 DIAGNOSIS — J441 Chronic obstructive pulmonary disease with (acute) exacerbation: Secondary | ICD-10-CM | POA: Diagnosis not present

## 2011-12-07 DIAGNOSIS — K219 Gastro-esophageal reflux disease without esophagitis: Secondary | ICD-10-CM | POA: Diagnosis present

## 2011-12-07 DIAGNOSIS — I10 Essential (primary) hypertension: Secondary | ICD-10-CM | POA: Diagnosis present

## 2011-12-07 DIAGNOSIS — J42 Unspecified chronic bronchitis: Secondary | ICD-10-CM | POA: Diagnosis not present

## 2011-12-07 DIAGNOSIS — R918 Other nonspecific abnormal finding of lung field: Secondary | ICD-10-CM | POA: Diagnosis not present

## 2011-12-07 DIAGNOSIS — R0602 Shortness of breath: Secondary | ICD-10-CM | POA: Diagnosis not present

## 2011-12-07 DIAGNOSIS — F3289 Other specified depressive episodes: Secondary | ICD-10-CM | POA: Diagnosis present

## 2011-12-07 DIAGNOSIS — E785 Hyperlipidemia, unspecified: Secondary | ICD-10-CM | POA: Diagnosis present

## 2011-12-07 DIAGNOSIS — Z7982 Long term (current) use of aspirin: Secondary | ICD-10-CM

## 2011-12-07 DIAGNOSIS — Z79899 Other long term (current) drug therapy: Secondary | ICD-10-CM

## 2011-12-07 DIAGNOSIS — L299 Pruritus, unspecified: Secondary | ICD-10-CM | POA: Diagnosis not present

## 2011-12-07 DIAGNOSIS — J438 Other emphysema: Secondary | ICD-10-CM

## 2011-12-07 DIAGNOSIS — F329 Major depressive disorder, single episode, unspecified: Secondary | ICD-10-CM | POA: Diagnosis present

## 2011-12-07 DIAGNOSIS — Z800 Family history of malignant neoplasm of digestive organs: Secondary | ICD-10-CM | POA: Diagnosis not present

## 2011-12-07 DIAGNOSIS — Z888 Allergy status to other drugs, medicaments and biological substances status: Secondary | ICD-10-CM | POA: Diagnosis not present

## 2011-12-07 DIAGNOSIS — M069 Rheumatoid arthritis, unspecified: Secondary | ICD-10-CM | POA: Diagnosis present

## 2011-12-07 DIAGNOSIS — R000 Tachycardia, unspecified: Secondary | ICD-10-CM | POA: Diagnosis not present

## 2011-12-07 DIAGNOSIS — R6889 Other general symptoms and signs: Secondary | ICD-10-CM | POA: Diagnosis not present

## 2011-12-07 DIAGNOSIS — I498 Other specified cardiac arrhythmias: Secondary | ICD-10-CM | POA: Diagnosis present

## 2011-12-07 DIAGNOSIS — T40605A Adverse effect of unspecified narcotics, initial encounter: Secondary | ICD-10-CM | POA: Diagnosis not present

## 2011-12-07 DIAGNOSIS — I369 Nonrheumatic tricuspid valve disorder, unspecified: Secondary | ICD-10-CM | POA: Diagnosis not present

## 2011-12-07 DIAGNOSIS — I359 Nonrheumatic aortic valve disorder, unspecified: Secondary | ICD-10-CM | POA: Diagnosis not present

## 2011-12-07 LAB — URINALYSIS, ROUTINE W REFLEX MICROSCOPIC
Glucose, UA: NEGATIVE mg/dL
Leukocytes, UA: NEGATIVE
Nitrite: NEGATIVE
Specific Gravity, Urine: 1.012 (ref 1.005–1.030)
pH: 6 (ref 5.0–8.0)

## 2011-12-07 LAB — CBC
HCT: 43.6 % (ref 36.0–46.0)
HCT: 41.9 % (ref 36.0–46.0)
Hemoglobin: 13.6 g/dL (ref 12.0–15.0)
MCH: 29.8 pg (ref 26.0–34.0)
MCV: 91 fL (ref 78.0–100.0)
MCV: 91.7 fL (ref 78.0–100.0)
Platelets: 206 10*3/uL (ref 150–400)
Platelets: 187 10*3/uL (ref 150–400)
RBC: 4.79 MIL/uL (ref 3.87–5.11)
RBC: 4.57 MIL/uL (ref 3.87–5.11)
WBC: 13.1 10*3/uL — ABNORMAL HIGH (ref 4.0–10.5)

## 2011-12-07 LAB — POCT I-STAT, CHEM 8
BUN: 21 mg/dL (ref 6–23)
Calcium, Ion: 1.04 mmol/L — ABNORMAL LOW (ref 1.12–1.32)
Chloride: 99 mEq/L (ref 96–112)
Glucose, Bld: 154 mg/dL — ABNORMAL HIGH (ref 70–99)

## 2011-12-07 LAB — COMPREHENSIVE METABOLIC PANEL
AST: 18 U/L (ref 0–37)
Albumin: 3.2 g/dL — ABNORMAL LOW (ref 3.5–5.2)
Chloride: 94 mEq/L — ABNORMAL LOW (ref 96–112)
Creatinine, Ser: 0.68 mg/dL (ref 0.50–1.10)
Potassium: 3.6 mEq/L (ref 3.5–5.1)
Total Bilirubin: 0.3 mg/dL (ref 0.3–1.2)

## 2011-12-07 LAB — CARDIAC PANEL(CRET KIN+CKTOT+MB+TROPI)
CK, MB: 4.1 ng/mL — ABNORMAL HIGH (ref 0.3–4.0)
Relative Index: INVALID (ref 0.0–2.5)
Troponin I: <0.3 ng/mL (ref <0.30)

## 2011-12-07 LAB — PHOSPHORUS: Phosphorus: 3.8 mg/dL (ref 2.3–4.6)

## 2011-12-07 MED ORDER — IPRATROPIUM BROMIDE 0.02 % IN SOLN
1.0000 mg | Freq: Once | RESPIRATORY_TRACT | Status: AC
  Administered 2011-12-07: 1 mg via RESPIRATORY_TRACT
  Filled 2011-12-07: qty 5

## 2011-12-07 MED ORDER — ACETAMINOPHEN 325 MG PO TABS
650.0000 mg | ORAL_TABLET | Freq: Once | ORAL | Status: AC
  Administered 2011-12-07: 650 mg via ORAL
  Filled 2011-12-07: qty 2

## 2011-12-07 MED ORDER — IPRATROPIUM BROMIDE 0.02 % IN SOLN
0.5000 mg | Freq: Four times a day (QID) | RESPIRATORY_TRACT | Status: DC
  Administered 2011-12-07 – 2011-12-14 (×28): 0.5 mg via RESPIRATORY_TRACT
  Filled 2011-12-07 (×28): qty 2.5

## 2011-12-07 MED ORDER — ALBUTEROL SULFATE (5 MG/ML) 0.5% IN NEBU
INHALATION_SOLUTION | RESPIRATORY_TRACT | Status: AC
  Administered 2011-12-07: 11:00:00
  Filled 2011-12-07: qty 1

## 2011-12-07 MED ORDER — MOXIFLOXACIN HCL IN NACL 400 MG/250ML IV SOLN
400.0000 mg | INTRAVENOUS | Status: DC
  Administered 2011-12-07: 400 mg via INTRAVENOUS
  Filled 2011-12-07 (×2): qty 250

## 2011-12-07 MED ORDER — SODIUM CHLORIDE 0.9 % IV SOLN
INTRAVENOUS | Status: DC
  Administered 2011-12-07: 15:00:00 via INTRAVENOUS

## 2011-12-07 MED ORDER — IPRATROPIUM BROMIDE 0.02 % IN SOLN
RESPIRATORY_TRACT | Status: AC
  Administered 2011-12-07: 11:00:00
  Filled 2011-12-07: qty 5

## 2011-12-07 MED ORDER — PANTOPRAZOLE SODIUM 40 MG IV SOLR
40.0000 mg | Freq: Every day | INTRAVENOUS | Status: DC
  Administered 2011-12-07: 40 mg via INTRAVENOUS
  Filled 2011-12-07 (×2): qty 40

## 2011-12-07 MED ORDER — METHYLPREDNISOLONE SODIUM SUCC 125 MG IJ SOLR
INTRAMUSCULAR | Status: AC
  Administered 2011-12-07: 11:00:00
  Filled 2011-12-07: qty 2

## 2011-12-07 MED ORDER — MUPIROCIN 2 % EX OINT
1.0000 "application " | TOPICAL_OINTMENT | Freq: Two times a day (BID) | CUTANEOUS | Status: AC
  Administered 2011-12-07 – 2011-12-12 (×10): 1 via NASAL
  Filled 2011-12-07: qty 22

## 2011-12-07 MED ORDER — METHYLPREDNISOLONE SODIUM SUCC 125 MG IJ SOLR
60.0000 mg | Freq: Two times a day (BID) | INTRAMUSCULAR | Status: DC
  Administered 2011-12-07 – 2011-12-10 (×5): 60 mg via INTRAVENOUS
  Filled 2011-12-07: qty 2
  Filled 2011-12-07: qty 0.96
  Filled 2011-12-07: qty 2
  Filled 2011-12-07 (×2): qty 0.96
  Filled 2011-12-07: qty 2
  Filled 2011-12-07 (×3): qty 0.96

## 2011-12-07 MED ORDER — CHLORHEXIDINE GLUCONATE CLOTH 2 % EX PADS: 6.0000 | MEDICATED_PAD | Freq: Every day | CUTANEOUS | Status: DC

## 2011-12-07 MED ORDER — BUDESONIDE 0.25 MG/2ML IN SUSP
0.2500 mg | Freq: Four times a day (QID) | RESPIRATORY_TRACT | Status: DC
  Administered 2011-12-07 – 2011-12-14 (×28): 0.25 mg via RESPIRATORY_TRACT
  Filled 2011-12-07 (×37): qty 2

## 2011-12-07 MED ORDER — METHYLPREDNISOLONE SODIUM SUCC 40 MG IJ SOLR
INTRAMUSCULAR | Status: AC
  Filled 2011-12-07: qty 1

## 2011-12-07 MED ORDER — ALBUTEROL SULFATE (5 MG/ML) 0.5% IN NEBU
10.0000 mg | INHALATION_SOLUTION | Freq: Once | RESPIRATORY_TRACT | Status: AC
  Administered 2011-12-07: 10 mg via RESPIRATORY_TRACT
  Filled 2011-12-07: qty 2

## 2011-12-07 MED ORDER — LEVALBUTEROL HCL 0.63 MG/3ML IN NEBU
0.6300 mg | INHALATION_SOLUTION | Freq: Four times a day (QID) | RESPIRATORY_TRACT | Status: DC
  Administered 2011-12-07 – 2011-12-14 (×28): 0.63 mg via RESPIRATORY_TRACT
  Filled 2011-12-07 (×36): qty 3

## 2011-12-07 MED ORDER — ALPRAZOLAM 0.25 MG PO TABS
0.2500 mg | ORAL_TABLET | Freq: Every evening | ORAL | Status: DC | PRN
  Administered 2011-12-07 – 2011-12-13 (×3): 0.25 mg via ORAL
  Filled 2011-12-07 (×6): qty 1

## 2011-12-07 MED ORDER — ENOXAPARIN SODIUM 40 MG/0.4ML ~~LOC~~ SOLN
40.0000 mg | SUBCUTANEOUS | Status: DC
  Administered 2011-12-08 – 2011-12-11 (×4): 40 mg via SUBCUTANEOUS
  Filled 2011-12-07 (×8): qty 0.4

## 2011-12-07 NOTE — H&P (Signed)
Name: [name] MRN: [id number] DOB: 06/14/1933    LOS: 0 Requesting MD:: Ethelda Chick, S Pulmonary: Young  PCCM ADMIT NOTE  PT PROFILE: CY patient with COPD - moderate at baseline, admitted 3/18 with AECOPD   History of Present Illness: 78 yo WF pulmonary Pt of Dr. Maple Hudson who presents with 4-5 days of increasing SOB ,fever with out chills or sputum production. She was last hospitalized in 08/2011 for same and felt that she was discharged too early. She repots that she has never returned to her previous baseline which is independent living with mild exertional limitation due to SOB. She denies CP, hemoptysis, LE edema and calf tenderness.  She is using her O2 more over the last 3 days. PCCM will admit ad tx with IV steroids BD'S and empiric abxs   Lines / Drains: None  Cultures:   Antibiotics: 3/18 avelox>>  Tests / Events:   Subjective: NAD at rest Past Medical History  Diagnosis Date  . Dermatitis   . GERD (gastroesophageal reflux disease)   . Depression   . Aortic stenosis     mild by echo (2008)  . Paroxysmal atrial fibrillation 11/08  . Allergic rhinitis   . Rheumatoid arthritis   . Hypertension   . Bronchitis, chronic obstructive, with exacerbation   . FHx: colon cancer   . Stasis dermatitis   . Multiple sclerosis   . COPD (chronic obstructive pulmonary disease)   . Asthma   . Acute urinary tract infection    Past Surgical History  Procedure Date  . Abdominal hysterectomy   . Breast cyst excision   . Lumbar disc surgery 2003    L5   Prior to Admission medications   Medication Sig Start Date End Date Taking? Authorizing Provider  albuterol (PROAIR HFA) 108 (90 BASE) MCG/ACT inhaler Inhale 2 puffs into the lungs every 6 (six) hours as needed for wheezing or shortness of breath. For shortness of breath 09/28/11 09/27/12 Yes Waymon Budge, MD  ALPRAZolam Prudy Feeler) 0.25 MG tablet Take 1 tablet (0.25 mg total) by mouth at bedtime as needed. 10/13/11  Yes Waymon Budge, MD  amoxicillin-clavulanate (AUGMENTIN) 875-125 MG per tablet Take 1 tablet by mouth 2 (two) times daily. 11/24/11 12/08/11 Yes Clinton D Young, MD  arformoterol (BROVANA) 15 MCG/2ML NEBU Take 15 mcg by nebulization 2 (two) times daily.     Yes Historical Provider, MD  aspirin EC 81 MG EC tablet Take 1 tablet (81 mg total) by mouth daily. 09/11/11 09/10/12 Yes Simbiso Ranga, MD  Dextromethorphan-Guaifenesin 60-1200 MG per 12 hr tablet Take 1 tablet by mouth every 12 (twelve) hours.     Yes Historical Provider, MD  hydrochlorothiazide 25 MG tablet Take 25 mg by mouth daily.     Yes Historical Provider, MD  Magnesium Oxide 400 (241.3 MG) MG TABS 1 daily 04/30/11 04/29/12 Yes Clinton D Young, MD  predniSONE (DELTASONE) 10 MG tablet Take 6 tablets a day for 3 days, then 4 tablets a day until symptoms are better then taper back to original dose as directed   Yes Historical Provider, MD  simvastatin (ZOCOR) 10 MG tablet Take 5 mg by mouth at bedtime.    Yes Historical Provider, MD   Allergies Allergies  Allergen Reactions  . Augmentin     GI upset  . Daliresp (Roflumilast) Other (See Comments)    unknown  . Diltiazem Nausea Only  . Montelukast Sodium     REACTION: flu-like symptoms  . Zafirlukast Other (  See Comments)    unknown    Family History Family History  Problem Relation Age of Onset  . Asthma Mother   . Emphysema Father   . Alzheimer's disease Brother   . Colon cancer Other     sibling  . Heart disease Other     sibling    Social History  reports that she quit smoking about 22 years ago. She does not have any smokeless tobacco history on file. Her alcohol and drug histories not on file.  Review Of Systems  11 points review of systems is negative with an exception of listed in HPI.  Vital Signs: Temp:  [98.1 F (36.7 C)-101.5 F (38.6 C)] 101.5 F (38.6 C) (03/18 1121) Resp:  [23] 23  (03/18 1037) BP: (127)/(70) 127/70 mmHg (03/18 1037) SpO2:  [94 %-95 %] 95 % (03/18  1114)    Physical Examination: General:  WNWDnad at rest Neuro:  intact   HEENT: no  jvd Cardiovascular:  hsd Lungs:  Distant BS, diffuse exp wheezes, No findings of consolidation, No rales Abdomen:  +bs Extremities: No edema, DP pulses full Skin:  intact    Labs and Imaging:  Dg Chest Port 1 View  12/07/2011  *RADIOLOGY REPORT*  Clinical Data: Shortness of breath and cough.  COPD.  Aortic stenosis.  PORTABLE CHEST - 1 VIEW  Comparison: 09/08/2011.  Findings: There are stable changes of COPD with chronic bronchitis. There are no infiltrates or edematous changes.  The heart is normal in size.  There is no evidence for mediastinal or hilar adenopathy.  IMPRESSION: Changes of COPD.  No acute findings.  Original Report Authenticated By: Rolla Plate, M.D.    Lab 12/07/11 1204  NA 138  K 3.5  CL 99  CO2 --  BUN 21  CREATININE 0.90  GLUCOSE 154*    Lab 12/07/11 1204 12/07/11 1130  HGB 15.0 14.1  HCT 44.0 43.6  WBC -- 13.1*  PLT -- 206   ABG    Component Value Date/Time   TCO2 32 12/07/2011 1204    Assessment and Plan:  Acute on chronic resp Failure with underlying COPD with acute exacerbation.  -BD's -empiric abx -steroids IV -Admit to sdu -xopenex   HTN -hold antihypertensives x 24hours   Best practices / Disposition: -->SDU status under PCCM -->full code -->Heparin for DVT Px -->Protonix for GI Px -->diet as tolerated -->family updated at bedside  [facility] Minor ACNP Adolph Pollack PCCM Pager [phone] till 3 pm If no answer page [phone] 12/07/2011, 12:28 PM  Pt seen and examined and database reviewed. I agree with above findings, assessment and plan  Billy Fischer, MD;  PCCM service; Mobile [phone]

## 2011-12-07 NOTE — ED Notes (Signed)
Pt in from home, by ems. C/o cough, cob over last few days. Worse today. Given avelox by PCP last week. ems O2 RA 92%. 10albuterol, .05 atrovent given by ems. 125 mg solumedrol. 20g L AC.

## 2011-12-07 NOTE — ED Notes (Signed)
AVW:[id number]<XB> Expected date:12/07/11<BR> Expected time:10:01 AM<BR> Means of arrival:Ambulance<BR> Comments:<BR> SOB

## 2011-12-07 NOTE — ED Provider Notes (Addendum)
History     CSN: [id number]  Arrival date & time 12/07/11  1018   First MD Initiated Contact with Patient 12/07/11 1037      Chief Complaint  Patient presents with  . Shortness of Breath  . Cough    (Consider location/radiation/quality/duration/timing/severity/associated sxs/prior treatment) HPI Complains of cough and shortness of breath for past 2 weeks. Worsening over the past 2 or 3 days. Treated with a Z-Pak last week by Dr. Maple Hudson. Breathing became worse and this morning. EMS treated patient with albuterol 10 mg nebulized treatment and Solu-Medrol with partial improvement of breathing. Denies pain denies fever denies vomiting. Symptoms feel like COPD she's had in the past Past Medical History  Diagnosis Date  . Dermatitis   . GERD (gastroesophageal reflux disease)   . Depression   . Aortic stenosis     mild by echo (2008)  . Paroxysmal atrial fibrillation 11/08  . Allergic rhinitis   . Rheumatoid arthritis   . Hypertension   . Bronchitis, chronic obstructive, with exacerbation   . FHx: colon cancer   . Stasis dermatitis   . Multiple sclerosis   . COPD (chronic obstructive pulmonary disease)   . Asthma   . Acute urinary tract infection     Past Surgical History  Procedure Date  . Abdominal hysterectomy   . Breast cyst excision   . Lumbar disc surgery 2003    L5    Family History  Problem Relation Age of Onset  . Asthma Mother   . Emphysema Father   . Alzheimer's disease Brother   . Colon cancer Other     sibling  . Heart disease Other     sibling    History  Substance Use Topics  . Smoking status: Former Smoker    Quit date: 11/25/1989  . Smokeless tobacco: Not on file  . Alcohol Use: Not on file    OB History    Grav Para Term Preterm Abortions TAB SAB Ect Mult Living                  Review of Systems  Constitutional: Negative.   HENT: Negative.   Respiratory: Positive for cough, shortness of breath and wheezing.   Cardiovascular:  Negative.   Gastrointestinal: Negative.   Musculoskeletal: Negative.   Skin: Negative.   Neurological: Negative.   Hematological: Negative.   Psychiatric/Behavioral: Negative.   All other systems reviewed and are negative.    Allergies  Augmentin; Daliresp; Diltiazem; Montelukast sodium; and Zafirlukast  Home Medications   Current Outpatient Rx  Name Route Sig Dispense Refill  . ALBUTEROL SULFATE HFA 108 (90 BASE) MCG/ACT IN AERS Inhalation Inhale 2 puffs into the lungs every 6 (six) hours as needed for wheezing or shortness of breath. For shortness of breath 1 Inhaler prn  . ALPRAZOLAM 0.25 MG PO TABS Oral Take 1 tablet (0.25 mg total) by mouth at bedtime as needed. 30 tablet 5  . AMOXICILLIN-POT CLAVULANATE 875-125 MG PO TABS Oral Take 1 tablet by mouth 2 (two) times daily. 14 tablet 0  . ARFORMOTEROL TARTRATE 15 MCG/2ML IN NEBU Nebulization Take 15 mcg by nebulization 2 (two) times daily.      . ASPIRIN 81 MG PO TBEC Oral Take 1 tablet (81 mg total) by mouth daily. 30 tablet 0  . DM-GUAIFENESIN ER 60-1200 MG PO TB12 Oral Take 1 tablet by mouth every 12 (twelve) hours.      Marland Kitchen HYDROCHLOROTHIAZIDE 25 MG PO TABS Oral Take 25 mg  by mouth daily.      Marland Kitchen MAGNESIUM OXIDE 400 MG PO TABS  1 daily 20 tablet 0  . PREDNISONE 10 MG PO TABS  Take 6 tablets a day for 3 days, then 4 tablets a day until symptoms are better then taper back to original dose as directed    . SIMVASTATIN 10 MG PO TABS Oral Take 5 mg by mouth at bedtime.       BP 127/70  Temp(Src) 101.5 F (38.6 C) (Rectal)  Resp 23  SpO2 95%  Physical Exam  Constitutional:       Chronically ill-appearing  HENT:  Head: Normocephalic and atraumatic.  Eyes: Conjunctivae are normal. Pupils are equal, round, and reactive to light.  Neck: Neck supple. No tracheal deviation present. No thyromegaly present.  Cardiovascular: Normal rate and regular rhythm.   No murmur heard. Pulmonary/Chest: Breath sounds normal.       Speaks in  sentences prolonged expiratory phase with expiratory wheezes  Abdominal: Soft. Bowel sounds are normal. She exhibits no distension. There is no tenderness.  Musculoskeletal: Normal range of motion. She exhibits no edema and no tenderness.  Neurological: She is alert. Coordination normal.  Skin: Skin is warm and dry. No rash noted.  Psychiatric: She has a normal mood and affect.    ED Course  Procedures (including critical care time)   Labs Reviewed  CBC   Dg Chest Port 1 View  12/07/2011  *RADIOLOGY REPORT*  Clinical Data: Shortness of breath and cough.  COPD.  Aortic stenosis.  PORTABLE CHEST - 1 VIEW  Comparison: 09/08/2011.  Findings: There are stable changes of COPD with chronic bronchitis. There are no infiltrates or edematous changes.  The heart is normal in size.  There is no evidence for mediastinal or hilar adenopathy.  IMPRESSION: Changes of COPD.  No acute findings.  Original Report Authenticated By: Rolla Plate, M.D.     No diagnosis found.  1 PM breathing much improved  Dr.Simmonds called by me came to evaluate patient for admission Results for orders placed during the hospital encounter of 12/07/11  CBC      Component Value Range   WBC 13.1 (*) 4.0 - 10.5 (K/uL)   RBC 4.79  3.87 - 5.11 (MIL/uL)   Hemoglobin 14.1  12.0 - 15.0 (g/dL)   HCT 91.4  78.2 - 95.6 (%)   MCV 91.0  78.0 - 100.0 (fL)   MCH 29.4  26.0 - 34.0 (pg)   MCHC 32.3  30.0 - 36.0 (g/dL)   RDW 21.3  08.6 - 57.8 (%)   Platelets 206  150 - 400 (K/uL)  POCT I-STAT, CHEM 8      Component Value Range   Sodium 138  135 - 145 (mEq/L)   Potassium 3.5  3.5 - 5.1 (mEq/L)   Chloride 99  96 - 112 (mEq/L)   BUN 21  6 - 23 (mg/dL)   Creatinine, Ser 4.69  0.50 - 1.10 (mg/dL)   Glucose, Bld 629 (*) 70 - 99 (mg/dL)   Calcium, Ion 5.28 (*) 1.12 - 1.32 (mmol/L)   TCO2 32  0 - 100 (mmol/L)   Hemoglobin 15.0  12.0 - 15.0 (g/dL)   HCT 41.3  24.4 - 01.0 (%)   Dg Chest Port 1 View  12/07/2011  *RADIOLOGY  REPORT*  Clinical Data: Shortness of breath and cough.  COPD.  Aortic stenosis.  PORTABLE CHEST - 1 VIEW  Comparison: 09/08/2011.  Findings: There are stable changes of COPD with  chronic bronchitis. There are no infiltrates or edematous changes.  The heart is normal in size.  There is no evidence for mediastinal or hilar adenopathy.  IMPRESSION: Changes of COPD.  No acute findings.  Original Report Authenticated By: Rolla Plate, M.D.    MDM  Plan admit step down unit Diagnoses #1exacerbation COPD #2 hyperglycemia    CRITICAL CARE Performed by: Doug Sou   Total critical care time: 30 minutes  Critical care time was exclusive of separately billable procedures and treating other patients.  Critical care was necessary to treat or prevent imminent or life-threatening deterioration.  Critical care was time spent personally by me on the following activities: development of treatment plan with patient and/or surrogate as well as nursing, discussions with consultants, evaluation of patient's response to treatment, examination of patient, obtaining history from patient or surrogate, ordering and performing treatments and interventions, ordering and review of laboratory studies, ordering and review of radiographic studies, pulse oximetry and re-evaluation of patient's condition.    Doug Sou, MD 12/07/11 1310  Doug Sou, MD 12/07/11 1311

## 2011-12-07 NOTE — Telephone Encounter (Signed)
ATC pt but NA will forward to CDY as an [place]

## 2011-12-07 NOTE — Telephone Encounter (Signed)
This has been noted by CY. Will sign off.

## 2011-12-07 NOTE — Progress Notes (Signed)
ANTIBIOTIC CONSULT NOTE - INITIAL  Pharmacy Consult for Avelox Indication: COPD exacerbation  Allergies  Allergen Reactions  . Augmentin     GI upset  . Daliresp (Roflumilast) Other (See Comments)    unknown  . Diltiazem Nausea Only  . Montelukast Sodium     REACTION: flu-like symptoms  . Zafirlukast Other (See Comments)    unknown    Vital Signs: Temp: 101.5 F (38.6 C) (03/18 1121) Temp src: Rectal (03/18 1121) BP: 127/70 mmHg (03/18 1037)   Labs:  Basename 12/07/11 1204 12/07/11 1130  WBC -- 13.1*  HGB 15.0 14.1  PLT -- 206  LABCREA -- --  CREATININE 0.90 --   CrCl = 60 ml/min (normalized)    Microbiology: 3/18 urine strep Ag: 3/18 urine [place] Ag  Medical History: Past Medical History  Diagnosis Date  . Dermatitis   . GERD (gastroesophageal reflux disease)   . Depression   . Aortic stenosis     mild by echo (2008)  . Paroxysmal atrial fibrillation 11/08  . Allergic rhinitis   . Rheumatoid arthritis   . Hypertension   . Bronchitis, chronic obstructive, with exacerbation   . FHx: colon cancer   . Stasis dermatitis   . Multiple sclerosis   . COPD (chronic obstructive pulmonary disease)   . Asthma   . Acute urinary tract infection     Medications:  Scheduled:    . acetaminophen  650 mg Oral Once  . albuterol  10 mg Nebulization Once  . albuterol      . budesonide  0.25 mg Nebulization Q6H  . enoxaparin  40 mg Subcutaneous Q24H  . ipratropium      . ipratropium  0.5 mg Nebulization Q6H  . ipratropium  1 mg Nebulization Once  . levalbuterol  0.63 mg Nebulization Q6H  . methylPREDNISolone (SOLU-MEDROL) injection  60 mg Intravenous Q12H  . methylPREDNISolone sodium succinate      . moxifloxacin  400 mg Intravenous Q24H  . pantoprazole (PROTONIX) IV  40 mg Intravenous QHS   Assessment: 78 YOF with COPD presents to ER with 4-5 day history of increased SOB & fevers, not responding to outpatient po antibiotics (Augmentin).  Goal of  Therapy:  Eradication of infection  Plan:   Avelox 400mg  IV q24h  No renal adjustment necessary  Change to po as soon as appropriate  Loralee Pacas, PharmD, BCPS Pager: [phone] 12/07/2011,1:21 PM

## 2011-12-08 ENCOUNTER — Inpatient Hospital Stay (HOSPITAL_COMMUNITY): Payer: Medicare Other

## 2011-12-08 ENCOUNTER — Encounter (HOSPITAL_COMMUNITY): Payer: Self-pay

## 2011-12-08 DIAGNOSIS — J9620 Acute and chronic respiratory failure, unspecified whether with hypoxia or hypercapnia: Secondary | ICD-10-CM | POA: Diagnosis not present

## 2011-12-08 DIAGNOSIS — R918 Other nonspecific abnormal finding of lung field: Secondary | ICD-10-CM | POA: Diagnosis not present

## 2011-12-08 DIAGNOSIS — R000 Tachycardia, unspecified: Secondary | ICD-10-CM

## 2011-12-08 DIAGNOSIS — J441 Chronic obstructive pulmonary disease with (acute) exacerbation: Secondary | ICD-10-CM | POA: Diagnosis not present

## 2011-12-08 DIAGNOSIS — J449 Chronic obstructive pulmonary disease, unspecified: Secondary | ICD-10-CM | POA: Diagnosis not present

## 2011-12-08 DIAGNOSIS — J438 Other emphysema: Secondary | ICD-10-CM | POA: Diagnosis not present

## 2011-12-08 LAB — STREP PNEUMONIAE URINARY ANTIGEN: Strep Pneumo Urinary Antigen: NEGATIVE

## 2011-12-08 LAB — BASIC METABOLIC PANEL
BUN: 15 mg/dL (ref 6–23)
CO2: 31 mEq/L (ref 19–32)
Chloride: 99 mEq/L (ref 96–112)
Creatinine, Ser: 0.56 mg/dL (ref 0.50–1.10)
GFR calc Af Amer: >90 mL/min (ref >90)
Potassium: 3.8 mEq/L (ref 3.5–5.1)

## 2011-12-08 LAB — CBC
HCT: 39.9 % (ref 36.0–46.0)
MCV: 90.3 fL (ref 78.0–100.0)
Platelets: 197 10*3/uL (ref 150–400)
RBC: 4.42 MIL/uL (ref 3.87–5.11)
WBC: 9.3 10*3/uL (ref 4.0–10.5)

## 2011-12-08 LAB — CARDIAC PANEL(CRET KIN+CKTOT+MB+TROPI)
Relative Index: INVALID (ref 0.0–2.5)
Troponin I: <0.3 ng/mL (ref <0.30)

## 2011-12-08 LAB — LEGIONELLA ANTIGEN, URINE: Legionella Antigen, Urine: NEGATIVE

## 2011-12-08 MED ORDER — PANTOPRAZOLE SODIUM 40 MG PO TBEC
40.0000 mg | DELAYED_RELEASE_TABLET | Freq: Every day | ORAL | Status: DC
  Administered 2011-12-08 – 2011-12-14 (×6): 40 mg via ORAL
  Filled 2011-12-08 (×9): qty 1

## 2011-12-08 MED ORDER — MOXIFLOXACIN HCL 400 MG PO TABS
400.0000 mg | ORAL_TABLET | Freq: Every day | ORAL | Status: DC
  Administered 2011-12-08 – 2011-12-14 (×7): 400 mg via ORAL
  Filled 2011-12-08 (×7): qty 1

## 2011-12-08 MED ORDER — PANTOPRAZOLE SODIUM 40 MG IV SOLR
40.0000 mg | Freq: Two times a day (BID) | INTRAVENOUS | Status: DC
  Administered 2011-12-08: 40 mg via INTRAVENOUS

## 2011-12-08 MED ORDER — HYDROCHLOROTHIAZIDE 25 MG PO TABS
25.0000 mg | ORAL_TABLET | Freq: Every day | ORAL | Status: DC
  Administered 2011-12-08 – 2011-12-14 (×7): 25 mg via ORAL
  Filled 2011-12-08 (×7): qty 1

## 2011-12-08 MED ORDER — POTASSIUM CHLORIDE CRYS ER 20 MEQ PO TBCR
40.0000 meq | EXTENDED_RELEASE_TABLET | Freq: Every day | ORAL | Status: DC
  Administered 2011-12-08 – 2011-12-14 (×7): 40 meq via ORAL
  Filled 2011-12-08 (×7): qty 2

## 2011-12-08 NOTE — Progress Notes (Signed)
CARE MANAGEMENT NOTE 12/08/2011  Patient:  AERIN, DELANY   Account Number:  [id number]  Date Initiated:  12/08/2011  Documentation initiated by:  Cobi Aldape  Subjective/Objective Assessment:   patient with excerbation of copd and failed outpt treatments     Action/Plan:   lives at home   Anticipated DC Date:  12/11/2011   Anticipated DC Plan:  HOME/SELF CARE  In-house referral  NA      DC Planning Services  NA      [facility] Choice  NA   Choice offered to / List presented to:  NA   DME arranged  NA      DME agency  NA     HH arranged  NA      HH agency  NA   Status of service:  In process, will continue to follow Medicare Important Message given?  NA - LOS <3 / Initial given by admissions (If response is "NO", the following Medicare IM given date fields will be blank) Date Medicare IM given:   Date Additional Medicare IM given:    Discharge Disposition:    Per UR Regulation:  Reviewed for med. necessity/level of care/duration of stay  If discussed at Long Length of Stay Meetings, dates discussed:    Comments:  [id number]/Ticara Waner,RN,BSN,CCM

## 2011-12-08 NOTE — Progress Notes (Signed)
Name: [name] MRN: [id number] DOB: [birth year]    LOS: 1 Requesting MD:: Ethelda Chick, S Pulmonary: Young  PCCM FOLLOW UP NOTE  PT PROFILE: CY patient with COPD - moderate at baseline, admitted 3/18 with AECOPD, sinus tachycardia     Lines / Drains: None  Cultures:   Antibiotics: 3/18 avelox (AECOPD) >>  Tests / Events:   Subjective: NAD at rest. Feels better  Vital Signs: Temp:  [97.6 F (36.4 C)-101.5 F (38.6 C)] 98 F (36.7 C) (03/19 0400) Pulse Rate:  [99-131] 118  (03/19 0800) Resp:  [17-25] 25  (03/19 0800) BP: (107-145)/(53-98) 133/86 mmHg (03/19 0400) SpO2:  [94 %-99 %] 94 % (03/19 0800) Weight:  [132 lb 15 oz (60.3 kg)] 132 lb 15 oz (60.3 kg) (03/18 2004) 2l [place] I/O last 3 completed shifts: In: 600 [I.V.:600] Out: 350 [Urine:350]  Physical Examination: General:  WNWD nad at rest Neuro:  intact   HEENT: no  jvd Cardiovascular:  hsd st Lungs:  Distant BS, diffuse exp wheezes, No findings of consolidation, No rales Abdomen:  +bs Extremities: No edema, DP pulses full Skin:  intact    Labs and Imaging:  Portable Chest Xray In Am  12/08/2011  *RADIOLOGY REPORT*  Clinical Data: COPD  PORTABLE CHEST - 1 VIEW  Comparison: 12/07/2011; 09/08/2011; 04/21/2011; chest CT - 09/08/2011  Findings: Grossly unchanged cardiac silhouette and mediastinal contours with mild tortuosity of the descending thoracic aorta. The lungs remain hyperinflated with flattening of the bilateral hemidiaphragms.  There is unchanged mild diffuse bilateral thickening of the pulmonary interstitium.  No new focal parenchymal opacities.  No definite pleural effusion or pneumothorax.  Grossly unchanged bones.  IMPRESSION: Chronic emphysematous change without definite superimposed acute cardiopulmonary disease.  Original Report Authenticated By: Waynard Reeds, M.D.   Dg Chest Port 1 View  12/07/2011  *RADIOLOGY REPORT*  Clinical Data: Shortness of breath and cough.  COPD.  Aortic  stenosis.  PORTABLE CHEST - 1 VIEW  Comparison: 09/08/2011.  Findings: There are stable changes of COPD with chronic bronchitis. There are no infiltrates or edematous changes.  The heart is normal in size.  There is no evidence for mediastinal or hilar adenopathy.  IMPRESSION: Changes of COPD.  No acute findings.  Original Report Authenticated By: Rolla Plate, M.D.    Lab 12/07/11 1725 12/07/11 1204  NA 134* 138  K 3.6 3.5  CL 94* 99  CO2 27 --  BUN 17 21  CREATININE 0.68 0.90  GLUCOSE 311* 154*    Lab 12/07/11 1725 12/07/11 1204 12/07/11 1130  HGB 13.6 15.0 14.1  HCT 41.9 44.0 43.6  WBC 6.2 -- 13.1*  PLT 187 -- 206     Assessment and Plan:  Acute on chronic resp Failure with underlying COPD with acute exacerbation.  -Cont BD's, abx, steroids, (xopenex due to hx of paf and tachycardia)  HTN -hold antihypertensives x 24hours 3/18 -resume hctz 3/19   Best practices / Disposition: -->Transfer to Tele bed -->full code -->Heparin for DVT Px -->Protonix for SUP bid due to gerd dx -->diet as tolerated   [name] ACNP [name] PCCM Pager [phone] till 3 pm If no answer page [phone] 12/08/2011, 8:34 AM  Pt seen and examined and database reviewed. I agree with above findings, assessment and plan  Rachael Fischer, MD;  PCCM service; Mobile [phone]

## 2011-12-09 ENCOUNTER — Other Ambulatory Visit: Payer: Self-pay

## 2011-12-09 ENCOUNTER — Inpatient Hospital Stay (HOSPITAL_COMMUNITY): Payer: Medicare Other

## 2011-12-09 DIAGNOSIS — J441 Chronic obstructive pulmonary disease with (acute) exacerbation: Secondary | ICD-10-CM | POA: Diagnosis not present

## 2011-12-09 DIAGNOSIS — J438 Other emphysema: Secondary | ICD-10-CM | POA: Diagnosis not present

## 2011-12-09 DIAGNOSIS — J449 Chronic obstructive pulmonary disease, unspecified: Secondary | ICD-10-CM | POA: Diagnosis not present

## 2011-12-09 DIAGNOSIS — R000 Tachycardia, unspecified: Secondary | ICD-10-CM | POA: Diagnosis not present

## 2011-12-09 DIAGNOSIS — I369 Nonrheumatic tricuspid valve disorder, unspecified: Secondary | ICD-10-CM

## 2011-12-09 DIAGNOSIS — J9620 Acute and chronic respiratory failure, unspecified whether with hypoxia or hypercapnia: Secondary | ICD-10-CM | POA: Diagnosis not present

## 2011-12-09 LAB — BASIC METABOLIC PANEL
CO2: 34 mEq/L — ABNORMAL HIGH (ref 19–32)
Chloride: 100 mEq/L (ref 96–112)
Glucose, Bld: 166 mg/dL — ABNORMAL HIGH (ref 70–99)
Potassium: 4.7 mEq/L (ref 3.5–5.1)
Sodium: 139 mEq/L (ref 135–145)

## 2011-12-09 LAB — CARDIAC PANEL(CRET KIN+CKTOT+MB+TROPI)
CK, MB: 5.6 ng/mL — ABNORMAL HIGH (ref 0.3–4.0)
CK, MB: 5.2 ng/mL — ABNORMAL HIGH (ref 0.3–4.0)
Relative Index: INVALID (ref 0.0–2.5)
Total CK: 52 U/L (ref 7–177)
Troponin I: <0.3 ng/mL (ref <0.30)

## 2011-12-09 MED ORDER — SIMVASTATIN 5 MG PO TABS
5.0000 mg | ORAL_TABLET | Freq: Every day | ORAL | Status: DC
  Administered 2011-12-09 – 2011-12-13 (×5): 5 mg via ORAL
  Filled 2011-12-09 (×6): qty 1

## 2011-12-09 MED ORDER — ALPRAZOLAM 0.25 MG PO TABS
0.2500 mg | ORAL_TABLET | Freq: Two times a day (BID) | ORAL | Status: DC
  Administered 2011-12-09 – 2011-12-11 (×5): 0.25 mg via ORAL
  Filled 2011-12-09 (×4): qty 1

## 2011-12-09 NOTE — Progress Notes (Signed)
Name: [name] MRN: [id number] DOB: 07-31-1933    LOS: 2 Requesting MD:: Ethelda Chick, S Pulmonary: Young  PCCM FOLLOW UP NOTE  PT PROFILE: CY patient with COPD - moderate at baseline, admitted 3/18 with AECOPD, sinus tachycardia     Lines / Drains: None  Cultures:   Antibiotics: 3/18 avelox (AECOPD) >>  Tests / Events: 3/20 increased wob , tx to floor canceled.  Subjective: Increase wob.  Vital Signs: Temp:  [97.4 F (36.3 C)-98 F (36.7 C)] 97.7 F (36.5 C) (03/20 0800) Pulse Rate:  [79-139] 79  (03/20 0100) Resp:  [15-33] 15  (03/20 0100) BP: (152-161)/(79-107) 160/79 mmHg (03/20 0100) SpO2:  [91 %-100 %] 100 % (03/20 0831) 2l [place] I/O last 3 completed shifts: In: 650 [I.V.:650] Out: 1701 [Urine:1700; Stool:1]  Physical Examination: General:  WNWD anxious Neuro:  intact   HEENT: no  jvd Cardiovascular:  hsd st Lungs:  Distant BS, diffuse exp wheezes, No findings of consolidation, No rales Abdomen:  +bs Extremities: No edema, DP pulses full Skin:  intact    Labs and Imaging:  Dg Chest Port 1 View  12/09/2011  *RADIOLOGY REPORT*  Clinical Data: COPD  PORTABLE CHEST - 1 VIEW  Comparison: Chest radiograph 12/08/2011  Findings: Stable normal cardiac silhouette.  There is mild bronchitic change centrally.  Lungs are mildly hyper.  No effusion, infiltrate, or pneumothorax.  IMPRESSION: Mild bronchitic change.  No acute findings.  Original Report Authenticated By: Genevive Bi, M.D.   Portable Chest Xray In Am  12/08/2011  *RADIOLOGY REPORT*  Clinical Data: COPD  PORTABLE CHEST - 1 VIEW  Comparison: 12/07/2011; 09/08/2011; 04/21/2011; chest CT - 09/08/2011  Findings: Grossly unchanged cardiac silhouette and mediastinal contours with mild tortuosity of the descending thoracic aorta. The lungs remain hyperinflated with flattening of the bilateral hemidiaphragms.  There is unchanged mild diffuse bilateral thickening of the pulmonary interstitium.  No new focal  parenchymal opacities.  No definite pleural effusion or pneumothorax.  Grossly unchanged bones.  IMPRESSION: Chronic emphysematous change without definite superimposed acute cardiopulmonary disease.  Original Report Authenticated By: Waynard Reeds, M.D.   Dg Chest Port 1 View  12/07/2011  *RADIOLOGY REPORT*  Clinical Data: Shortness of breath and cough.  COPD.  Aortic stenosis.  PORTABLE CHEST - 1 VIEW  Comparison: 09/08/2011.  Findings: There are stable changes of COPD with chronic bronchitis. There are no infiltrates or edematous changes.  The heart is normal in size.  There is no evidence for mediastinal or hilar adenopathy.  IMPRESSION: Changes of COPD.  No acute findings.  Original Report Authenticated By: Rolla Plate, M.D.    Lab 12/09/11 0340 12/08/11 0820 12/07/11 1725  NA 139 138 134*  K 4.7 3.8 3.6  CL 100 99 94*  CO2 34* 31 27  BUN 17 15 17   CREATININE 0.63 0.56 0.68  GLUCOSE 166* 266* 311*    Lab 12/08/11 0820 12/07/11 1725 12/07/11 1204 12/07/11 1130  HGB 13.0 13.6 15.0 --  HCT 39.9 41.9 44.0 --  WBC 9.3 6.2 -- 13.1*  PLT 197 187 -- 206     Assessment and Plan:  Acute on chronic resp Failure with underlying COPD with acute exacerbation.  -Cont BD's, abx, steroids, (xopenex due to hx of paf and tachycardia) -3/20 remain in sdu. -add bid xanax -address code status with family.  HTN -hold antihypertensives x 24hours 3/18 -resume hctz 3/19   Best practices / Disposition: -->Transfer to Tele bed -->full code -->Heparin for DVT Px -->  Protonix for SUP bid due to gerd dx -->diet as tolerated   Brett Canales Minor ACNP Adolph Pollack PCCM Pager [phone] till 3 pm If no answer page [phone] 12/09/2011, 8:48 AM  Pt seen and examined and database reviewed. I agree with above findings, assessment and plan  Billy Fischer, MD;  PCCM service; Mobile [phone]

## 2011-12-10 ENCOUNTER — Inpatient Hospital Stay (HOSPITAL_COMMUNITY): Payer: Medicare Other

## 2011-12-10 DIAGNOSIS — R000 Tachycardia, unspecified: Secondary | ICD-10-CM

## 2011-12-10 DIAGNOSIS — J449 Chronic obstructive pulmonary disease, unspecified: Secondary | ICD-10-CM | POA: Diagnosis not present

## 2011-12-10 DIAGNOSIS — J438 Other emphysema: Secondary | ICD-10-CM

## 2011-12-10 DIAGNOSIS — J9620 Acute and chronic respiratory failure, unspecified whether with hypoxia or hypercapnia: Secondary | ICD-10-CM

## 2011-12-10 DIAGNOSIS — J441 Chronic obstructive pulmonary disease with (acute) exacerbation: Secondary | ICD-10-CM

## 2011-12-10 LAB — BASIC METABOLIC PANEL
CO2: 41 mEq/L (ref 19–32)
Chloride: 96 mEq/L (ref 96–112)
Creatinine, Ser: 0.66 mg/dL (ref 0.50–1.10)

## 2011-12-10 LAB — PRO B NATRIURETIC PEPTIDE: Pro B Natriuretic peptide (BNP): 519 pg/mL — ABNORMAL HIGH (ref 0–450)

## 2011-12-10 LAB — CBC
MCV: 92.1 fL (ref 78.0–100.0)
Platelets: 211 10*3/uL (ref 150–400)
RBC: 4.91 MIL/uL (ref 3.87–5.11)
WBC: 9.6 10*3/uL (ref 4.0–10.5)

## 2011-12-10 LAB — CARDIAC PANEL(CRET KIN+CKTOT+MB+TROPI): CK, MB: 4.3 ng/mL — ABNORMAL HIGH (ref 0.3–4.0)

## 2011-12-10 MED ORDER — CHLORHEXIDINE GLUCONATE CLOTH 2 % EX PADS
6.0000 | MEDICATED_PAD | Freq: Every day | CUTANEOUS | Status: AC
  Administered 2011-12-10 – 2011-12-11 (×2): 6 via TOPICAL

## 2011-12-10 MED ORDER — METHYLPREDNISOLONE SODIUM SUCC 40 MG IJ SOLR
40.0000 mg | Freq: Two times a day (BID) | INTRAMUSCULAR | Status: DC
  Administered 2011-12-10 – 2011-12-11 (×2): 40 mg via INTRAVENOUS
  Filled 2011-12-10 (×4): qty 1

## 2011-12-10 NOTE — Progress Notes (Signed)
Name: [name] MRN: [id number] DOB: [birth year]    LOS: 3 Requesting MD:: Ethelda Chick, S Pulmonary: Young  PCCM FOLLOW UP NOTE  PT PROFILE: CY patient with COPD - moderate at baseline, admitted 3/18 with AECOPD, sinus tachycardia     Lines / Drains: None  Cultures:   Antibiotics: 3/18 avelox (AECOPD) >>  Tests / Events: 3/20 increased wob , tx to floor canceled. 3/21 better with xanax  Subjective: Increase wob.  Vital Signs: Temp:  [97.6 F (36.4 C)-99.1 F (37.3 C)] 98.2 F (36.8 C) (03/21 0400) Pulse Rate:  [51-135] 101  (03/21 0825) Resp:  [14-29] 20  (03/21 0825) BP: (121-141)/(62-98) 121/89 mmHg (03/21 0825) SpO2:  [91 %-98 %] 96 % (03/21 0825) Weight:  [129 lb 3 oz (58.6 kg)] 129 lb 3 oz (58.6 kg) (03/21 0400) 2l [place] I/O last 3 completed shifts: In: 480 [P.O.:480] Out: 650 [Urine:650]  Physical Examination: General:  WNWD  Less anxious Neuro:  intact   HEENT: no  jvd Cardiovascular:  hsd st Lungs:  Distant BS,  No findings of consolidation, No rales Abdomen:  +bs Extremities: No edema, DP pulses full Skin:  intact    Labs and Imaging:  Dg Chest Port 1 View  12/10/2011  *RADIOLOGY REPORT*  Clinical Data: COPD.  PORTABLE CHEST - 1 VIEW  Comparison: Chest x-ray 12/09/2011.  Findings: Lungs appear mildly hyperexpanded, with some pruning of the pulmonary vasculature in the periphery, suggesting underlying emphysema.  No definite focal airspace consolidation.  No pleural effusions.  No evidence of edema.  Heart size is normal. Mediastinal contours are unremarkable.  Atherosclerotic calcifications within the arch of the aorta.  IMPRESSION: 1.  And hyperinflation of the lungs with overall appearance suggestive of underlying COPD, as above. 2.  No definite radiographic evidence of acute cardiopulmonary disease. 3.  Atherosclerosis.  Original Report Authenticated By: Florencia Reasons, M.D.   Dg Chest Port 1 View  12/09/2011  *RADIOLOGY REPORT*  Clinical  Data: COPD  PORTABLE CHEST - 1 VIEW  Comparison: Chest radiograph 12/08/2011  Findings: Stable normal cardiac silhouette.  There is mild bronchitic change centrally.  Lungs are mildly hyper.  No effusion, infiltrate, or pneumothorax.  IMPRESSION: Mild bronchitic change.  No acute findings.  Original Report Authenticated By: Genevive Bi, M.D.    Lab 12/10/11 0155 12/09/11 0340 12/08/11 0820  NA 140 139 138  K 4.9 4.7 3.8  CL 96 100 99  CO2 41* 34* 31  BUN 23 17 15   CREATININE 0.66 0.63 0.56  GLUCOSE 150* 166* 266*    Lab 12/10/11 0155 12/08/11 0820 12/07/11 1725  HGB 14.4 13.0 13.6  HCT 45.2 39.9 41.9  WBC 9.6 9.3 6.2  PLT 211 197 187     Assessment and Plan:  Acute on chronic resp Failure with underlying COPD with acute exacerbation.  -Cont BD's, abx, steroids, (xopenex due to hx of paf and tachycardia) -3/20 remain in sdu. -add bid xanax 3/20   HTN -hold antihypertensives x 24hours 3/18 -resume hctz 3/19   Best practices / Disposition: -->Transfer to med surg bed 3/21 -->full code -->Heparin for DVT Px -->Protonix for SUP bid due to gerd dx -->diet as tolerated   Brett Canales Minor ACNP [facility] PCCM Pager [phone] till 3 pm If no answer page [phone] 12/10/2011, 9:18 AM    Pt seen and examined and database reviewed. I agree with above findings, assessment and plan. Much improved today. Transfer to reg bed.  Billy Fischer, MD;  PCCM  service; Mobile [phone]

## 2011-12-10 NOTE — Progress Notes (Signed)
CRITICAL VALUE ALERT  Critical value received:  CO2 - 41  Date of notification:  12/10/11  Time of notification:  03:18  Critical value read back: yes  Nurse who received alert:  Haskell Flirt, RN  MD notified (1st page):  Dr. Darrick Penna  Time of first page:  03:19  MD notified (2nd page):  Time of second page:  Responding MD:  Dr. Darrick Penna  Time MD responded:  03:19

## 2011-12-11 DIAGNOSIS — J441 Chronic obstructive pulmonary disease with (acute) exacerbation: Secondary | ICD-10-CM | POA: Diagnosis not present

## 2011-12-11 DIAGNOSIS — R0602 Shortness of breath: Secondary | ICD-10-CM | POA: Diagnosis not present

## 2011-12-11 LAB — BASIC METABOLIC PANEL
BUN: 27 mg/dL — ABNORMAL HIGH (ref 6–23)
Chloride: 95 mEq/L — ABNORMAL LOW (ref 96–112)
Creatinine, Ser: 0.71 mg/dL (ref 0.50–1.10)
GFR calc Af Amer: >90 mL/min (ref >90)

## 2011-12-11 MED ORDER — PREDNISONE 20 MG PO TABS
40.0000 mg | ORAL_TABLET | Freq: Every day | ORAL | Status: DC
  Administered 2011-12-12 – 2011-12-13 (×2): 40 mg via ORAL
  Filled 2011-12-11 (×2): qty 2

## 2011-12-11 MED ORDER — SENNA 8.6 MG PO TABS
2.0000 | ORAL_TABLET | Freq: Every day | ORAL | Status: DC
  Administered 2011-12-11 – 2011-12-12 (×2): 17.2 mg via ORAL
  Filled 2011-12-11 (×2): qty 2

## 2011-12-11 MED ORDER — MORPHINE SULFATE 10 MG/5ML PO SOLN
2.5000 mg | Freq: Once | ORAL | Status: AC
  Administered 2011-12-11: 2.5 mg via ORAL

## 2011-12-11 MED ORDER — MORPHINE SULFATE 10 MG/5ML PO SOLN
2.5000 mg | Freq: Three times a day (TID) | ORAL | Status: DC
  Administered 2011-12-11: 22:00:00 via ORAL
  Administered 2011-12-12 – 2011-12-14 (×6): 2.5 mg via ORAL
  Filled 2011-12-11 (×7): qty 5

## 2011-12-11 MED ORDER — ALPRAZOLAM 0.25 MG PO TABS
0.2500 mg | ORAL_TABLET | Freq: Every day | ORAL | Status: DC
  Administered 2011-12-12 – 2011-12-14 (×3): 0.25 mg via ORAL
  Filled 2011-12-11 (×2): qty 1

## 2011-12-11 NOTE — Progress Notes (Addendum)
Name: [name] MRN: [id number] DOB: May 14, 1933    LOS: 4 Requesting MD:: Ethelda Chick, S Pulmonary: Young  PCCM FOLLOW UP NOTE  PT PROFILE: CY patient with COPD - moderate at baseline, admitted 3/18 with AECOPD, sinus tachycardia. At baseline takes 1/2 xanax daily for "nerves"     Lines / Drains: None  Cultures:   Antibiotics: 3/18 avelox (AECOPD) >> 3/18 Solumedrol >.3/22 Prednisone >>  Tests / Events: 3/20 increased wob , tx to floor canceled. 3/21 better with xanax  Subjective: Feels only 50% of baseline. Main complaint is fatigue and deconditioning and feeling worn out. Feels only 50% of baseline. DYspnea is much improved  Vital Signs: Temp:  [97.8 F (36.6 C)-98.8 F (37.1 C)] 98 F (36.7 C) (03/22 0940) Pulse Rate:  [106-125] 113  (03/22 0940) Resp:  [18-27] 18  (03/22 0940) BP: (123-155)/(75-95) 126/80 mmHg (03/22 0940) SpO2:  [93 %-98 %] 98 % (03/22 0940) Weight:  [58.3 kg (128 lb 8.5 oz)] 58.3 kg (128 lb 8.5 oz) (03/21 1942) 2l [place] I/O last 3 completed shifts: In: 1320 [P.O.:1320] Out: 100 [Urine:100]  Physical Examination: General:  WNWD   Neuro:  intact   HEENT: no  jvd Cardiovascular:  hsd st Lungs:  Distant BS,  No findings of consolidation, No rales/ No wheeze. Appears to have class 4 dyspnea Abdomen:  +bs Extremities: No edema, DP pulses full Skin:  intact    Labs and Imaging:  Dg Chest Port 1 View  12/10/2011  *RADIOLOGY REPORT*  Clinical Data: COPD.  PORTABLE CHEST - 1 VIEW  Comparison: Chest x-ray 12/09/2011.  Findings: Lungs appear mildly hyperexpanded, with some pruning of the pulmonary vasculature in the periphery, suggesting underlying emphysema.  No definite focal airspace consolidation.  No pleural effusions.  No evidence of edema.  Heart size is normal. Mediastinal contours are unremarkable.  Atherosclerotic calcifications within the arch of the aorta.  IMPRESSION: 1.  And hyperinflation of the lungs with overall appearance  suggestive of underlying COPD, as above. 2.  No definite radiographic evidence of acute cardiopulmonary disease. 3.  Atherosclerosis.  Original Report Authenticated By: Florencia Reasons, M.D.    Lab 12/11/11 0455 12/10/11 0155 12/09/11 0340  NA 135 140 139  K 4.7 4.9 4.7  CL 95* 96 100  CO2 39* 41* 34*  BUN 27* 23 17  CREATININE 0.71 0.66 0.63  GLUCOSE 190* 150* 166*    Lab 12/10/11 0155 12/08/11 0820 12/07/11 1725  HGB 14.4 13.0 13.6  HCT 45.2 39.9 41.9  WBC 9.6 9.3 6.2  PLT 211 197 187     Assessment and Plan:  Acute on chronic resp Failure with underlying COPD with acute exacerbation.  -Cont BD's, abx, steroids, (xopenex due to hx of paf and tachycardia) -3/20 remain in sdu. -add bid xanax 3/20 - try test dose oral morphine 12/11/2011 (NOTE OPIOIDS SUPERIOR TO ANXIOLYTICS FOR REFRACTORY DYSPNEA): MD to be paged with response of dyspnea - consider dc xanax if morphine helpful - change solumedrol to prednisone 12/11/2011  ADDNEDUM 12/11/2011 at 5:47 PM  - She says morphine 2.5mg  oral helped her immensely with dyspnea  More than xanax. No adverse effects. Will start low dose morphine syrup 2.5mg  Q8h scheduled with RN offering and she given right to refuse. I counseled patient on this.  Will cut down xanax to home dose. Add senna due to morphine. CCM MD followup to decide on de/up escalation of morphine based on symptoms   HTN -hold antihypertensives x 24hours 3/18 -  resume hctz 3/19  DECONDITIONEd  - PT/OT consult  - Intpaitent rehab consult  Best practices / Disposition: -->Floor status 3/21 onwards -->full code -->Heparin for DVT Px -->Protonix for SUP bid due to gerd dx -->diet as tolerated  Dr. Kalman Shan, M.D., [facility].C.P Pulmonary and Critical Care Medicine Staff Physician [facility] Pager: [phone], If no answer or between  15:00h - 7:00h: call [phone]  12/11/2011 12:51 PM

## 2011-12-11 NOTE — Progress Notes (Signed)
Chart review next review due [id number]

## 2011-12-12 DIAGNOSIS — J441 Chronic obstructive pulmonary disease with (acute) exacerbation: Secondary | ICD-10-CM | POA: Diagnosis not present

## 2011-12-12 DIAGNOSIS — J9620 Acute and chronic respiratory failure, unspecified whether with hypoxia or hypercapnia: Secondary | ICD-10-CM | POA: Diagnosis not present

## 2011-12-12 DIAGNOSIS — J438 Other emphysema: Secondary | ICD-10-CM | POA: Diagnosis not present

## 2011-12-12 MED ORDER — HYDROXYZINE HCL 10 MG PO TABS
10.0000 mg | ORAL_TABLET | Freq: Three times a day (TID) | ORAL | Status: DC | PRN
  Administered 2011-12-12: 10 mg via ORAL
  Filled 2011-12-12: qty 1

## 2011-12-12 NOTE — Progress Notes (Signed)
Subjective: Had done better with low dose mso4.  No increased wob and appears settled.  Still having some mild tachy, but regular.  Objective: Vital signs in last 24 hours: Blood pressure 138/83, pulse 134, temperature 97.5 F (36.4 C), temperature source Oral, resp. rate 19, height 5\' 3"  (1.6 m), weight 58.3 kg (128 lb 8.5 oz), SpO2 97.00%.  Intake/Output from previous day: 03/22 0701 - 03/23 0700 In: 1040 [P.O.:1040] Out: -    Physical Exam:  Wd female in nad Chest with decreased bs, no wheezing.  +upper airway pseudowheezing with transmission to lower airways. Cor with regular tachy LE without edema Alert, oriented, moves all 4.     Lab Results:  [facility] 12/10/11 0155  WBC 9.6  HGB 14.4  HCT 45.2  PLT 211   BMET  Basename 12/11/11 0455 12/10/11 0155  NA 135 140  K 4.7 4.9  CL 95* 96  CO2 39* 41*  GLUCOSE 190* 150*  BUN 27* 23  CREATININE 0.71 0.66  CALCIUM 9.1 9.3    Studies/Results: No results found.  Assessment/Plan: Patient Active Hospital Problem List:  Acute exacerbation of chronic obstructive pulmonary disease (COPD) (12/08/2011)  the pt is currently doing much better, with no significant increased wob.  No definite bronchospasm on exam.  The low dose morphine has helped her breathing, but I would caution judicious use in someone who is a full code.  Will not increase dosing.  She is also having itching, and ?due to morphine.? Continue BD, abx, steroids.   Barbaraann Share, M.D. 12/12/2011, 3:01 PM

## 2011-12-12 NOTE — Evaluation (Signed)
Physical Therapy Evaluation Patient Details Name: [name] MRN: [id number] DOB: April 04, 1933 Today's Date: 12/12/2011  Problem List:  Patient Active Problem List  Diagnoses  . COLD SORE  . DEPRESSION  . Multiple sclerosis  . HYPERTENSION  . AORTIC STENOSIS  . Other specified cardiac dysrhythmias  . STASIS DERMATITIS  . ALLERGIC RHINITIS  . ASTHMA, CHRONIC OBSTRUCTIVE NOS  . COPD  . GERD  . DERMATITIS  . Rheumatoid arthritis  . DYSPNEA  . PAF (paroxysmal atrial fibrillation)  . Dyslipidemia  . Anxiety  . Acute exacerbation of chronic obstructive pulmonary disease (COPD)    Past Medical History:  Past Medical History  Diagnosis Date  . Dermatitis   . GERD (gastroesophageal reflux disease)   . Depression   . Aortic stenosis     mild by echo (2008)  . Paroxysmal atrial fibrillation 11/08  . Allergic rhinitis   . Rheumatoid arthritis   . Hypertension   . Bronchitis, chronic obstructive, with exacerbation   . FHx: colon cancer   . Stasis dermatitis   . Multiple sclerosis   . COPD (chronic obstructive pulmonary disease)   . Asthma   . Acute urinary tract infection    Past Surgical History:  Past Surgical History  Procedure Date  . Abdominal hysterectomy   . Breast cyst excision   . Lumbar disc surgery 2003    L5    PT Assessment/Plan/Recommendation PT Assessment Clinical Impression Statement: Pt moves well, but limited by cardiopulmonary status.  Pt ambulated to bathroom and returned o2 83% on 3 L/min and quickly increased into 902 with pursed lip breathing.  [name] HR in the mid to upper 130s.  Pt sat at EOB and after ~ HR went back under 100.  Pt ambulated 16 feet adnd vitals checked again with O2 96% and HR up to 147.  Nursing in room and  informed of elevated HR with activity.  Pt would benefit from continued skilled monitoring of vitals with mobility.  Pt wants to be discharged home rather than rehab.  Unsure at this time if pt could handle demands of  rehab based on current cardiopulmonary status. PT Recommendation/Assessment: Patient will need skilled PT in the acute care venue PT Problem List: Cardiopulmonary status limiting activity PT Therapy Diagnosis : Generalized weakness PT Plan PT Frequency: Min 3X/week PT Treatment/Interventions: Gait training;Functional mobility training;Other (comment) (energy conservation and breathing techniques) PT Recommendation Recommendations for Other Services: OT consult Follow Up Recommendations: Home health PT;Skilled nursing facility;Inpatient Rehab (Depends on pt's cardiopulmonary status.) Equipment Recommended: None recommended by PT PT Goals  Acute Rehab PT Goals PT Goal Formulation: With patient Time For Goal Achievement: 7 days Pt will Ambulate: 51 - 150 feet;Other (comment) (with oxygen > 90% and HR no greater than 115.) PT Goal: Ambulate - Progress: Goal set today Pt will Perform Home Exercise Program: Independently PT Goal: Perform Home Exercise Program - Progress: Goal set today Additional Goals Additional Goal #1: Pt will demonstrate ability to perform proper pursed lip breathing techniques with no verbal cueing. PT Goal: Additional Goal #1 - Progress: Goal set today  PT Evaluation Precautions/Restrictions    Prior Functioning  Home Living Lives With: Spouse Type of Home: House Home Layout: One level Home Access: Level entry Bathroom Shower/Tub: Health visitor: Standard Home Adaptive Equipment: Straight cane Prior Function Level of Independence: Independent with gait;Independent with transfers Driving: Yes Vocation: Retired Financial risk analyst Arousal/Alertness: Awake/alert Overall Cognitive Status: Appears within functional limits for tasks assessed Sensation/Coordination  Sensation Light Touch: Appears Intact Coordination Gross Motor Movements are Fluid and Coordinated: Yes Fine Motor Movements are Fluid and Coordinated: Yes Extremity Assessment RLE  Assessment RLE Assessment: Within Functional Limits LLE Assessment LLE Assessment: Within Functional Limits Mobility (including Balance) Bed Mobility Bed Mobility: Yes Supine to Sit: 7: Independent Transfers Transfers: Yes Sit to Stand: 6: Modified independent (Device/Increase time) Stand to Sit: 6: Modified independent (Device/Increase time) Ambulation/Gait Ambulation/Gait: Yes Ambulation/Gait Assistance: 5: Supervision Ambulation/Gait Assistance Details (indicate cue type and reason): Pt aware of safety with O2 tubing with gait. Ambulation Distance (Feet): 16 Feet Assistive device: None Gait Pattern: Within Functional Limits  Posture/Postural Control Posture/Postural Control: No significant limitations    End of Session PT - End of Session Activity Tolerance: Treatment limited secondary to medical complications (Comment) (elevated HR) Patient left: Other (comment);with call bell in reach;with family/visitor present (Sitting EOB eating lunch) Nurse Communication: Other (comment);Mobility status for ambulation (HR and O2 levels) General Behavior During Session: [facility] for tasks performed Cognition: [facility] for tasks performed  [facility] LUBECK 12/12/2011, 1:04 PM

## 2011-12-12 NOTE — Plan of Care (Signed)
Problem: Phase II Progression Outcomes Goal: O2 sats > equal to 90% on RA or at baseline Outcome: Not Progressing Pt with decreased O2 with gait to bathroom and with elevated HR with oxygen at 3 L/min.

## 2011-12-13 DIAGNOSIS — J438 Other emphysema: Secondary | ICD-10-CM | POA: Diagnosis not present

## 2011-12-13 DIAGNOSIS — J441 Chronic obstructive pulmonary disease with (acute) exacerbation: Secondary | ICD-10-CM | POA: Diagnosis not present

## 2011-12-13 DIAGNOSIS — J9620 Acute and chronic respiratory failure, unspecified whether with hypoxia or hypercapnia: Secondary | ICD-10-CM | POA: Diagnosis not present

## 2011-12-13 MED ORDER — PREDNISONE 20 MG PO TABS
30.0000 mg | ORAL_TABLET | Freq: Every day | ORAL | Status: DC
  Administered 2011-12-14: 30 mg via ORAL
  Filled 2011-12-13: qty 1

## 2011-12-13 NOTE — Progress Notes (Signed)
Subjective: Had done better with low dose mso4.  No increased wob and appears settled.  No significant cough or congestion Is asking about going home.  Objective: Vital signs in last 24 hours: Blood pressure 109/90, pulse 106, temperature 97.8 F (36.6 C), temperature source Oral, resp. rate 20, height 5\' 3"  (1.6 m), weight 58.3 kg (128 lb 8.5 oz), SpO2 97.00%.  Intake/Output from previous day: 03/23 0701 - 03/24 0700 In: 840 [P.O.:840] Out: -    Physical Exam:  Wd female in nad Chest with decreased bs, no wheezing.   Cor with regular tachy LE without edema Alert, oriented, moves all 4.     Lab Results: No results found for this basename: WBC:3,HGB:3,HCT:3,PLT:3 in the last 72 hours BMET  [facility] 12/11/11 0455  NA 135  K 4.7  CL 95*  CO2 39*  GLUCOSE 190*  BUN 27*  CREATININE 0.71  CALCIUM 9.1    Studies/Results: No results found.  Assessment/Plan: Patient Active Hospital Problem List:  Acute exacerbation of chronic obstructive pulmonary disease (COPD) (12/08/2011)  the pt is currently doing much better, with no significant increased wob.  No definite bronchospasm on exam.  The low dose morphine has helped her breathing, but I would caution judicious use in someone who is a full code. Continue BD, abx, wean prednisone.  ?d/c home in am.    Barbaraann Share, M.D. 12/13/2011, 2:41 PM

## 2011-12-14 ENCOUNTER — Telehealth: Payer: Self-pay | Admitting: Internal Medicine

## 2011-12-14 DIAGNOSIS — R0602 Shortness of breath: Secondary | ICD-10-CM

## 2011-12-14 DIAGNOSIS — J441 Chronic obstructive pulmonary disease with (acute) exacerbation: Secondary | ICD-10-CM | POA: Diagnosis not present

## 2011-12-14 DIAGNOSIS — J438 Other emphysema: Secondary | ICD-10-CM | POA: Diagnosis not present

## 2011-12-14 DIAGNOSIS — J9620 Acute and chronic respiratory failure, unspecified whether with hypoxia or hypercapnia: Secondary | ICD-10-CM | POA: Diagnosis not present

## 2011-12-14 MED ORDER — DIPHENHYDRAMINE HCL 25 MG PO CAPS: 25.0000 mg | ORAL_CAPSULE | Freq: Four times a day (QID) | ORAL | Status: AC | PRN

## 2011-12-14 MED ORDER — MORPHINE SULFATE 10 MG/5ML PO SOLN: 2.5000 mg | ORAL | Status: AC | PRN

## 2011-12-14 MED ORDER — IPRATROPIUM BROMIDE 0.02 % IN SOLN: 0.5000 mg | Freq: Four times a day (QID) | RESPIRATORY_TRACT | Status: DC

## 2011-12-14 MED ORDER — BUDESONIDE 0.25 MG/2ML IN SUSP: 0.2500 mg | Freq: Four times a day (QID) | RESPIRATORY_TRACT | Status: DC

## 2011-12-14 MED ORDER — SENNA 8.6 MG PO TABS: 2.0000 | ORAL_TABLET | Freq: Every day | ORAL | Status: DC

## 2011-12-14 MED ORDER — LEVALBUTEROL HCL 0.63 MG/3ML IN NEBU: 1.0000 | INHALATION_SOLUTION | Freq: Four times a day (QID) | RESPIRATORY_TRACT | Status: DC | PRN

## 2011-12-14 MED ORDER — PREDNISONE 10 MG PO TABS: ORAL_TABLET | ORAL | Status: DC

## 2011-12-14 MED ORDER — ALPRAZOLAM 0.25 MG PO TABS: 0.2500 mg | ORAL_TABLET | Freq: Every day | ORAL | Status: DC

## 2011-12-14 MED ORDER — LEVALBUTEROL HCL 0.63 MG/3ML IN NEBU: 0.6300 mg | INHALATION_SOLUTION | Freq: Four times a day (QID) | RESPIRATORY_TRACT | Status: DC

## 2011-12-14 NOTE — Progress Notes (Signed)
[facility] for [facility],PT and OT.  Darl Pikes Day notified of hhc needs.

## 2011-12-14 NOTE — Progress Notes (Signed)
Physical Therapy Treatment Patient Details Name: [name] MRN: [id number] DOB: 08-Mar-1933 Today's Date: 12/14/2011  PT Assessment/Plan  PT - Assessment/Plan Comments on Treatment Session: pt improving; agree with plan for cardio pulmonary rehab  as OP when HR controlled PT Plan: Discharge plan remains appropriate;Frequency remains appropriate PT Frequency: Min 3X/week Follow Up Recommendations: Home health PT;Outpatient PT (vs cardiac rehab) Equipment Recommended: None recommended by PT PT Goals  Acute Rehab PT Goals Pt will Ambulate: 51 - 150 feet;Other (comment) PT Goal: Ambulate - Progress: Progressing toward goal  PT Treatment Precautions/Restrictions  Restrictions Weight Bearing Restrictions: No Mobility (including Balance) Transfers Transfers: Yes Sit to Stand: 6: Modified independent (Device/Increase time) Stand to Sit: 6: Modified independent (Device/Increase time) Ambulation/Gait Ambulation/Gait: Yes Ambulation/Gait Assistance: 5: Supervision Ambulation/Gait Assistance Details (indicate cue type and reason): pt with LOB x 1  with head turn to speak to RN;  min/guard recovery; HR 120 at rest, 137 with  amb, 122 after amb and 2 minute rest, sats > 92% on 3 liters throughout Ambulation Distance (Feet): 40 Feet Assistive device: None Gait Pattern: Within Functional Limits    Exercise    End of Session PT - End of Session Activity Tolerance: Other (comment) (limited by increased HR, although better than last session) Patient left: in chair;with call bell in reach General Behavior During Session: [facility] for tasks performed Cognition: [facility] for tasks performed  [facility] 12/14/2011, 10:41 AM

## 2011-12-14 NOTE — Consult Note (Signed)
Brief Consult Note:  Thank you for consult on this pleasant patient.  Chart reviewed.  Patient reports that she's close to baseline and has been walking in the room without difficulty.  She has been limited by pulmonary status since Dec but does not wish to be in the hospital any more. She prefers outpatient pulmonary rehab and has good support at home. We would recommend HHPT with pulmonary rehab in a couple of weeks for follow up therapies.  Will defer rehab consult for now.

## 2011-12-14 NOTE — Telephone Encounter (Signed)
LMTCB

## 2011-12-14 NOTE — Discharge Summary (Signed)
Physician Discharge Summary  Patient ID: [name] MRN: [id number] DOB/AGE: 20-Nov-1932 78 y.o.  Admit date: 12/07/2011 Discharge date: 12/14/2011  Admission Diagnoses: Shortness of Breath  Discharge Diagnoses:  Active Problems:  Acute exacerbation of chronic obstructive pulmonary disease (COPD)  In-patient data Cultures:  MRSA screen 3/18>>POSITIVE   Antibiotics:  Avelox 3/18>>   Tests/events:  3/20 Echo>>EF 50 to 55%, PAS 42 mmHg  Brief history [name] is a 78 y.o. female former smoker admitted on 12/07/2011 with AECOPD. Followed by Dr. Jetty Duhamel as outpt.  PMHx GERD, Depression, PAF, RA, HTN, MS    Hospital Course:  AECOPD Admitted on 3/18 with 4-5 days of increasing SOB ,fever with out chills or sputum production. She was last hospitalized in 08/2011 for same and felt that she was discharged too early. She reported that she has never returned to her previous baseline which is independent living with mild exertional limitation due to SOB. She was admitted and treated in usual fashion with IV steroids BD'S and empiric antibiotics. Additionally we added PRN morphine which seemed to help significantly. She has currently met maximum hospital benefit and is cleared for discharge to home with close follow up.  Plan: -she feels inpt nebulizer regimen working better>>continue pulmicort, atrovent, xopenex  -finished 7 days of avelox  -wean prednisone as tolerated to home dose   Chronic dyspnea  -improved with low dose morphine>>will change to prn   Pruritus likely from morphine, new issue but tolerable.  -prn benadryl  -okay to continue prn morphine   HTN  -HCTZ   Hyperlipidemia  -continue simvastatin   Anxiety/depression  -continue xanax   GERD  -protonix  Discharge Exam: BP 100/67  Pulse 96  Temp(Src) 97.5 F (36.4 C) (Oral)  Resp 18  Ht 5\' 3"  (1.6 m)  Wt 58.3 kg (128 lb 8.5 oz)  BMI 22.77 kg/m2  SpO2 97%  Physical Exam:  General - no distress    HEENT - no oral exudate  Cardiac - s1s2 regular, no murmur  Chest - decreased breath sounds, no wheeze  Abd - soft, nontender  Ext - no edema  Neuro - normal strength  Psych - normal mood, behavior   Labs at discharge Lab Results  Component Value Date   CREATININE 0.63 12/14/2011   BUN 27* 12/11/2011   NA 135 12/11/2011   K 4.7 12/11/2011   CL 95* 12/11/2011   CO2 39* 12/11/2011   Lab Results  Component Value Date   WBC 9.6 12/10/2011   HGB 14.4 12/10/2011   HCT 45.2 12/10/2011   MCV 92.1 12/10/2011   PLT 211 12/10/2011   Lab Results  Component Value Date   ALT 23 12/07/2011   AST 18 12/07/2011   ALKPHOS 59 12/07/2011   BILITOT 0.3 12/07/2011   Lab Results  Component Value Date   INR 0.96 12/07/2011   INR 1.0 07/29/2007    Current radiology studies No results found.  Disposition:  01-Home or Self Care  Discharge Orders    Future Appointments: Provider: Department: Dept Phone: Center:   12/22/2011 10:45 AM Waymon Budge, MD [facility] [phone] None   01/11/2012 11:30 AM Wendall Stade, MD [facility] [phone] [facility]     Future Orders Please Complete By Expires   For home use only DME oxygen      Diet - low sodium heart healthy      Increase activity slowly      Call MD for:  temperature >  100.4      Call MD for:  severe uncontrolled pain      Call MD for:  difficulty breathing, headache or visual disturbances      (HEART FAILURE PATIENTS) Call MD:  Anytime you have any of the following symptoms: 1) 3 pound weight gain in 24 hours or 5 pounds in 1 week 2) shortness of breath, with or without a dry hacking cough 3) swelling in the hands, feet or stomach 4) if you have to sleep on extra pillows at night in order to breathe.        Medication List  As of 12/14/2011 12:16 PM   STOP taking these medications         amoxicillin-clavulanate 875-125 MG per tablet         TAKE these medications         albuterol 108 (90 BASE) MCG/ACT inhaler    Commonly known as: PROVENTIL HFA;VENTOLIN HFA   Inhale 2 puffs into the lungs every 6 (six) hours as needed for wheezing or shortness of breath. For shortness of breath      ALPRAZolam 0.25 MG tablet   Commonly known as: XANAX   Take 1 tablet (0.25 mg total) by mouth at bedtime as needed.      ALPRAZolam 0.25 MG tablet   Commonly known as: XANAX   Take 1 tablet (0.25 mg total) by mouth daily.      aspirin 81 MG EC tablet   Take 1 tablet (81 mg total) by mouth daily.      budesonide 0.25 MG/2ML nebulizer solution   Commonly known as: PULMICORT   Take 2 mLs (0.25 mg total) by nebulization 4 (four) times daily.      Dextromethorphan-Guaifenesin 60-1200 MG per 12 hr tablet   Take 1 tablet by mouth every 12 (twelve) hours.      diphenhydrAMINE 25 mg capsule   Commonly known as: BENADRYL   Take 1 capsule (25 mg total) by mouth every 6 (six) hours as needed for itching.      hydrochlorothiazide 25 MG tablet   Commonly known as: HYDRODIURIL   Take 25 mg by mouth daily.      ipratropium 0.02 % nebulizer solution   Commonly known as: ATROVENT   Take 2.5 mLs (0.5 mg total) by nebulization 4 (four) times daily.      levalbuterol 0.63 MG/3ML nebulizer solution   Commonly known as: XOPENEX   Take 3 mLs (0.63 mg total) by nebulization 4 (four) times daily.      levalbuterol 0.63 MG/3ML nebulizer solution   Commonly known as: XOPENEX   Take 3 mLs (0.63 mg total) by nebulization every 6 (six) hours as needed for wheezing.      Magnesium Oxide 400 mg Tabs   1 daily      morphine 10 MG/5ML solution   Take 1.3 mLs (2.6 mg total) by mouth every 4 (four) hours as needed for pain (pain or shortness of breath ).      predniSONE 10 MG tablet   Commonly known as: DELTASONE   Take 3 tabs daily for 4 days, then 2 tabs daily for 4 days, then 1 tab and hold      senna 8.6 MG Tabs   Commonly known as: SENOKOT   Take 2 tablets (17.2 mg total) by mouth at bedtime.      simvastatin 10 MG tablet     Commonly known as: ZOCOR   Take 5 mg by mouth  at bedtime.         ASK your doctor about these medications         arformoterol 15 MCG/2ML Nebu   Commonly known as: BROVANA   Take 15 mcg by nebulization 2 (two) times daily.           Follow-up Information    Follow up with Waymon Budge, MD on 12/22/2011. (at 1045)    Contact information:   [address] [employer] [facility] [place] [address] [phone]          Discharged Condition: good, ambulatory on 4.5 - 5 liters  Physician Statement:   The Patient was personally examined, the discharge assessment and plan has been personally reviewed and I agree with ACNP Babcock's assessment and plan. > 30 minutes of time have been dedicated to discharge assessment, planning and discharge instructions.   SignedAnders Simmonds 12/14/2011, 12:16 PM  Coralyn Helling, MD 12/14/2011, 4:03 PM

## 2011-12-14 NOTE — Progress Notes (Signed)
[name] is a 78 y.o. female former smoker admitted on 12/07/2011 with AECOPD.  Followed by Dr. Jetty Duhamel as outpt. PMHx GERD, Depression, PAF, RA, HTN, MS  Cultures: MRSA screen 3/18>>POSITIVE  Antibiotics: Avelox 3/18>>  Tests/events: 3/20 Echo>>EF 50 to 55%, PAS 42 mmHg  SUBJECTIVE: Breathing better.  Morphine has helped her dyspnea.  She did get some itching after taking morphine, but did not have rash/hives/swelling.  She feels neb tx's in hospital work better than her home regimen.  OBJECTIVE:  Blood pressure 100/67, pulse 96, temperature 97.5 F (36.4 C), temperature source Oral, resp. rate 18, height 5\' 3"  (1.6 m), weight 128 lb 8.5 oz (58.3 kg), SpO2 97.00%. Wt Readings from Last 3 Encounters:  12/10/11 128 lb 8.5 oz (58.3 kg)  11/24/11 138 lb 3.2 oz (62.687 kg)  09/28/11 135 lb 3.2 oz (61.326 kg)   Body mass index is 22.77 kg/(m^2).  I/O last 3 completed shifts: In: 480 [P.O.:480] Out: -   Physical Exam: General - no distress HEENT - no oral exudate Cardiac - s1s2 regular, no murmur Chest - decreased breath sounds, no wheeze Abd - soft, nontender Ext - no edema Neuro - normal strength Psych - normal mood, behavior  CBC    Component Value Date/Time   WBC 9.6 12/10/2011 0155   RBC 4.91 12/10/2011 0155   HGB 14.4 12/10/2011 0155   HCT 45.2 12/10/2011 0155   PLT 211 12/10/2011 0155   MCV 92.1 12/10/2011 0155   MCH 29.3 12/10/2011 0155   MCHC 31.9 12/10/2011 0155   RDW 13.5 12/10/2011 0155   LYMPHSABS 0.9 09/08/2011 1117   MONOABS 1.7* 09/08/2011 1117   EOSABS 0.1 09/08/2011 1117   BASOSABS 0.0 09/08/2011 1117    BMET    Component Value Date/Time   NA 135 12/11/2011 0455   K 4.7 12/11/2011 0455   CL 95* 12/11/2011 0455   CO2 39* 12/11/2011 0455   GLUCOSE 190* 12/11/2011 0455   BUN 27* 12/11/2011 0455   CREATININE 0.63 12/14/2011 0508   CALCIUM 9.1 12/11/2011 0455   GFRNONAA 85* 12/14/2011 0508   GFRAA >90 12/14/2011 0508    Lab Results  Component  Value Date   ALT 23 12/07/2011   AST 18 12/07/2011   ALKPHOS 59 12/07/2011   BILITOT 0.3 12/07/2011   ABG    Component Value Date/Time   TCO2 32 12/07/2011 1204    No results found.  ASSESSMENT/PLAN:  AECOPD -she feels inpt nebulizer regimen working better>>continue pulmicort, atrovent, xopenex -finish 7 days of avelox -wean prednisone as tolerated to home dose  Chronic dyspnea -improved with low dose morphine>>will change to prn  Pruritus likely from morphine -prn benadryl -okay to continue prn morphine   HTN -HCTZ  Hyperlipidemia -continue simvastatin  Anxiety/depression -continue xanax  GERD  -protonix  Disposition -okay to d/c home -will need outpt f/u with either Tammy Parrett or Dr. Maple Hudson in one week -PT recommending outpt pulmonary rehab>>re-assess as outpt  [name] Pager:  [phone] 12/14/2011, 11:12 AM

## 2011-12-15 NOTE — Telephone Encounter (Signed)
[name] son called back again. Hazel Sams

## 2011-12-15 NOTE — Telephone Encounter (Addendum)
[name] son mike returned call. Call his cell [phone]. Hazel Sams

## 2011-12-16 DIAGNOSIS — J45909 Unspecified asthma, uncomplicated: Secondary | ICD-10-CM | POA: Diagnosis not present

## 2011-12-16 DIAGNOSIS — G35 Multiple sclerosis: Secondary | ICD-10-CM | POA: Diagnosis not present

## 2011-12-16 DIAGNOSIS — I4891 Unspecified atrial fibrillation: Secondary | ICD-10-CM | POA: Diagnosis not present

## 2011-12-16 DIAGNOSIS — J441 Chronic obstructive pulmonary disease with (acute) exacerbation: Secondary | ICD-10-CM | POA: Diagnosis not present

## 2011-12-16 DIAGNOSIS — J45901 Unspecified asthma with (acute) exacerbation: Secondary | ICD-10-CM | POA: Diagnosis not present

## 2011-12-16 DIAGNOSIS — F329 Major depressive disorder, single episode, unspecified: Secondary | ICD-10-CM | POA: Diagnosis not present

## 2011-12-16 NOTE — Telephone Encounter (Signed)
Per [place] at [phone], all of the nebulizer medications are covered under the pt's plan but the pharmacy must file this under [facility] PART B. Per Tedd Sias, the pharmacy needs to resubmit the claim and then they (pharmacy) will be the one to reimburse the pt's son. I have spoke with the pt's son [name] and he is aware of what needs to be done through the pharmacy. He will make sure the pharmacy has the [facility] PART B information. "I will also speak with the pharmacist to make sure they are aware. [name] will call if he has any further questions or problems regarding this issue.   I spoke with [name] at [facility] in [place] and she says the pt or her son will need to bring pt's MCR PART B information and they will take care of this issue and the pt's son will be reimbursed.

## 2011-12-16 NOTE — Telephone Encounter (Signed)
I spoke with the pt son and he states [facility] is advising them that the pt neb meds needs a PA in order for the pt to be reimbursed for what they paid out of pocket. The pt has been getting neb meds through [facility], but at discharge neb rx was sent to [facility] and it was not covered be insurance. We need to contact insurance and get PA approval in order for [facility] to reimburse her for payment. Future refills need to be sent to [facility]. PA # is [phone]. I spoke to [place] about this and she is doing PA today so she advised I send message to her to complete. Carron Curie, CMA

## 2011-12-18 DIAGNOSIS — J441 Chronic obstructive pulmonary disease with (acute) exacerbation: Secondary | ICD-10-CM | POA: Diagnosis not present

## 2011-12-18 DIAGNOSIS — J45901 Unspecified asthma with (acute) exacerbation: Secondary | ICD-10-CM | POA: Diagnosis not present

## 2011-12-18 DIAGNOSIS — G35 Multiple sclerosis: Secondary | ICD-10-CM | POA: Diagnosis not present

## 2011-12-18 DIAGNOSIS — J45909 Unspecified asthma, uncomplicated: Secondary | ICD-10-CM | POA: Diagnosis not present

## 2011-12-18 DIAGNOSIS — F329 Major depressive disorder, single episode, unspecified: Secondary | ICD-10-CM | POA: Diagnosis not present

## 2011-12-18 DIAGNOSIS — I4891 Unspecified atrial fibrillation: Secondary | ICD-10-CM | POA: Diagnosis not present

## 2011-12-21 DIAGNOSIS — F329 Major depressive disorder, single episode, unspecified: Secondary | ICD-10-CM | POA: Diagnosis not present

## 2011-12-21 DIAGNOSIS — J441 Chronic obstructive pulmonary disease with (acute) exacerbation: Secondary | ICD-10-CM | POA: Diagnosis not present

## 2011-12-21 DIAGNOSIS — I4891 Unspecified atrial fibrillation: Secondary | ICD-10-CM | POA: Diagnosis not present

## 2011-12-21 DIAGNOSIS — J45901 Unspecified asthma with (acute) exacerbation: Secondary | ICD-10-CM | POA: Diagnosis not present

## 2011-12-21 DIAGNOSIS — G35 Multiple sclerosis: Secondary | ICD-10-CM | POA: Diagnosis not present

## 2011-12-21 DIAGNOSIS — J45909 Unspecified asthma, uncomplicated: Secondary | ICD-10-CM | POA: Diagnosis not present

## 2011-12-22 ENCOUNTER — Telehealth: Payer: Self-pay | Admitting: Internal Medicine

## 2011-12-22 ENCOUNTER — Inpatient Hospital Stay: Payer: Medicare Other | Admitting: Adult Health

## 2011-12-22 ENCOUNTER — Ambulatory Visit (INDEPENDENT_AMBULATORY_CARE_PROVIDER_SITE_OTHER): Payer: Medicare Other | Admitting: Internal Medicine

## 2011-12-22 ENCOUNTER — Encounter: Payer: Self-pay | Admitting: Internal Medicine

## 2011-12-22 VITALS — BP 140/80 | HR 108 | Ht 63.0 in | Wt 129.8 lb

## 2011-12-22 DIAGNOSIS — J441 Chronic obstructive pulmonary disease with (acute) exacerbation: Secondary | ICD-10-CM | POA: Diagnosis not present

## 2011-12-22 DIAGNOSIS — I4891 Unspecified atrial fibrillation: Secondary | ICD-10-CM | POA: Diagnosis not present

## 2011-12-22 DIAGNOSIS — J449 Chronic obstructive pulmonary disease, unspecified: Secondary | ICD-10-CM

## 2011-12-22 DIAGNOSIS — I480 Paroxysmal atrial fibrillation: Secondary | ICD-10-CM

## 2011-12-22 MED ORDER — IPRATROPIUM BROMIDE 0.02 % IN SOLN: 0.5000 mg | Freq: Four times a day (QID) | RESPIRATORY_TRACT | Status: DC

## 2011-12-22 MED ORDER — FORMOTEROL FUMARATE 20 MCG/2ML IN NEBU: 20.0000 ug | INHALATION_SOLUTION | Freq: Two times a day (BID) | RESPIRATORY_TRACT | Status: DC

## 2011-12-22 MED ORDER — BUDESONIDE 0.25 MG/2ML IN SUSP: 0.2500 mg | Freq: Four times a day (QID) | RESPIRATORY_TRACT | Status: DC

## 2011-12-22 MED ORDER — LEVALBUTEROL HCL 0.63 MG/3ML IN NEBU: 1.0000 | INHALATION_SOLUTION | Freq: Four times a day (QID) | RESPIRATORY_TRACT | Status: DC | PRN

## 2011-12-22 NOTE — Telephone Encounter (Signed)
Spoke with pt. She states looking at her AVS and was confused about zocor. I advised another name is simvastatin, and this is to tx her cholesterol problem. She verbalized understanding. States that she does take this med, so I will leave MAR as is. Pt states nothing further needed.

## 2011-12-22 NOTE — Progress Notes (Signed)
Patient ID: [name], female    DOB: 1933/07/01, 78 y.o.   MRN: [id number]  HPI 48 yoF former smoker with COPD/ chronic obstructive asthma, marked labile component with recurrent acute bronchitis complicated by  Paroxysmal atrial fib and hx Aortic Stenosis. After bad 2-3 months, she cleared completely with 30 days of azithromycin given February 6. Dr Johnsie Cancel has seen her and felt her murmur is stable. In spite of pollen season she feels very well. Sleeps with O2 every night and uses it as needed in daytime.   04/21/11- 31  yoF former smoker with COPD/ chronic obstructive asthma, marked labile component with recurrent acute bronchitis complicated by  Paroxysmal atrial fib and hx Aortic Stenosis. CXR pending today She called last night with exacerbation and comes in this morning. She called her PCP 6 weeks ago with increased wheeze. Was given azithromycin x 1 month, prednisone taper then 10 mg daily. Could tend garden in heat, but remained tight in chest . In last 2 days much tighter, "full" in chest. Denies fever, sore throat, green, chest pain or swelling.  Arthritis bothering her, stressed by husband who has had another CVA, caring for 7 pets, doing a lot of canning- feels "so tired". Continues O2 2 L/M for sleep and prn.  04/30/11- 04/21/11- 78  yoF former smoker with COPD/ chronic obstructive asthma, marked labile component with recurrent acute bronchitis complicated by  Paroxysmal atrial fib and hx Aortic Stenosis Doesn't feel toxic or infected, but wheeze isn't breaking. Little phlegm. She tapered from 60 to 20 mg prednisone daily. We discussed her improvement during the winter with a prolonged course of azithromycin. We discussed the anti-inflammatory effect attributed to macrolides; also the tocolytic effect of magnesium.   05/18/11-  19  yoF former smoker with COPD/ chronic obstructive asthma, marked labile component with recurrent acute bronchitis complicated by  Paroxysmal atrial fib and hx  Aortic Stenosis She reports feeling "some better" - able now to walk out to garden. No new pain or infection or acute problems.  She has been taking zithromax 1 daily maintenance, and she completed 20 days of manesium. After last burst, prednisone is back now to 10 mg daily.   08/17/11- 34  yoF former smoker with COPD/ chronic obstructive asthma, marked labile component with recurrent acute bronchitis complicated by  Paroxysmal atrial fib and hx Aortic Stenosis She says she had been doing very well. Her family shared a viral syndrome and she got it a week ago. Describes aching low-grade fever nasal congestion fatigue with some cough and wheeze. 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 Her physician gave steroid shot, doxycycline pending today. She had tapered maintenance prednisone 5 mg every other day but increased to 10 mg daily a few days ago. Has also had left cataract surgery.  09/28/11-  72  yoF former smoker with COPD/ chronic obstructive asthma, marked labile component with recurrent acute bronchitis complicated by  Paroxysmal atrial fib and hx Aortic Stenosis Hospital follow up visit. Was hospitalized at [facility] long December 18 with a viral pattern bronchitis, green sputum. Dr. Henrene Pastor had given Z-Pak and we sent Tamiflu. She then got Augmentin plus Levaquin plus prednisone for hospital discharge. "Still not over it" with residual cough of white sputum. She has tapered prednisone back to 10 mg daily. Has a cold sore now on her upper lip which she  is treating. Discharge summary was reviewed. CT scan of chest 09/08/2011 showed COPD and emphysema with bronchitis changes in the right upper lobe, calcified coronary artery plaques. Images reviewed with her.  11/24/11-  77  yoF former smoker with COPD/ chronic obstructive asthma, marked labile component with recurrent acute bronchitis  complicated by  Paroxysmal atrial fib and hx Aortic Stenosis Now on prednisone taper day 5, started at 60 mg daily. Somewhat better. This exacerbation started when she stood out in the cold. Next day she had  yellow postnasal drainage. Now coughing productive green and yellow. No blood no chest pain. Father smoked and died of COPD. We had an end of life discussion. She would favor resuscitation but not sustained life support.  12/22/11- 61  yoF former smoker with COPD/ chronic obstructive asthma, marked labile component with recurrent acute bronchitis complicated by  Paroxysmal atrial fib and hx Aortic Stenosis Hospital follow up visit-hospitalized 12/07/2011 through 12/14/2011 for exacerbation of COPD. On a prednisone taper now at 40 mg daily with maintenance cold 10 mg daily. Using oxygen for sleep. She is not clear about her discharge medications. She was treated for anxiety and dyspnea during the hospitalization, using Xanax and then morphine.  Review of Systems-see HPI Constitutional:   No weight loss, night sweats, Fevers, chills, + fatigue, lassitude. HEENT:   No headaches,  Difficulty swallowing,  Tooth/dental problems,  Sore throat,                No sneezing, itching, ear ache, +nasal congestion, post nasal drip,  CV:  No chest pain,  Orthopnea, PND, swelling in lower extremities, anasarca, dizziness, palpitations GI  No heartburn, indigestion, abdominal pain, nausea,   Resp:-No excess mucus,  + mild cough, scant sputum,  No coughing up of blood.  No change in color of mucus.  + wheezing. Skin: no rash or lesions. GU: . MS:  + arthritis pains Psych:  No change in mood or affect. No depression or anxiety.  No memory loss.  Objective:   Physical Exam General- Alert, Oriented, Affect-appropriate, Distress-  none; talkative Skin- cold sore on upper lip. Lymphadenopathy- none Head- atraumatic            Eyes- Gross vision intact, PERRLA, conjunctivae clear secretions            Ears-  Hearing, canals normal            Nose- Clear, No-Septal dev, mucus, polyps, erosion, perforation             Throat- Mallampati II , mucosa clear , drainage- none, tonsils- atrophic, hoarse Neck- flexible , trachea midline, no stridor , thyroid nl, carotid no bruit Chest - symmetrical excursion , unlabored           Heart/CV- IRR , no murmur , no gallop  , no rub, nl s1 s2                            JVD- -none, edema- none, stasis changes- none, varices- none           Lung- anterior chest rattle, decreased breath sounds, unlabored., cough+ , dullness-none, rub- none.            Chest wall-  Abd-  Br/ Gen/ Rectal- Not done, not indicated Extrem- cyanosis- none, clubbing, none, atrophy- none, strength- nl     +osteoarthritis changes in hands Neuro- grossly intact to observation

## 2011-12-22 NOTE — Patient Instructions (Addendum)
Order- schedule 6 MWT   Dx COPD  Order- [facility]- DME- [facility]- nebulizer solution for Medicare part B use     Dx COPD 1) budesonide twice daily (steroid)   - every day# 2) levalbuterol- up to 4 times daily if needed 3) ipratropium- with levalbuterol, up to 4 times daily if needed 4) perforomist- twice daily every day #

## 2011-12-23 DIAGNOSIS — I4891 Unspecified atrial fibrillation: Secondary | ICD-10-CM | POA: Diagnosis not present

## 2011-12-23 DIAGNOSIS — F329 Major depressive disorder, single episode, unspecified: Secondary | ICD-10-CM | POA: Diagnosis not present

## 2011-12-23 DIAGNOSIS — G35 Multiple sclerosis: Secondary | ICD-10-CM | POA: Diagnosis not present

## 2011-12-23 DIAGNOSIS — J45909 Unspecified asthma, uncomplicated: Secondary | ICD-10-CM | POA: Diagnosis not present

## 2011-12-23 DIAGNOSIS — J441 Chronic obstructive pulmonary disease with (acute) exacerbation: Secondary | ICD-10-CM | POA: Diagnosis not present

## 2011-12-23 DIAGNOSIS — J45901 Unspecified asthma with (acute) exacerbation: Secondary | ICD-10-CM | POA: Diagnosis not present

## 2011-12-28 ENCOUNTER — Telehealth: Payer: Self-pay | Admitting: Internal Medicine

## 2011-12-28 NOTE — Telephone Encounter (Signed)
Noted and will forward to CY as an [place].

## 2011-12-30 DIAGNOSIS — G35 Multiple sclerosis: Secondary | ICD-10-CM | POA: Diagnosis not present

## 2011-12-30 DIAGNOSIS — J45901 Unspecified asthma with (acute) exacerbation: Secondary | ICD-10-CM

## 2011-12-30 DIAGNOSIS — J441 Chronic obstructive pulmonary disease with (acute) exacerbation: Secondary | ICD-10-CM | POA: Diagnosis not present

## 2011-12-30 DIAGNOSIS — F329 Major depressive disorder, single episode, unspecified: Secondary | ICD-10-CM | POA: Diagnosis not present

## 2011-12-30 DIAGNOSIS — I10 Essential (primary) hypertension: Secondary | ICD-10-CM

## 2011-12-30 DIAGNOSIS — I4891 Unspecified atrial fibrillation: Secondary | ICD-10-CM | POA: Diagnosis not present

## 2011-12-30 DIAGNOSIS — Z9981 Dependence on supplemental oxygen: Secondary | ICD-10-CM

## 2011-12-30 DIAGNOSIS — J45909 Unspecified asthma, uncomplicated: Secondary | ICD-10-CM | POA: Diagnosis not present

## 2011-12-30 NOTE — Assessment & Plan Note (Signed)
Is approaching baseline but still on prednisone 40 mg daily. She has severe obstructive asthma. We have begun end of life discussions, with only general discussion. She is considering.

## 2011-12-30 NOTE — Assessment & Plan Note (Signed)
By exam she appears to be in sinus rhythm now. We discussed impact of her bronchodilators.

## 2011-12-30 NOTE — Assessment & Plan Note (Addendum)
Severe chronic obstructive pulmonary disease with a significant reactive component. Former smoker. We have discussed oxygen use. Currently she uses it at night and as needed with exertion. Detailed discussion of home nebulizer use and appropriate medications.

## 2012-01-05 ENCOUNTER — Ambulatory Visit (INDEPENDENT_AMBULATORY_CARE_PROVIDER_SITE_OTHER): Payer: Medicare Other | Admitting: Internal Medicine

## 2012-01-05 DIAGNOSIS — R0609 Other forms of dyspnea: Secondary | ICD-10-CM

## 2012-01-05 DIAGNOSIS — J449 Chronic obstructive pulmonary disease, unspecified: Secondary | ICD-10-CM | POA: Diagnosis not present

## 2012-01-05 DIAGNOSIS — R0600 Dyspnea, unspecified: Secondary | ICD-10-CM | POA: Insufficient documentation

## 2012-01-08 ENCOUNTER — Telehealth: Payer: Self-pay | Admitting: Internal Medicine

## 2012-01-08 NOTE — Telephone Encounter (Signed)
Spoke with pt and notified that CDY is out of the office today Will have to call her next wk with results Pt verbalized understanding and states nothing further needed.  Please advise, thanks!

## 2012-01-10 NOTE — Progress Notes (Signed)
Documentation for 6 minute walk test 

## 2012-01-11 ENCOUNTER — Encounter: Payer: Self-pay | Admitting: Cardiovascular Disease

## 2012-01-11 ENCOUNTER — Ambulatory Visit (INDEPENDENT_AMBULATORY_CARE_PROVIDER_SITE_OTHER): Payer: Medicare Other | Admitting: Cardiovascular Disease

## 2012-01-11 ENCOUNTER — Telehealth: Payer: Self-pay | Admitting: Internal Medicine

## 2012-01-11 VITALS — BP 143/87 | HR 105 | Ht 63.0 in | Wt 132.0 lb

## 2012-01-11 DIAGNOSIS — J449 Chronic obstructive pulmonary disease, unspecified: Secondary | ICD-10-CM | POA: Diagnosis not present

## 2012-01-11 DIAGNOSIS — I359 Nonrheumatic aortic valve disorder, unspecified: Secondary | ICD-10-CM | POA: Diagnosis not present

## 2012-01-11 DIAGNOSIS — I10 Essential (primary) hypertension: Secondary | ICD-10-CM

## 2012-01-11 MED ORDER — ALPRAZOLAM 0.25 MG PO TABS: 0.2500 mg | ORAL_TABLET | Freq: Every day | ORAL | Status: AC

## 2012-01-11 NOTE — Patient Instructions (Signed)
Your physician wants you to follow-up in: 1 year with Dr. Nishan.  You will receive a reminder letter in the mail two months in advance. If you don't receive a letter, please call our office to schedule the follow-up appointment.  

## 2012-01-11 NOTE — Progress Notes (Signed)
78 yo with severe asthmatic chronic lung disease on oxygen at home. Chronic dyspnea. Previous echo 2010 with mild to moderate AS mean gradient 19. Not a candidate for open surgery. No palpiations, SSCP, cough sputum or syncope. Hospitalized twice last year for pneumonia. No history of CAD. She has had PAF in 2008 and is at risk for MAT. Doing quite well this Spring and surprisingly the pollen is not bothering her as much. HTN under good control  Echo 3/13 with mean gradient of 12 and peak of 23 mmHg.  ROS: Denies fever, malais, weight loss, blurry vision, decreased visual acuity, cough, sputum, SOB, hemoptysis, pleuritic pain, palpitaitons, heartburn, abdominal pain, melena, lower extremity edema, claudication, or rash.  All other systems reviewed and negative  General: Affect appropriate Chronically ill lunger HEENT: normal Neck supple with no adenopathy JVP normal no bruits no thyromegaly Lungs basilar crackler no wheezing and good diaphragmatic motion Heart:  S1/S2 SEM  no rub, gallop or click PMI normal Abdomen: benighn, BS positve, no tenderness, no AAA no bruit.  No HSM or HJR Distal pulses intact with no bruits No edema Neuro non-focal Skin warm and dry No muscular weakness   Current Outpatient Prescriptions  Medication Sig Dispense Refill  . albuterol (PROAIR HFA) 108 (90 BASE) MCG/ACT inhaler Inhale 2 puffs into the lungs every 6 (six) hours as needed for wheezing or shortness of breath. For shortness of breath  1 Inhaler  prn  . ALPRAZolam (XANAX) 0.25 MG tablet Take 1 tablet (0.25 mg total) by mouth daily.  30 tablet  0  . aspirin EC 81 MG EC tablet Take 1 tablet (81 mg total) by mouth daily.  30 tablet  0  . budesonide (PULMICORT) 0.25 MG/2ML nebulizer solution Take 2 mLs (0.25 mg total) by nebulization 4 (four) times daily.  60 mL  prn  . Dextromethorphan-Guaifenesin 60-1200 MG per 12 hr tablet Take 1 tablet by mouth every 12 (twelve) hours.        . formoterol (PERFOROMIST)  20 MCG/2ML nebulizer solution Take 2 mLs (20 mcg total) by nebulization 2 (two) times daily.  120 mL  prn  . hydrochlorothiazide 25 MG tablet Take 25 mg by mouth daily.        Marland Kitchen ipratropium (ATROVENT) 0.02 % nebulizer solution Take 2.5 mLs (0.5 mg total) by nebulization 4 (four) times daily.  75 mL  6  . levalbuterol (XOPENEX) 0.63 MG/3ML nebulizer solution Take 3 mLs (0.63 mg total) by nebulization every 6 (six) hours as needed for wheezing or shortness of breath.  360 mL  12  . predniSONE (DELTASONE) 10 MG tablet Take 3 tabs daily for 4 days, then 2 tabs daily for 4 days, then 1 tab and hold  40 tablet  0  . simvastatin (ZOCOR) 10 MG tablet Take 5 mg by mouth at bedtime.       Marland Kitchen DISCONTD: arformoterol (BROVANA) 15 MCG/2ML NEBU Take 15 mcg by nebulization 2 (two) times daily.        Marland Kitchen DISCONTD: metoprolol tartrate (LOPRESSOR) 25 MG tablet Take 1 tablet (25 mg total) by mouth 2 (two) times daily.  30 tablet  0  . DISCONTD: pantoprazole (PROTONIX) 40 MG tablet Take 1 tablet (40 mg total) by mouth daily at 12 noon.  10 tablet  0    Allergies  Augmentin; Daliresp; Diltiazem; Montelukast sodium; and Zafirlukast  Electrocardiogram:  Assessment and Plan

## 2012-01-11 NOTE — Assessment & Plan Note (Signed)
Recent echo shows mild AS  Stable F/U echo in a year

## 2012-01-11 NOTE — Telephone Encounter (Signed)
Called and spoke with pt about the message---pt stated that she is on home oxygen and is requesting a smaller tank to carry with her when she goes out.  She is on 3 liters of oxygen a bedtime and some during the day.  Pt is aware that rx for the alprazolam has been called in for her.  CY please advise if ok to send in DME order for smaller tank per pts request.  thanks

## 2012-01-11 NOTE — Telephone Encounter (Signed)
Dr Maple Hudson please advise of pt's 6mw test results, thanks.  6mw flowsheet printed along with message.

## 2012-01-11 NOTE — Assessment & Plan Note (Signed)
Recent hospitalization for respitory failure.  Desats on 6 minute walk test  Needs script for portable oxygen.  F/U Dr Maple Hudson

## 2012-01-11 NOTE — Assessment & Plan Note (Signed)
Well controlled.  Continue current medications and low sodium Dash type diet.    

## 2012-01-11 NOTE — Telephone Encounter (Signed)
Pt is requesting results of SMW. flow sheet printed with message. Pt is also requesting a refill on xanax 0.25mg . Last refill was on 12-14-11 for #30. Please advise. Carron Curie, CMA

## 2012-01-12 NOTE — Telephone Encounter (Signed)
PT informed that order was sent to DME to have smaller of lighter portable tanks sent out.

## 2012-01-12 NOTE — Telephone Encounter (Signed)
Per CY-okay to place order for small or light portable tanks.

## 2012-01-18 NOTE — Telephone Encounter (Signed)
Dr. Maple Hudson, have you looked at these results yet?  Pls advise.  Thank you.

## 2012-01-19 NOTE — Telephone Encounter (Signed)
Her 6 minute walk test showed normal oxygen sat 93% to start, fell to 86% by end of 309 meters, then returned to 95% with rest. Since she has home O2 already, I recommend-  Order DME evaluate for light portable O2 2 L/min   Dx Chronic obstructive asthma

## 2012-01-20 DIAGNOSIS — I4891 Unspecified atrial fibrillation: Secondary | ICD-10-CM | POA: Diagnosis not present

## 2012-01-20 DIAGNOSIS — F329 Major depressive disorder, single episode, unspecified: Secondary | ICD-10-CM | POA: Diagnosis not present

## 2012-01-20 DIAGNOSIS — J45909 Unspecified asthma, uncomplicated: Secondary | ICD-10-CM | POA: Diagnosis not present

## 2012-01-20 DIAGNOSIS — J45901 Unspecified asthma with (acute) exacerbation: Secondary | ICD-10-CM | POA: Diagnosis not present

## 2012-01-20 DIAGNOSIS — J441 Chronic obstructive pulmonary disease with (acute) exacerbation: Secondary | ICD-10-CM | POA: Diagnosis not present

## 2012-01-20 DIAGNOSIS — G35 Multiple sclerosis: Secondary | ICD-10-CM | POA: Diagnosis not present

## 2012-01-20 NOTE — Telephone Encounter (Signed)
I spoke with patient about results and she verbalized understanding and had no questions. Pt has laready been set up with light portable oxygen. Will sign off message

## 2012-01-27 DIAGNOSIS — I10 Essential (primary) hypertension: Secondary | ICD-10-CM | POA: Diagnosis not present

## 2012-01-27 DIAGNOSIS — J449 Chronic obstructive pulmonary disease, unspecified: Secondary | ICD-10-CM | POA: Diagnosis not present

## 2012-01-27 DIAGNOSIS — Z79899 Other long term (current) drug therapy: Secondary | ICD-10-CM | POA: Diagnosis not present

## 2012-01-27 DIAGNOSIS — E785 Hyperlipidemia, unspecified: Secondary | ICD-10-CM | POA: Diagnosis not present

## 2012-01-28 DIAGNOSIS — H6980 Other specified disorders of Eustachian tube, unspecified ear: Secondary | ICD-10-CM | POA: Diagnosis not present

## 2012-01-28 DIAGNOSIS — J309 Allergic rhinitis, unspecified: Secondary | ICD-10-CM | POA: Diagnosis not present

## 2012-01-28 DIAGNOSIS — J069 Acute upper respiratory infection, unspecified: Secondary | ICD-10-CM | POA: Diagnosis not present

## 2012-02-01 ENCOUNTER — Encounter: Payer: Self-pay | Admitting: Internal Medicine

## 2012-02-01 ENCOUNTER — Ambulatory Visit (INDEPENDENT_AMBULATORY_CARE_PROVIDER_SITE_OTHER): Payer: Medicare Other | Admitting: Internal Medicine

## 2012-02-01 VITALS — BP 128/74 | HR 106 | Ht 63.0 in | Wt 135.0 lb

## 2012-02-01 DIAGNOSIS — I359 Nonrheumatic aortic valve disorder, unspecified: Secondary | ICD-10-CM | POA: Diagnosis not present

## 2012-02-01 DIAGNOSIS — I4891 Unspecified atrial fibrillation: Secondary | ICD-10-CM

## 2012-02-01 DIAGNOSIS — J449 Chronic obstructive pulmonary disease, unspecified: Secondary | ICD-10-CM

## 2012-02-01 DIAGNOSIS — I480 Paroxysmal atrial fibrillation: Secondary | ICD-10-CM

## 2012-02-01 NOTE — Patient Instructions (Signed)
Ok to use the portable oxygen and to pace yourself as needed. What you feel you can do will be affected by obvious things like heat and humidity.

## 2012-02-01 NOTE — Progress Notes (Signed)
Patient ID: [name], female    DOB: 1933/07/01, 78 y.o.   MRN: [id number]  HPI 48 yoF former smoker with COPD/ chronic obstructive asthma, marked labile component with recurrent acute bronchitis complicated by  Paroxysmal atrial fib and hx Aortic Stenosis. After bad 2-3 months, she cleared completely with 30 days of azithromycin given February 6. Dr Johnsie Cancel has seen her and felt her murmur is stable. In spite of pollen season she feels very well. Sleeps with O2 every night and uses it as needed in daytime.   04/21/11- 31  yoF former smoker with COPD/ chronic obstructive asthma, marked labile component with recurrent acute bronchitis complicated by  Paroxysmal atrial fib and hx Aortic Stenosis. CXR pending today She called last night with exacerbation and comes in this morning. She called her PCP 6 weeks ago with increased wheeze. Was given azithromycin x 1 month, prednisone taper then 10 mg daily. Could tend garden in heat, but remained tight in chest . In last 2 days much tighter, "full" in chest. Denies fever, sore throat, green, chest pain or swelling.  Arthritis bothering her, stressed by husband who has had another CVA, caring for 7 pets, doing a lot of canning- feels "so tired". Continues O2 2 L/M for sleep and prn.  04/30/11- 04/21/11- 78  yoF former smoker with COPD/ chronic obstructive asthma, marked labile component with recurrent acute bronchitis complicated by  Paroxysmal atrial fib and hx Aortic Stenosis Doesn't feel toxic or infected, but wheeze isn't breaking. Little phlegm. She tapered from 60 to 20 mg prednisone daily. We discussed her improvement during the winter with a prolonged course of azithromycin. We discussed the anti-inflammatory effect attributed to macrolides; also the tocolytic effect of magnesium.   05/18/11-  19  yoF former smoker with COPD/ chronic obstructive asthma, marked labile component with recurrent acute bronchitis complicated by  Paroxysmal atrial fib and hx  Aortic Stenosis She reports feeling "some better" - able now to walk out to garden. No new pain or infection or acute problems.  She has been taking zithromax 1 daily maintenance, and she completed 20 days of manesium. After last burst, prednisone is back now to 10 mg daily.   08/17/11- 34  yoF former smoker with COPD/ chronic obstructive asthma, marked labile component with recurrent acute bronchitis complicated by  Paroxysmal atrial fib and hx Aortic Stenosis She says she had been doing very well. Her family shared a viral syndrome and she got it a week ago. Describes aching low-grade fever nasal congestion fatigue with some cough and wheeze. 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 Her physician gave steroid shot, doxycycline pending today. She had tapered maintenance prednisone 5 mg every other day but increased to 10 mg daily a few days ago. Has also had left cataract surgery.  09/28/11-  72  yoF former smoker with COPD/ chronic obstructive asthma, marked labile component with recurrent acute bronchitis complicated by  Paroxysmal atrial fib and hx Aortic Stenosis Hospital follow up visit. Was hospitalized at [facility] long December 18 with a viral pattern bronchitis, green sputum. Dr. Henrene Pastor had given Z-Pak and we sent Tamiflu. She then got Augmentin plus Levaquin plus prednisone for hospital discharge. "Still not over it" with residual cough of white sputum. She has tapered prednisone back to 10 mg daily. Has a cold sore now on her upper lip which she  is treating. Discharge summary was reviewed. CT scan of chest 09/08/2011 showed COPD and emphysema with bronchitis changes in the right upper lobe, calcified coronary artery plaques. Images reviewed with her.  11/24/11-  5  yoF former smoker with COPD/ chronic obstructive asthma, marked labile component with recurrent acute bronchitis  complicated by  Paroxysmal atrial fib and hx Aortic Stenosis Now on prednisone taper day 5, started at 60 mg daily. Somewhat better. This exacerbation started when she stood out in the cold. Next day she had  yellow postnasal drainage. Now coughing productive green and yellow. No blood no chest pain. Father smoked and died of COPD. We had an end of life discussion. She would favor resuscitation but not sustained life support.  12/22/11- 35  yoF former smoker with COPD/ chronic obstructive asthma, marked labile component with recurrent acute bronchitis complicated by  Paroxysmal atrial fib and hx Aortic Stenosis Hospital follow up visit-hospitalized 12/07/2011 through 12/14/2011 for exacerbation of COPD. On a prednisone taper now at 40 mg daily with maintenance cold 10 mg daily. Using oxygen for sleep. She is not clear about her discharge medications. She was treated for anxiety and dyspnea during the hospitalization, using Xanax and then morphine.  02/01/12- 81  yoF former smoker with COPD/ chronic obstructive asthma, marked labile component with recurrent acute bronchitis complicated by  Paroxysmal atrial fib and hx Aortic Stenosis Good and bad days-breathing is worse with activity. Today she feels well for her. 6 minute walk test 01/05/2012-93%, 86%, 95% on room air. 309 m. She desaturates significantly with exertion, limiting exercise tolerance because of her lung disease. She uses oxygen 3 L/[employer] with oxygen conserving portable. She continues prednisone 10 mg daily maintenance and finds that she cannot go lower.  Review of Systems-see HPI Constitutional:   No weight loss, night sweats, Fevers, chills, + fatigue, lassitude. HEENT:   No headaches,  Difficulty swallowing,  Tooth/dental problems,  Sore throat,                No sneezing, itching, ear ache, +nasal congestion, post nasal drip,  CV:  No chest pain,  Orthopnea, PND, swelling in lower extremities, anasarca, dizziness, palpitations GI  No  heartburn, indigestion, abdominal pain, nausea,   Resp:-No excess mucus,  + mild cough, scant sputum,  No coughing up of blood.  No change in color of mucus.  + wheezing. Skin: no rash or lesions. GU: . MS:  + arthritis pains Psych:  No change in mood or affect. No depression or anxiety.  No memory loss.  Objective:   Physical Exam General- Alert, Oriented, Affect-appropriate, Distress-  none; talkative Skin- cold sore on upper lip. Lymphadenopathy- none Head- atraumatic            Eyes- Gross vision intact, PERRLA, conjunctivae clear secretions            Ears- Hearing, canals normal            Nose- Clear, No-Septal dev, mucus, polyps, erosion, perforation             Throat- Mallampati II , mucosa clear , drainage- none, tonsils- atrophic, hoarse Neck- flexible , trachea midline, no stridor , thyroid nl, carotid no bruit Chest - symmetrical excursion , unlabored           Heart/CV- Feels like RR , no murmur heard , no gallop  , no rub, nl s1 s2  JVD- -none, edema- none, stasis changes- none, varices- none           Lung-  decreased breath sounds, unlabored., cough- none , dullness-none, rub- none.            Chest wall-  Abd-  Br/ Gen/ Rectal- Not done, not indicated Extrem- cyanosis- none, clubbing, none, atrophy- none, strength- nl     +osteoarthritis changes in hands Neuro- grossly intact to observation

## 2012-02-06 NOTE — Assessment & Plan Note (Signed)
She will continue to pace herself and uses her portable oxygen. I have no medication changes to offer.

## 2012-02-06 NOTE — Assessment & Plan Note (Signed)
Heart sounds are muffled by her lungs. I do not hear murmur sitting quietly today we assume aortic stenosis contributes to her exertional dyspnea.

## 2012-02-06 NOTE — Assessment & Plan Note (Signed)
Exam today is consistent with sinus rhythm 

## 2012-02-09 DIAGNOSIS — J45901 Unspecified asthma with (acute) exacerbation: Secondary | ICD-10-CM | POA: Diagnosis not present

## 2012-02-09 DIAGNOSIS — F329 Major depressive disorder, single episode, unspecified: Secondary | ICD-10-CM | POA: Diagnosis not present

## 2012-02-09 DIAGNOSIS — I4891 Unspecified atrial fibrillation: Secondary | ICD-10-CM | POA: Diagnosis not present

## 2012-02-09 DIAGNOSIS — G35 Multiple sclerosis: Secondary | ICD-10-CM | POA: Diagnosis not present

## 2012-02-09 DIAGNOSIS — J441 Chronic obstructive pulmonary disease with (acute) exacerbation: Secondary | ICD-10-CM | POA: Diagnosis not present

## 2012-02-09 DIAGNOSIS — J45909 Unspecified asthma, uncomplicated: Secondary | ICD-10-CM | POA: Diagnosis not present

## 2012-03-04 DIAGNOSIS — J040 Acute laryngitis: Secondary | ICD-10-CM | POA: Diagnosis not present

## 2012-03-07 ENCOUNTER — Telehealth: Payer: Self-pay | Admitting: Internal Medicine

## 2012-03-07 DIAGNOSIS — J029 Acute pharyngitis, unspecified: Secondary | ICD-10-CM | POA: Diagnosis not present

## 2012-03-07 MED ORDER — FIRST-DUKES MOUTHWASH MT SUSP: 5.0000 mL | Freq: Four times a day (QID) | OROMUCOSAL | Status: DC

## 2012-03-07 NOTE — Telephone Encounter (Signed)
Suggest we try [employer]'s Magic Mouthwash    150 ml, swish and swallow 5 ml 4 x daily

## 2012-03-07 NOTE — Telephone Encounter (Signed)
Spoke with pt verbally understood . rx sent to pharmacy .  

## 2012-03-07 NOTE — Telephone Encounter (Signed)
Spoke with patient states PCP put her on zpak for ST and has one more day left. Pt states that her neb meds are drying her out and causing her to have a ST. Denies any  Fever, cough, sob,or wheezing. Pt states feels like razors when she swallows.  Pt would like your recommendations.  Allergies  Allergen Reactions  . Amoxicillin-Pot Clavulanate     GI upset  . Daliresp (Roflumilast) Other (See Comments)    unknown  . Diltiazem Nausea Only  . Montelukast Sodium     REACTION: flu-like symptoms  . Zafirlukast Other (See Comments)    unknown   Dr Maple Hudson please advise Thank you l

## 2012-04-04 ENCOUNTER — Telehealth: Payer: Self-pay | Admitting: Internal Medicine

## 2012-04-04 MED ORDER — PREDNISONE 10 MG PO TABS: ORAL_TABLET | ORAL | Status: DC

## 2012-04-04 NOTE — Telephone Encounter (Signed)
Rx done and patient was made aware at time of call.

## 2012-04-13 ENCOUNTER — Encounter: Payer: Self-pay | Admitting: Internal Medicine

## 2012-04-13 ENCOUNTER — Ambulatory Visit (INDEPENDENT_AMBULATORY_CARE_PROVIDER_SITE_OTHER): Payer: Medicare Other | Admitting: Internal Medicine

## 2012-04-13 VITALS — BP 150/98 | HR 111 | Ht 63.0 in | Wt 137.0 lb

## 2012-04-13 DIAGNOSIS — J449 Chronic obstructive pulmonary disease, unspecified: Secondary | ICD-10-CM

## 2012-04-13 DIAGNOSIS — J309 Allergic rhinitis, unspecified: Secondary | ICD-10-CM

## 2012-04-13 DIAGNOSIS — J4489 Other specified chronic obstructive pulmonary disease: Secondary | ICD-10-CM

## 2012-04-13 MED ORDER — METHYLPREDNISOLONE ACETATE 80 MG/ML IJ SUSP
80.0000 mg | Freq: Once | INTRAMUSCULAR | Status: AC
  Administered 2012-04-13: 80 mg via INTRAMUSCULAR

## 2012-04-13 MED ORDER — DOXYCYCLINE HYCLATE 100 MG PO TABS: ORAL_TABLET | ORAL | Status: DC

## 2012-04-13 NOTE — Progress Notes (Signed)
Patient ID: [name], female    DOB: 1933/07/01, 78 y.o.   MRN: [id number]  HPI 48 yoF former smoker with COPD/ chronic obstructive asthma, marked labile component with recurrent acute bronchitis complicated by  Paroxysmal atrial fib and hx Aortic Stenosis. After bad 2-3 months, she cleared completely with 30 days of azithromycin given February 6. Dr Johnsie Cancel has seen her and felt her murmur is stable. In spite of pollen season she feels very well. Sleeps with O2 every night and uses it as needed in daytime.   04/21/11- 31  yoF former smoker with COPD/ chronic obstructive asthma, marked labile component with recurrent acute bronchitis complicated by  Paroxysmal atrial fib and hx Aortic Stenosis. CXR pending today She called last night with exacerbation and comes in this morning. She called her PCP 6 weeks ago with increased wheeze. Was given azithromycin x 1 month, prednisone taper then 10 mg daily. Could tend garden in heat, but remained tight in chest . In last 2 days much tighter, "full" in chest. Denies fever, sore throat, green, chest pain or swelling.  Arthritis bothering her, stressed by husband who has had another CVA, caring for 7 pets, doing a lot of canning- feels "so tired". Continues O2 2 L/M for sleep and prn.  04/30/11- 04/21/11- 78  yoF former smoker with COPD/ chronic obstructive asthma, marked labile component with recurrent acute bronchitis complicated by  Paroxysmal atrial fib and hx Aortic Stenosis Doesn't feel toxic or infected, but wheeze isn't breaking. Little phlegm. She tapered from 60 to 20 mg prednisone daily. We discussed her improvement during the winter with a prolonged course of azithromycin. We discussed the anti-inflammatory effect attributed to macrolides; also the tocolytic effect of magnesium.   05/18/11-  19  yoF former smoker with COPD/ chronic obstructive asthma, marked labile component with recurrent acute bronchitis complicated by  Paroxysmal atrial fib and hx  Aortic Stenosis She reports feeling "some better" - able now to walk out to garden. No new pain or infection or acute problems.  She has been taking zithromax 1 daily maintenance, and she completed 20 days of manesium. After last burst, prednisone is back now to 10 mg daily.   08/17/11- 34  yoF former smoker with COPD/ chronic obstructive asthma, marked labile component with recurrent acute bronchitis complicated by  Paroxysmal atrial fib and hx Aortic Stenosis She says she had been doing very well. Her family shared a viral syndrome and she got it a week ago. Describes aching low-grade fever nasal congestion fatigue with some cough and wheeze. 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 Her physician gave steroid shot, doxycycline pending today. She had tapered maintenance prednisone 5 mg every other day but increased to 10 mg daily a few days ago. Has also had left cataract surgery.  09/28/11-  72  yoF former smoker with COPD/ chronic obstructive asthma, marked labile component with recurrent acute bronchitis complicated by  Paroxysmal atrial fib and hx Aortic Stenosis Hospital follow up visit. Was hospitalized at [facility] long December 18 with a viral pattern bronchitis, green sputum. Dr. Henrene Pastor had given Z-Pak and we sent Tamiflu. She then got Augmentin plus Levaquin plus prednisone for hospital discharge. "Still not over it" with residual cough of white sputum. She has tapered prednisone back to 10 mg daily. Has a cold sore now on her upper lip which she  is treating. Discharge summary was reviewed. CT scan of chest 09/08/2011 showed COPD and emphysema with bronchitis changes in the right upper lobe, calcified coronary artery plaques. Images reviewed with her.  11/24/11-  54  yoF former smoker with COPD/ chronic obstructive asthma, marked labile component with recurrent acute bronchitis  complicated by  Paroxysmal atrial fib and hx Aortic Stenosis Now on prednisone taper day 5, started at 60 mg daily. Somewhat better. This exacerbation started when she stood out in the cold. Next day she had  yellow postnasal drainage. Now coughing productive green and yellow. No blood no chest pain. Father smoked and died of COPD. We had an end of life discussion. She would favor resuscitation but not sustained life support.  12/22/11- 49  yoF former smoker with COPD/ chronic obstructive asthma, marked labile component with recurrent acute bronchitis complicated by  Paroxysmal atrial fib and hx Aortic Stenosis Hospital follow up visit-hospitalized 12/07/2011 through 12/14/2011 for exacerbation of COPD. On a prednisone taper now at 40 mg daily with maintenance cold 10 mg daily. Using oxygen for sleep. She is not clear about her discharge medications. She was treated for anxiety and dyspnea during the hospitalization, using Xanax and then morphine.  02/01/12- 19  yoF former smoker with COPD/ chronic obstructive asthma, marked labile component with recurrent acute bronchitis complicated by  Paroxysmal atrial fib and hx Aortic Stenosis Good and bad days-breathing is worse with activity. Today she feels well for her. 6 minute walk test 01/05/2012-93%, 86%, 95% on room air. 309 m. She desaturates significantly with exertion, limiting exercise tolerance because of her lung disease. She uses oxygen 3 L/[employer] with oxygen conserving portable. She continues prednisone 10 mg daily maintenance and finds that she cannot go lower.  04/13/12- 41  yoF former smoker with COPD/ chronic obstructive asthma, marked labile component with recurrent acute bronchitis complicated by  Paroxysmal atrial fib and hx Aortic Stenosis  Patient states had a staph infection x 1 month ago. c/o loss of voice, irritated throat, chest congestion, and cough.  Dr. Marina Goodell treated with sulfa drug. She doesn't think pharyngitis is quite gone and  chest is "not as good" with scant productive cough over the past week. Denies fever or purulent sputum. COPD assessment test (CAT) 15/40  Review of Systems-see HPI Constitutional:   No weight loss, night sweats, Fevers, chills, + fatigue, lassitude. HEENT:   No headaches,  Difficulty swallowing,  Tooth/dental problems,  Sore throat,                No sneezing, itching, ear ache, +nasal congestion, post nasal drip,  CV:  No chest pain,  Orthopnea, PND, swelling in lower extremities, anasarca, dizziness, palpitations GI  No heartburn, indigestion, abdominal pain, nausea,   Resp:-No excess mucus,  + mild cough, scant sputum,  No coughing up of blood.  No change in color of mucus.  + wheezing. Skin: no rash or lesions. GU: . MS:  + arthritis pains Psych:  No change in mood or affect. No depression or anxiety.  No memory loss.  Objective:   Physical Exam General- Alert, Oriented, Affect-appropriate, Distress-  none; talkative Skin- cold sore on upper lip. Lymphadenopathy- none with special attention to cervical nodes Head- atraumatic            Eyes- Gross vision intact, PERRLA, conjunctivae clear secretions            Ears- Hearing, canals normal            Nose-  Clear, No-Septal dev, mucus, polyps, erosion, perforation             Throat- Mallampati II , mucosa clear with no inflammation seen , drainage- none, tonsils- atrophic,  Neck- flexible , trachea midline, no stridor , thyroid nl, carotid no bruit Chest - symmetrical excursion , unlabored           Heart/CV- Feels like RR , no murmur heard , no gallop  , no rub, nl s1 s2                            JVD- -none, edema- none, stasis changes- none, varices- none           Lung-  decreased breath sounds, mild wheeze, unlabored., cough- none , dullness-none, rub- none.            Chest wall-  Abd-  Br/ Gen/ Rectal- Not done, not indicated Extrem- cyanosis- none, clubbing, none, atrophy- none, strength- nl     +osteoarthritis changes in  hands Neuro- grossly intact to observation

## 2012-04-13 NOTE — Patient Instructions (Addendum)
HAPPY BIRTHDAY !!  Script sent for doxycycline  Depo 80

## 2012-04-17 NOTE — Assessment & Plan Note (Signed)
Recent mild exacerbation associated with pharyngitis Plan doxycycline, Depo-Medrol She has a strong family history of colon cancer and would like permission to go forward with colonoscopy if stable at the time

## 2012-04-28 DIAGNOSIS — R1011 Right upper quadrant pain: Secondary | ICD-10-CM | POA: Diagnosis not present

## 2012-04-28 DIAGNOSIS — R7989 Other specified abnormal findings of blood chemistry: Secondary | ICD-10-CM | POA: Diagnosis not present

## 2012-04-30 DIAGNOSIS — R109 Unspecified abdominal pain: Secondary | ICD-10-CM | POA: Diagnosis not present

## 2012-05-03 DIAGNOSIS — R1011 Right upper quadrant pain: Secondary | ICD-10-CM | POA: Diagnosis not present

## 2012-05-09 DIAGNOSIS — R1013 Epigastric pain: Secondary | ICD-10-CM | POA: Diagnosis not present

## 2012-05-09 DIAGNOSIS — Z800 Family history of malignant neoplasm of digestive organs: Secondary | ICD-10-CM | POA: Diagnosis not present

## 2012-05-09 DIAGNOSIS — J449 Chronic obstructive pulmonary disease, unspecified: Secondary | ICD-10-CM | POA: Diagnosis not present

## 2012-05-10 ENCOUNTER — Other Ambulatory Visit: Payer: Self-pay | Admitting: Internal Medicine

## 2012-05-10 ENCOUNTER — Telehealth: Payer: Self-pay | Admitting: Internal Medicine

## 2012-05-10 MED ORDER — PREDNISONE 10 MG PO TABS: 10.0000 mg | ORAL_TABLET | Freq: Every day | ORAL | Status: DC

## 2012-05-10 NOTE — Telephone Encounter (Signed)
rx has been sent to the pharmacy for the pt.  Ok to send in refill per Florentina Addison.

## 2012-05-16 DIAGNOSIS — E785 Hyperlipidemia, unspecified: Secondary | ICD-10-CM | POA: Diagnosis not present

## 2012-05-16 DIAGNOSIS — J019 Acute sinusitis, unspecified: Secondary | ICD-10-CM | POA: Diagnosis not present

## 2012-05-16 DIAGNOSIS — J449 Chronic obstructive pulmonary disease, unspecified: Secondary | ICD-10-CM | POA: Diagnosis not present

## 2012-05-16 DIAGNOSIS — I10 Essential (primary) hypertension: Secondary | ICD-10-CM | POA: Diagnosis not present

## 2012-05-31 ENCOUNTER — Telehealth: Payer: Self-pay | Admitting: Internal Medicine

## 2012-05-31 MED ORDER — PREDNISONE 10 MG PO TABS: ORAL_TABLET | ORAL | Status: DC

## 2012-05-31 NOTE — Telephone Encounter (Signed)
Called, spoke with pt.  I informed her of below per Dr. Maple Hudson.  She verbalized understanding of this and will need prednisone rx.  This has been sent to [facility] in [place].  Pt aware and will call back if symptoms do not improve or worsen and was advised to seek emergency care if needed.

## 2012-05-31 NOTE — Telephone Encounter (Signed)
Per CY-have patient take her prednisone 10 mg tablets at 60 mg daily, 50 mg daily, 40 mg daily, 30 mg daily, 20 mg daily then 10 mg daily; if patient needs more pills to do this regimen then okay to refill Prednisone 10 mg #100 no refills.

## 2012-05-31 NOTE — Telephone Encounter (Signed)
Spoke with pt. She states having increased SOB, chest tightness and chest congestion x 2 days. She states that she has had no cough or sputum production. No f/c/s. Would like something called in. Last visit with CDY 04/13/12. Please advise, thanks! Allergies  Allergen Reactions  . Amoxicillin-Pot Clavulanate     GI upset  . Daliresp (Roflumilast) Other (See Comments)    unknown  . Diltiazem Nausea Only  . Montelukast Sodium     REACTION: flu-like symptoms  . Zafirlukast Other (See Comments)    unknown

## 2012-06-01 DIAGNOSIS — Z23 Encounter for immunization: Secondary | ICD-10-CM | POA: Diagnosis not present

## 2012-06-06 ENCOUNTER — Telehealth: Payer: Self-pay | Admitting: Internal Medicine

## 2012-06-06 NOTE — Telephone Encounter (Signed)
[name], CMA 06/06/2012 2:02 PM Signed  Per CY-try using just levalbuerol for next 2-3 nebs, see how this feels.   I spoke with this again. She had wrote down the wrong name earlier. She read back to me what I advised her of. She had no further questions

## 2012-06-06 NOTE — Telephone Encounter (Signed)
Pt aware. Randen Kauth, CMA  

## 2012-06-06 NOTE — Telephone Encounter (Signed)
Per CY-try using just levalbuerol for next 2-3 nebs, see how this feels.

## 2012-06-06 NOTE — Telephone Encounter (Signed)
I spoke with pt and she stated she mixed her ipratropium and levalbuterol together and does this 3 times a day. After she does this she states she wheezes very bad and has this feeling where she can't breathe. She has noticed this x 1 week now. When she uses her budesonide she is fine. Please advise Dr. Maple Hudson thanks

## 2012-06-09 ENCOUNTER — Telehealth: Payer: Self-pay | Admitting: Internal Medicine

## 2012-06-09 MED ORDER — CEFDINIR 300 MG PO CAPS: 300.0000 mg | ORAL_CAPSULE | Freq: Two times a day (BID) | ORAL | Status: DC

## 2012-06-09 NOTE — Telephone Encounter (Signed)
Per CY-omnicef 300mg  #20 take 1 po bid no refills and does she need more prednisone?

## 2012-06-09 NOTE — Telephone Encounter (Signed)
Spoke with pt. She is c/o increased SOB, chest tightness, cough-dry, and wheezing x 1 wk- worse fro the past 3 days. She states had sinus infection x 3 wks ago and was txed per PCP with doxy x 6 days. Would like something called in. Please advise, thanks! Last ov 04/13/12 Next 07/12/12  Allergies  Allergen Reactions  . Amoxicillin-Pot Clavulanate     GI upset  . Daliresp (Roflumilast) Other (See Comments)    unknown  . Diltiazem Nausea Only  . Montelukast Sodium     REACTION: flu-like symptoms  . Zafirlukast Other (See Comments)    unknown

## 2012-06-09 NOTE — Telephone Encounter (Signed)
Spoke with pt and notified of recs per CDY. She verbalized understanding. States does not need any more prednisone, has this at home and knows how to take if she feels that she needs this.

## 2012-06-16 ENCOUNTER — Telehealth: Payer: Self-pay | Admitting: Internal Medicine

## 2012-06-16 MED ORDER — PREDNISONE 10 MG PO TABS: ORAL_TABLET | ORAL | Status: DC

## 2012-06-16 NOTE — Telephone Encounter (Signed)
Spoke with pt and notified of recs per CDY. She verbalized understanding. Rx refill for prednisone was sent

## 2012-06-16 NOTE — Telephone Encounter (Signed)
Finish current antibiotic Meanwhile, increase prednisone to 60 mg daily x 3 days, 40 mg x 3 days, 20 mg x 3 days, then back to maintenance 10 mg daily. Ok to refill her script for prednisone 10 mg if needed.

## 2012-06-16 NOTE — Telephone Encounter (Signed)
I spoke with pt and she c/o wheezing, chest tx, cough w/ very little phlem (not sure what color), blowing out green phlem, spells of SOB that comes and goes per pt. Pt has 3 days left of cefdnir 300 mg BID. Pt is requesting further recs. Please advise Dr. Maple Hudson thanks  Allergies  Allergen Reactions  . Amoxicillin-Pot Clavulanate     GI upset  . Daliresp (Roflumilast) Other (See Comments)    unknown  . Diltiazem Nausea Only  . Montelukast Sodium     REACTION: flu-like symptoms  . Zafirlukast Other (See Comments)    unknown

## 2012-07-06 DIAGNOSIS — Z800 Family history of malignant neoplasm of digestive organs: Secondary | ICD-10-CM | POA: Diagnosis not present

## 2012-07-06 DIAGNOSIS — R1013 Epigastric pain: Secondary | ICD-10-CM | POA: Diagnosis not present

## 2012-07-06 DIAGNOSIS — J449 Chronic obstructive pulmonary disease, unspecified: Secondary | ICD-10-CM | POA: Diagnosis not present

## 2012-07-12 ENCOUNTER — Ambulatory Visit (INDEPENDENT_AMBULATORY_CARE_PROVIDER_SITE_OTHER): Payer: Medicare Other | Admitting: Internal Medicine

## 2012-07-12 ENCOUNTER — Encounter: Payer: Self-pay | Admitting: Internal Medicine

## 2012-07-12 VITALS — BP 130/74 | HR 111 | Ht 63.5 in | Wt 142.0 lb

## 2012-07-12 DIAGNOSIS — J449 Chronic obstructive pulmonary disease, unspecified: Secondary | ICD-10-CM

## 2012-07-12 DIAGNOSIS — K219 Gastro-esophageal reflux disease without esophagitis: Secondary | ICD-10-CM

## 2012-07-12 NOTE — Patient Instructions (Addendum)
We don't need to make changes today.  Please call as needed.

## 2012-07-12 NOTE — Progress Notes (Signed)
Patient ID: [name], female    DOB: 1933/07/01, 78 y.o.   MRN: [id number]  HPI 48 yoF former smoker with COPD/ chronic obstructive asthma, marked labile component with recurrent acute bronchitis complicated by  Paroxysmal atrial fib and hx Aortic Stenosis. After bad 2-3 months, she cleared completely with 30 days of azithromycin given February 6. Dr Johnsie Cancel has seen her and felt her murmur is stable. In spite of pollen season she feels very well. Sleeps with O2 every night and uses it as needed in daytime.   04/21/11- 31  yoF former smoker with COPD/ chronic obstructive asthma, marked labile component with recurrent acute bronchitis complicated by  Paroxysmal atrial fib and hx Aortic Stenosis. CXR pending today She called last night with exacerbation and comes in this morning. She called her PCP 6 weeks ago with increased wheeze. Was given azithromycin x 1 month, prednisone taper then 10 mg daily. Could tend garden in heat, but remained tight in chest . In last 2 days much tighter, "full" in chest. Denies fever, sore throat, green, chest pain or swelling.  Arthritis bothering her, stressed by husband who has had another CVA, caring for 7 pets, doing a lot of canning- feels "so tired". Continues O2 2 L/M for sleep and prn.  04/30/11- 04/21/11- 78  yoF former smoker with COPD/ chronic obstructive asthma, marked labile component with recurrent acute bronchitis complicated by  Paroxysmal atrial fib and hx Aortic Stenosis Doesn't feel toxic or infected, but wheeze isn't breaking. Little phlegm. She tapered from 60 to 20 mg prednisone daily. We discussed her improvement during the winter with a prolonged course of azithromycin. We discussed the anti-inflammatory effect attributed to macrolides; also the tocolytic effect of magnesium.   05/18/11-  19  yoF former smoker with COPD/ chronic obstructive asthma, marked labile component with recurrent acute bronchitis complicated by  Paroxysmal atrial fib and hx  Aortic Stenosis She reports feeling "some better" - able now to walk out to garden. No new pain or infection or acute problems.  She has been taking zithromax 1 daily maintenance, and she completed 20 days of manesium. After last burst, prednisone is back now to 10 mg daily.   08/17/11- 34  yoF former smoker with COPD/ chronic obstructive asthma, marked labile component with recurrent acute bronchitis complicated by  Paroxysmal atrial fib and hx Aortic Stenosis She says she had been doing very well. Her family shared a viral syndrome and she got it a week ago. Describes aching low-grade fever nasal congestion fatigue with some cough and wheeze. 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 Her physician gave steroid shot, doxycycline pending today. She had tapered maintenance prednisone 5 mg every other day but increased to 10 mg daily a few days ago. Has also had left cataract surgery.  09/28/11-  72  yoF former smoker with COPD/ chronic obstructive asthma, marked labile component with recurrent acute bronchitis complicated by  Paroxysmal atrial fib and hx Aortic Stenosis Hospital follow up visit. Was hospitalized at [facility] long December 18 with a viral pattern bronchitis, green sputum. Dr. Henrene Pastor had given Z-Pak and we sent Tamiflu. She then got Augmentin plus Levaquin plus prednisone for hospital discharge. "Still not over it" with residual cough of white sputum. She has tapered prednisone back to 10 mg daily. Has a cold sore now on her upper lip which she  is treating. Discharge summary was reviewed. CT scan of chest 09/08/2011 showed COPD and emphysema with bronchitis changes in the right upper lobe, calcified coronary artery plaques. Images reviewed with her.  11/24/11-  69  yoF former smoker with COPD/ chronic obstructive asthma, marked labile component with recurrent acute bronchitis  complicated by  Paroxysmal atrial fib and hx Aortic Stenosis Now on prednisone taper day 5, started at 60 mg daily. Somewhat better. This exacerbation started when she stood out in the cold. Next day she had  yellow postnasal drainage. Now coughing productive green and yellow. No blood no chest pain. Father smoked and died of COPD. We had an end of life discussion. She would favor resuscitation but not sustained life support.  12/22/11- 79  yoF former smoker with COPD/ chronic obstructive asthma, marked labile component with recurrent acute bronchitis complicated by  Paroxysmal atrial fib and hx Aortic Stenosis Hospital follow up visit-hospitalized 12/07/2011 through 12/14/2011 for exacerbation of COPD. On a prednisone taper now at 40 mg daily with maintenance cold 10 mg daily. Using oxygen for sleep. She is not clear about her discharge medications. She was treated for anxiety and dyspnea during the hospitalization, using Xanax and then morphine.  02/01/12- 29  yoF former smoker with COPD/ chronic obstructive asthma, marked labile component with recurrent acute bronchitis complicated by  Paroxysmal atrial fib and hx Aortic Stenosis Good and bad days-breathing is worse with activity. Today she feels well for her. 6 minute walk test 01/05/2012-93%, 86%, 95% on room air. 309 m. She desaturates significantly with exertion, limiting exercise tolerance because of her lung disease. She uses oxygen 3 L/[employer] with oxygen conserving portable. She continues prednisone 10 mg daily maintenance and finds that she cannot go lower.  04/13/12- 8  yoF former smoker with COPD/ chronic obstructive asthma, marked labile component with recurrent acute bronchitis complicated by  Paroxysmal atrial fib and hx Aortic Stenosis  Patient states had a staph infection x 1 month ago. c/o loss of voice, irritated throat, chest congestion, and cough.  Dr. Marina Goodell treated with sulfa drug. She doesn't think pharyngitis is quite gone and  chest is "not as good" with scant productive cough over the past week. Denies fever or purulent sputum. COPD assessment test (CAT) 15/40  07/12/12-79  yoF former smoker with COPD/ chronic obstructive asthma, marked labile component with recurrent acute bronchitis complicated by  Paroxysmal atrial fib and hx Aortic Stenosis  Pt states symptoms have not changed--off and on--Pt c/o increased reflux and pain in upper stomach; Rxd Omeprazole 20mg  1 bid, given some relief.  Omnicef in September helped sinusitis at that time. Has had flu vaccine. Back down to maintenance prednisone 10 mg daily for the past week after needing a burst and taper. COPD assessment test (CAT) score 15/40  Review of Systems-see HPI Constitutional:   No weight loss, night sweats, Fevers, chills, + fatigue, lassitude. HEENT:   No headaches,  Difficulty swallowing,  Tooth/dental problems,  Sore throat,                No sneezing, itching, ear ache, +nasal congestion, post nasal drip,  CV:  No chest pain,  Orthopnea, PND, swelling in lower extremities, anasarca, dizziness, palpitations GI  No heartburn, indigestion, +abdominal pain, nausea,   Resp:-No excess mucus,  + mild cough, scant sputum,  No coughing up of blood.  No change in color of mucus.  + wheezing. Skin: no rash or lesions. GU: . MS:  + arthritis pains Psych:  No change in mood or affect. No depression or anxiety.  No memory loss.  Objective:   Physical Exam General- Alert, Oriented, Affect-appropriate, Distress-  none; +talkative. Room air O2 sat 945 Skin- cold sore on upper lip. Lymphadenopathy- none with special attention to cervical nodes Head- atraumatic            Eyes- Gross vision intact, PERRLA, conjunctivae clear secretions            Ears- Hearing, canals normal            Nose- Clear, No-Septal dev, mucus, polyps, erosion, perforation             Throat- Mallampati II , mucosa clear with no inflammation seen , drainage- none, tonsils- atrophic,    Neck- flexible , trachea midline, no stridor , thyroid nl, carotid no bruit Chest - symmetrical excursion , unlabored           Heart/CV- Feels like RR , no murmur heard , no gallop  , no rub, nl s1 s2                            JVD- -none, edema- none, stasis changes- none, varices- none           Lung-  +decreased breath sounds, no- wheeze, unlabored., cough- none , dullness-none, rub- none.            Chest wall-  Abd-  Br/ Gen/ Rectal- Not done, not indicated Extrem- cyanosis- none, clubbing, none, atrophy- none, strength- nl     +osteoarthritis changes in hands Neuro- grossly intact to observation

## 2012-07-19 ENCOUNTER — Telehealth: Payer: Self-pay | Admitting: Internal Medicine

## 2012-07-19 MED ORDER — PREDNISONE 10 MG PO TABS: ORAL_TABLET | ORAL | Status: DC

## 2012-07-19 NOTE — Telephone Encounter (Signed)
I spoke with pt and she c/o increase SOB at rest/ exertion--can walk only couple feet w/o gasping for air, chest feels really tight, wheezing, dry cough x 3 days and getting worse. She is using 3 liters oxygen, taking prednisone 10 mg daily, using her inhalers/nebulizer medications. Pt wants something called in before she ends up back in the hospital. Please advise Dr. Maple Hudson thanks  Last OV 07/12/12 Pending 10/13/11  Allergies  Allergen Reactions  . Amoxicillin-Pot Clavulanate     GI upset  . Daliresp (Roflumilast) Other (See Comments)    unknown  . Diltiazem Nausea Only  . Montelukast Sodium     REACTION: flu-like symptoms  . Zafirlukast Other (See Comments)    unknown

## 2012-07-19 NOTE — Telephone Encounter (Signed)
I spoke with pt and is aware of CDY recs. She stated to send RX into the pharmacy so she will have enough prednisone. I have sent this to the pharmacy. Nothing further was needed

## 2012-07-19 NOTE — Telephone Encounter (Signed)
Suggest prednisone 10 mg to take 6 daily x 3 days, 5 daily x 3 days, 4 x 3 days, 2 x 3 days, then back to one daily  If she doesn't have enough, send prednisone 10 mg, # 100, with these directions.

## 2012-07-23 NOTE — Assessment & Plan Note (Signed)
We reviewed again the potential for reflux and aspiration to impact her lung disease, emphasizing the importance of working closely with her gastroenterologist.

## 2012-07-23 NOTE — Assessment & Plan Note (Signed)
After recent burst of steroids she is now doing relatively well. Plan-continue prednisone 10 mg daily for maintenance. We reviewed steroid side effects.

## 2012-08-10 ENCOUNTER — Telehealth: Payer: Self-pay | Admitting: Internal Medicine

## 2012-08-10 NOTE — Telephone Encounter (Signed)
Spoke with patient-having sinus troubles, wheezing, SOB , nasal drainage and getting worse. Pt is aware appt made with CY Thursday 08-11-12 at 2:30pm.

## 2012-08-11 ENCOUNTER — Ambulatory Visit (INDEPENDENT_AMBULATORY_CARE_PROVIDER_SITE_OTHER): Payer: Medicare Other | Admitting: Internal Medicine

## 2012-08-11 ENCOUNTER — Encounter: Payer: Self-pay | Admitting: Internal Medicine

## 2012-08-11 VITALS — BP 146/82 | HR 127 | Ht 63.5 in | Wt 139.2 lb

## 2012-08-11 DIAGNOSIS — J441 Chronic obstructive pulmonary disease with (acute) exacerbation: Secondary | ICD-10-CM

## 2012-08-11 MED ORDER — LEVALBUTEROL HCL 0.63 MG/3ML IN NEBU
0.6300 mg | INHALATION_SOLUTION | Freq: Once | RESPIRATORY_TRACT | Status: AC
  Administered 2012-08-11: 0.63 mg via RESPIRATORY_TRACT

## 2012-08-11 MED ORDER — CLARITHROMYCIN 500 MG PO TABS: ORAL_TABLET | ORAL | Status: DC

## 2012-08-11 MED ORDER — METHYLPREDNISOLONE ACETATE 80 MG/ML IJ SUSP
80.0000 mg | Freq: Once | INTRAMUSCULAR | Status: AC
  Administered 2012-08-11: 80 mg via INTRAMUSCULAR

## 2012-08-11 MED ORDER — ARFORMOTEROL TARTRATE 15 MCG/2ML IN NEBU: 15.0000 ug | INHALATION_SOLUTION | Freq: Two times a day (BID) | RESPIRATORY_TRACT | Status: DC

## 2012-08-11 NOTE — Patient Instructions (Addendum)
Samples of Brovana long lasting neb solution to try instead of Perforomist/ formoterol       1 every 12 hours if needed  Burst and taper your prednisone- I would go up at least to 20 mg daily- higher if you think you need to. As you get better you can gradually come back down again  Script for Biaxin antibiotic sent.   Neb xop 0.63  Depo 80

## 2012-08-11 NOTE — Progress Notes (Signed)
Patient ID: Joellyn Rued, female    DOB: 1933/07/01, 78 y.o.   MRN: [id number]  HPI 48 yoF former smoker with COPD/ chronic obstructive asthma, marked labile component with recurrent acute bronchitis complicated by  Paroxysmal atrial fib and hx Aortic Stenosis. After bad 2-3 months, she cleared completely with 30 days of azithromycin given February 6. Dr Johnsie Cancel has seen her and felt her murmur is stable. In spite of pollen season she feels very well. Sleeps with O2 every night and uses it as needed in daytime.   04/21/11- 31  yoF former smoker with COPD/ chronic obstructive asthma, marked labile component with recurrent acute bronchitis complicated by  Paroxysmal atrial fib and hx Aortic Stenosis. CXR pending today She called last night with exacerbation and comes in this morning. She called her PCP 6 weeks ago with increased wheeze. Was given azithromycin x 1 month, prednisone taper then 10 mg daily. Could tend garden in heat, but remained tight in chest . In last 2 days much tighter, "full" in chest. Denies fever, sore throat, green, chest pain or swelling.  Arthritis bothering her, stressed by husband who has had another CVA, caring for 7 pets, doing a lot of canning- feels "so tired". Continues O2 2 L/M for sleep and prn.  04/30/11- 04/21/11- 78  yoF former smoker with COPD/ chronic obstructive asthma, marked labile component with recurrent acute bronchitis complicated by  Paroxysmal atrial fib and hx Aortic Stenosis Doesn't feel toxic or infected, but wheeze isn't breaking. Little phlegm. She tapered from 60 to 20 mg prednisone daily. We discussed her improvement during the winter with a prolonged course of azithromycin. We discussed the anti-inflammatory effect attributed to macrolides; also the tocolytic effect of magnesium.   05/18/11-  19  yoF former smoker with COPD/ chronic obstructive asthma, marked labile component with recurrent acute bronchitis complicated by  Paroxysmal atrial fib and hx  Aortic Stenosis She reports feeling "some better" - able now to walk out to garden. No new pain or infection or acute problems.  She has been taking zithromax 1 daily maintenance, and she completed 20 days of manesium. After last burst, prednisone is back now to 10 mg daily.   08/17/11- 34  yoF former smoker with COPD/ chronic obstructive asthma, marked labile component with recurrent acute bronchitis complicated by  Paroxysmal atrial fib and hx Aortic Stenosis She says she had been doing very well. Her family shared a viral syndrome and she got it a week ago. Describes aching low-grade fever nasal congestion fatigue with some cough and wheeze. 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 Her physician gave steroid shot, doxycycline pending today. She had tapered maintenance prednisone 5 mg every other day but increased to 10 mg daily a few days ago. Has also had left cataract surgery.  09/28/11-  72  yoF former smoker with COPD/ chronic obstructive asthma, marked labile component with recurrent acute bronchitis complicated by  Paroxysmal atrial fib and hx Aortic Stenosis Hospital follow up visit. Was hospitalized at [facility] long December 18 with a viral pattern bronchitis, green sputum. Dr. Henrene Pastor had given Z-Pak and we sent Tamiflu. She then got Augmentin plus Levaquin plus prednisone for hospital discharge. "Still not over it" with residual cough of white sputum. She has tapered prednisone back to 10 mg daily. Has a cold sore now on her upper lip which she  is treating. Discharge summary was reviewed. CT scan of chest 09/08/2011 showed COPD and emphysema with bronchitis changes in the right upper lobe, calcified coronary artery plaques. Images reviewed with her.  11/24/11-  11  yoF former smoker with COPD/ chronic obstructive asthma, marked labile component with recurrent acute bronchitis  complicated by  Paroxysmal atrial fib and hx Aortic Stenosis Now on prednisone taper day 5, started at 60 mg daily. Somewhat better. This exacerbation started when she stood out in the cold. Next day she had  yellow postnasal drainage. Now coughing productive green and yellow. No blood no chest pain. Father smoked and died of COPD. We had an end of life discussion. She would favor resuscitation but not sustained life support.  12/22/11- 68  yoF former smoker with COPD/ chronic obstructive asthma, marked labile component with recurrent acute bronchitis complicated by  Paroxysmal atrial fib and hx Aortic Stenosis Hospital follow up visit-hospitalized 12/07/2011 through 12/14/2011 for exacerbation of COPD. On a prednisone taper now at 40 mg daily with maintenance cold 10 mg daily. Using oxygen for sleep. She is not clear about her discharge medications. She was treated for anxiety and dyspnea during the hospitalization, using Xanax and then morphine.  02/01/12- 54  yoF former smoker with COPD/ chronic obstructive asthma, marked labile component with recurrent acute bronchitis complicated by  Paroxysmal atrial fib and hx Aortic Stenosis Good and bad days-breathing is worse with activity. Today she feels well for her. 6 minute walk test 01/05/2012-93%, 86%, 95% on room air. 309 m. She desaturates significantly with exertion, limiting exercise tolerance because of her lung disease. She uses oxygen 3 L/[employer] with oxygen conserving portable. She continues prednisone 10 mg daily maintenance and finds that she cannot go lower.  04/13/12- 76  yoF former smoker with COPD/ chronic obstructive asthma, marked labile component with recurrent acute bronchitis complicated by  Paroxysmal atrial fib and hx Aortic Stenosis  Patient states had a staph infection x 1 month ago. c/o loss of voice, irritated throat, chest congestion, and cough.  Dr. Marina Goodell treated with sulfa drug. She doesn't think pharyngitis is quite gone and  chest is "not as good" with scant productive cough over the past week. Denies fever or purulent sputum. COPD assessment test (CAT) 15/40  07/12/12-79  yoF former smoker with COPD/ chronic obstructive asthma, marked labile component with recurrent acute bronchitis complicated by  Paroxysmal atrial fib and hx Aortic Stenosis  Pt states symptoms have not changed--off and on--Pt c/o increased reflux and pain in upper stomach; Rxd Omeprazole 20mg  1 bid, given some relief.  Omnicef in September helped sinusitis at that time. Has had flu vaccine. Back down to maintenance prednisone 10 mg daily for the past week after needing a burst and taper. COPD assessment test (CAT) score 15/40  08/11/12- 79  yoF former smoker with COPD/ chronic obstructive asthma, marked labile component with recurrent acute bronchitis complicated by  Paroxysmal atrial fib and hx Aortic Stenosis Acute visit: started getting sick Monday-woke up with sneezing(green in color) drainage, unable to breathe-had to use rescue inhaler prior to nebulizer tx's to feel able to catch breath. Wheezing has increased.  green mucus from nose, yellow mucus from throat. Sneezing. Has been on maintenance prednisone 5 mg. Thinks nebulizing with Perforomist was irritating and made her worse.  Review of Systems-see HPI Constitutional:   No weight loss, night sweats, Fevers, chills, + fatigue, lassitude. HEENT:   No headaches,  Difficulty swallowing,  Tooth/dental problems,  Sore throat,                +  sneezing, itching, ear ache, +nasal congestion, post nasal drip,  CV:  No chest pain,  Orthopnea, PND, swelling in lower extremities, anasarca, dizziness, palpitations GI  No heartburn, indigestion, abdominal pain, nausea,   Resp:-No excess mucus,  +  Cough  sputum,  No coughing up of blood.  No change in color of mucus.  + wheezing. Skin: no rash or lesions. GU: . MS:  + arthritis pains Psych:  No change in mood or affect. No depression or anxiety.   No memory loss.  Objective:   Physical Exam BP 146/82  Pulse 127  Ht 5' 3.5" (1.613 m)  Wt 63.141 kg (139 lb 3.2 oz)  BMI 24.27 kg/m2  SpO2 93% General- Alert, Oriented, Affect-appropriate, Distress-  none; +talkative.  Skin- cold sore on upper lip. Lymphadenopathy- none with special attention to cervical nodes Head- atraumatic            Eyes- Gross vision intact, PERRLA, conjunctivae clear secretions            Ears- Hearing, canals normal            Nose- Clear, No-Septal dev, mucus, polyps, erosion, perforation             Throat- Mallampati II , mucosa clear with no inflammation seen , drainage- none, tonsils- atrophic,  Neck- flexible , trachea midline, no stridor , thyroid nl, carotid no bruit Chest - symmetrical excursion , unlabored           Heart/CV- Feels like RR , no murmur heard , no gallop  , no rub, nl s1 s2                            JVD- -none, edema- none, stasis changes- none, varices- none           Lung-  +decreased breath sounds, no- wheeze, unlabored., cough- none , dullness-none, rub- none.            Chest wall-  Abd-  Br/ Gen/ Rectal- Not done, not indicated Extrem- cyanosis- none, clubbing, none, atrophy- none, strength- nl     +osteoarthritis changes in hands Neuro- grossly intact to observation

## 2012-08-21 NOTE — Assessment & Plan Note (Signed)
She feels that exacerbation building. Not severe yet but she is very afraid of what it can become. Plan-Biaxin, steroid burst, sample of Brovana nebulizer solution to try

## 2012-08-22 DIAGNOSIS — Z1231 Encounter for screening mammogram for malignant neoplasm of breast: Secondary | ICD-10-CM | POA: Diagnosis not present

## 2012-08-29 DIAGNOSIS — Z79899 Other long term (current) drug therapy: Secondary | ICD-10-CM | POA: Diagnosis not present

## 2012-08-29 DIAGNOSIS — E782 Mixed hyperlipidemia: Secondary | ICD-10-CM | POA: Diagnosis not present

## 2012-08-29 DIAGNOSIS — G35 Multiple sclerosis: Secondary | ICD-10-CM | POA: Diagnosis not present

## 2012-08-29 DIAGNOSIS — J449 Chronic obstructive pulmonary disease, unspecified: Secondary | ICD-10-CM | POA: Diagnosis not present

## 2012-08-29 DIAGNOSIS — E785 Hyperlipidemia, unspecified: Secondary | ICD-10-CM | POA: Diagnosis not present

## 2012-08-29 DIAGNOSIS — I10 Essential (primary) hypertension: Secondary | ICD-10-CM | POA: Diagnosis not present

## 2012-08-30 ENCOUNTER — Telehealth: Payer: Self-pay | Admitting: Internal Medicine

## 2012-08-30 MED ORDER — PREDNISONE 10 MG PO TABS: ORAL_TABLET | ORAL | Status: DC

## 2012-08-30 NOTE — Telephone Encounter (Signed)
Per CY-Jump in with Prednisone now and try to stop this; Prednisone 60 x 2 50 x 3 40 x 3 30 x 3 20 x 3 give 10 mg tablets #100 with sig as previously stated and also include or as directed on sig.

## 2012-08-30 NOTE — Telephone Encounter (Signed)
Instruction is actually prednisone 10mg  take 60x3, 50x3, 40x3, 30x3, 20x3. Rx sent. Pt is aware.Carron Curie, CMA

## 2012-08-30 NOTE — Telephone Encounter (Signed)
Spoke to [name] and she states that the wheezing and cough started about 2 days ago. Cough is non productive. She wants to know what Dr. Maple Hudson suggests that she do. Please advise.

## 2012-08-31 ENCOUNTER — Telehealth: Payer: Self-pay | Admitting: Internal Medicine

## 2012-08-31 NOTE — Telephone Encounter (Signed)
Called and gave verbal to [facility] as they did not have RX there for patient; Spoke with patient and she is aware that Rx is at pharmacy.

## 2012-09-02 ENCOUNTER — Telehealth: Payer: Self-pay | Admitting: Internal Medicine

## 2012-09-02 NOTE — Telephone Encounter (Signed)
Called and spoke with pt and she is aware of CY recs.  Pt stated that she has another rx of the prednisone at the pharmacy that she is going to pick up today.  No refill needed at this time.  Pt to call back for any other questions or concerns.

## 2012-09-02 NOTE — Telephone Encounter (Signed)
I would have her stay on maintenance prednisone for several days- 30 mg daily- then start tapering when this spell eases.  We may need to order prednisone 10 mg, # 30, 3 daily or as directed to be sure she has enough.

## 2012-09-02 NOTE — Telephone Encounter (Signed)
Per pt's chart, pred taper was sent on 12.10.13:  Call Documentation     Carron Curie, [facility] 08/30/2012 5:34 PM Signed  Instruction is actually prednisone 10mg  take 60x3, 50x3, 40x3, 30x3, 20x3. Rx sent. Pt is aware.Carron Curie, CMA   Called spoke with patient who reported that despite beginning the pred taper on 12.10.13, her wheezing is "not much better" but her cough is looser and SOB is improved.  Still having some tightness in chest.  Pt unable to   Pt is requesting CY's recs.  Please advise, thanks.

## 2012-09-29 DIAGNOSIS — S93409A Sprain of unspecified ligament of unspecified ankle, initial encounter: Secondary | ICD-10-CM | POA: Diagnosis not present

## 2012-09-29 DIAGNOSIS — M7989 Other specified soft tissue disorders: Secondary | ICD-10-CM | POA: Diagnosis not present

## 2012-10-05 DIAGNOSIS — J029 Acute pharyngitis, unspecified: Secondary | ICD-10-CM | POA: Diagnosis not present

## 2012-10-05 DIAGNOSIS — J449 Chronic obstructive pulmonary disease, unspecified: Secondary | ICD-10-CM | POA: Diagnosis not present

## 2012-10-12 ENCOUNTER — Encounter: Payer: Self-pay | Admitting: Internal Medicine

## 2012-10-12 ENCOUNTER — Ambulatory Visit (INDEPENDENT_AMBULATORY_CARE_PROVIDER_SITE_OTHER): Payer: Medicare Other | Admitting: Internal Medicine

## 2012-10-12 VITALS — BP 152/70 | HR 95 | Ht 63.0 in | Wt 134.4 lb

## 2012-10-12 DIAGNOSIS — J449 Chronic obstructive pulmonary disease, unspecified: Secondary | ICD-10-CM

## 2012-10-12 NOTE — Progress Notes (Signed)
Patient ID: [name], female    DOB: 1933/07/01, 79 y.o.   MRN: [id number]  HPI 48 yoF former smoker with COPD/ chronic obstructive asthma, marked labile component with recurrent acute bronchitis complicated by  Paroxysmal atrial fib and hx Aortic Stenosis. After bad 2-3 months, she cleared completely with 30 days of azithromycin given February 6. Dr Johnsie Cancel has seen her and felt her murmur is stable. In spite of pollen season she feels very well. Sleeps with O2 every night and uses it as needed in daytime.   04/21/11- 31  yoF former smoker with COPD/ chronic obstructive asthma, marked labile component with recurrent acute bronchitis complicated by  Paroxysmal atrial fib and hx Aortic Stenosis. CXR pending today She called last night with exacerbation and comes in this morning. She called her PCP 6 weeks ago with increased wheeze. Was given azithromycin x 1 month, prednisone taper then 10 mg daily. Could tend garden in heat, but remained tight in chest . In last 2 days much tighter, "full" in chest. Denies fever, sore throat, green, chest pain or swelling.  Arthritis bothering her, stressed by husband who has had another CVA, caring for 7 pets, doing a lot of canning- feels "so tired". Continues O2 2 L/M for sleep and prn.  04/30/11- 04/21/11- 78  yoF former smoker with COPD/ chronic obstructive asthma, marked labile component with recurrent acute bronchitis complicated by  Paroxysmal atrial fib and hx Aortic Stenosis Doesn't feel toxic or infected, but wheeze isn't breaking. Little phlegm. She tapered from 60 to 20 mg prednisone daily. We discussed her improvement during the winter with a prolonged course of azithromycin. We discussed the anti-inflammatory effect attributed to macrolides; also the tocolytic effect of magnesium.   05/18/11-  19  yoF former smoker with COPD/ chronic obstructive asthma, marked labile component with recurrent acute bronchitis complicated by  Paroxysmal atrial fib and hx  Aortic Stenosis She reports feeling "some better" - able now to walk out to garden. No new pain or infection or acute problems.  She has been taking zithromax 1 daily maintenance, and she completed 20 days of manesium. After last burst, prednisone is back now to 10 mg daily.   08/17/11- 34  yoF former smoker with COPD/ chronic obstructive asthma, marked labile component with recurrent acute bronchitis complicated by  Paroxysmal atrial fib and hx Aortic Stenosis She says she had been doing very well. Her family shared a viral syndrome and she got it a week ago. Describes aching low-grade fever nasal congestion fatigue with some cough and wheeze. 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 Her physician gave steroid shot, doxycycline pending today. She had tapered maintenance prednisone 5 mg every other day but increased to 10 mg daily a few days ago. Has also had left cataract surgery.  09/28/11-  72  yoF former smoker with COPD/ chronic obstructive asthma, marked labile component with recurrent acute bronchitis complicated by  Paroxysmal atrial fib and hx Aortic Stenosis Hospital follow up visit. Was hospitalized at [facility] long December 18 with a viral pattern bronchitis, green sputum. Dr. Henrene Pastor had given Z-Pak and we sent Tamiflu. She then got Augmentin plus Levaquin plus prednisone for hospital discharge. "Still not over it" with residual cough of white sputum. She has tapered prednisone back to 10 mg daily. Has a cold sore now on her upper lip which she  is treating. Discharge summary was reviewed. CT scan of chest 09/08/2011 showed COPD and emphysema with bronchitis changes in the right upper lobe, calcified coronary artery plaques. Images reviewed with her.  11/24/11-  19  yoF former smoker with COPD/ chronic obstructive asthma, marked labile component with recurrent acute bronchitis  complicated by  Paroxysmal atrial fib and hx Aortic Stenosis Now on prednisone taper day 5, started at 60 mg daily. Somewhat better. This exacerbation started when she stood out in the cold. Next day she had  yellow postnasal drainage. Now coughing productive green and yellow. No blood no chest pain. Father smoked and died of COPD. We had an end of life discussion. She would favor resuscitation but not sustained life support.  12/22/11- 21  yoF former smoker with COPD/ chronic obstructive asthma, marked labile component with recurrent acute bronchitis complicated by  Paroxysmal atrial fib and hx Aortic Stenosis Hospital follow up visit-hospitalized 12/07/2011 through 12/14/2011 for exacerbation of COPD. On a prednisone taper now at 40 mg daily with maintenance cold 10 mg daily. Using oxygen for sleep. She is not clear about her discharge medications. She was treated for anxiety and dyspnea during the hospitalization, using Xanax and then morphine.  02/01/12- 67  yoF former smoker with COPD/ chronic obstructive asthma, marked labile component with recurrent acute bronchitis complicated by  Paroxysmal atrial fib and hx Aortic Stenosis Good and bad days-breathing is worse with activity. Today she feels well for her. 6 minute walk test 01/05/2012-93%, 86%, 95% on room air. 309 m. She desaturates significantly with exertion, limiting exercise tolerance because of her lung disease. She uses oxygen 3 L/[employer] with oxygen conserving portable. She continues prednisone 10 mg daily maintenance and finds that she cannot go lower.  04/13/12- 89  yoF former smoker with COPD/ chronic obstructive asthma, marked labile component with recurrent acute bronchitis complicated by  Paroxysmal atrial fib and hx Aortic Stenosis  Patient states had a staph infection x 1 month ago. c/o loss of voice, irritated throat, chest congestion, and cough.  Dr. Marina Goodell treated with sulfa drug. She doesn't think pharyngitis is quite gone and  chest is "not as good" with scant productive cough over the past week. Denies fever or purulent sputum. COPD assessment test (CAT) 15/40  07/12/12-79  yoF former smoker with COPD/ chronic obstructive asthma, marked labile component with recurrent acute bronchitis complicated by  Paroxysmal atrial fib and hx Aortic Stenosis  Pt states symptoms have not changed--off and on--Pt c/o increased reflux and pain in upper stomach; Rxd Omeprazole 20mg  1 bid, given some relief.  Omnicef in September helped sinusitis at that time. Has had flu vaccine. Back down to maintenance prednisone 10 mg daily for the past week after needing a burst and taper. COPD assessment test (CAT) score 15/40  08/11/12- 79  yoF former smoker with COPD/ chronic obstructive asthma, marked labile component with recurrent acute bronchitis complicated by  Paroxysmal atrial fib and hx Aortic Stenosis Acute visit: started getting sick Monday-woke up with sneezing(green in color) drainage, unable to breathe-had to use rescue inhaler prior to nebulizer tx's to feel able to catch breath. Wheezing has increased.  green mucus from nose, yellow mucus from throat. Sneezing. Has been on maintenance prednisone 5 mg. Thinks nebulizing with Perforomist was irritating and made her worse.  Review of Systems-see HPI Constitutional:   No weight loss, night sweats, Fevers, chills, + fatigue, lassitude. HEENT:   No headaches,  Difficulty swallowing,  Tooth/dental problems,  Sore throat,                +  sneezing, itching, ear ache, +nasal congestion, post nasal drip,  CV:  No chest pain,  Orthopnea, PND, swelling in lower extremities, anasarca, dizziness, palpitations GI  No heartburn, indigestion, abdominal pain, nausea,   Resp:-No excess mucus,  +  Cough  sputum,  No coughing up of blood.  No change in color of mucus.  + wheezing. Skin: no rash or lesions. GU: . MS:  + arthritis pains Psych:  No change in mood or affect. No depression or anxiety.   No memory loss.  Objective:   Physical Exam BP 146/82  Pulse 127  Ht 5' 3.5" (1.613 m)  Wt 63.141 kg (139 lb 3.2 oz)  BMI 24.27 kg/m2  SpO2 93% General- Alert, Oriented, Affect-appropriate, Distress-  none; +talkative.  Skin- cold sore on upper lip. Lymphadenopathy- none with special attention to cervical nodes Head- atraumatic            Eyes- Gross vision intact, PERRLA, conjunctivae clear secretions            Ears- Hearing, canals normal            Nose- Clear, No-Septal dev, mucus, polyps, erosion, perforation             Throat- Mallampati II , mucosa clear with no inflammation seen , drainage- none, tonsils- atrophic,  Neck- flexible , trachea midline, no stridor , thyroid nl, carotid no bruit Chest - symmetrical excursion , unlabored           Heart/CV- Feels like RR , no murmur heard , no gallop  , no rub, nl s1 s2                            JVD- -none, edema- none, stasis changes- none, varices- none           Lung-  +decreased breath sounds, no- wheeze, unlabored., cough- none , dullness-none, rub- none.            Chest wall-  Abd-  Br/ Gen/ Rectal- Not done, not indicated Extrem- cyanosis- none, clubbing, none, atrophy- none, strength- nl     +osteoarthritis changes in hands Neuro- grossly intact to observation            Patient ID: [name], female    DOB: Mar 23, 1933, 79 y.o.   MRN: [id number]  HPI 74 yoF former smoker with COPD/ chronic obstructive asthma, marked labile component with recurrent acute bronchitis complicated by  Paroxysmal atrial fib and hx Aortic Stenosis. After bad 2-3 months, she cleared completely with 30 days of azithromycin given February 6. Dr Eden Emms has seen her and felt her murmur is stable. In spite of pollen season she feels very well. Sleeps with O2 every night and uses it as needed in daytime.   04/21/11- 37  yoF former smoker with COPD/ chronic obstructive asthma, marked labile component with recurrent acute bronchitis  complicated by  Paroxysmal atrial fib and hx Aortic Stenosis. CXR pending today She called last night with exacerbation and comes in this morning. She called her PCP 6 weeks ago with increased wheeze. Was given azithromycin x 1 month, prednisone taper then 10 mg daily. Could tend garden in heat, but remained tight in chest . In last 2 days much tighter, "full" in chest. Denies fever, sore throat, green, chest pain or swelling.  Arthritis bothering her, stressed by husband who has had another CVA, caring for 7 pets, doing a lot of canning- feels "  so tired". Continues O2 2 L/M for sleep and prn.  04/30/11- 04/21/11- 78  yoF former smoker with COPD/ chronic obstructive asthma, marked labile component with recurrent acute bronchitis complicated by  Paroxysmal atrial fib and hx Aortic Stenosis Doesn't feel toxic or infected, but wheeze isn't breaking. Little phlegm. She tapered from 60 to 20 mg prednisone daily. We discussed her improvement during the winter with a prolonged course of azithromycin. We discussed the anti-inflammatory effect attributed to macrolides; also the tocolytic effect of magnesium.   05/18/11-  58  yoF former smoker with COPD/ chronic obstructive asthma, marked labile component with recurrent acute bronchitis complicated by  Paroxysmal atrial fib and hx Aortic Stenosis She reports feeling "some better" - able now to walk out to garden. No new pain or infection or acute problems.  She has been taking zithromax 1 daily maintenance, and she completed 20 days of manesium. After last burst, prednisone is back now to 10 mg daily.   08/17/11- 89  yoF former smoker with COPD/ chronic obstructive asthma, marked labile component with recurrent acute bronchitis complicated by  Paroxysmal atrial fib and hx Aortic Stenosis She says she had been doing very well. Her family shared a viral syndrome and she got it a week ago. Describes aching low-grade fever nasal congestion fatigue with some cough and  wheeze. 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 Nasal swab that her primary physician was negative for influenza , but she has not had the flu shot. Her physician gave steroid shot, doxycycline pending today. She had tapered maintenance prednisone 5 mg every other day but increased to 10 mg daily a few days ago. Has also had left cataract surgery.  09/28/11-  68  yoF former smoker with COPD/ chronic obstructive asthma, marked labile component with recurrent acute bronchitis complicated by  Paroxysmal atrial fib and hx Aortic Stenosis Hospital follow up visit. Was hospitalized at [facility] long December 18 with a viral pattern bronchitis, green sputum. Dr. Marina Goodell had given Z-Pak and we sent Tamiflu. She then got Augmentin plus Levaquin plus prednisone for hospital discharge. "Still not over it" with residual cough of white sputum. She has tapered prednisone back to 10 mg daily. Has a cold sore now on her upper lip which she is treating. Discharge summary was reviewed. CT scan of chest 09/08/2011 showed COPD and emphysema with bronchitis changes in the right upper lobe, calcified coronary artery plaques. Images reviewed with her.  11/24/11-  87  yoF former smoker with COPD/ chronic obstructive asthma, marked labile component with recurrent acute bronchitis complicated by  Paroxysmal atrial fib and hx Aortic Stenosis Now on prednisone taper day 5, started at 60 mg daily. Somewhat better. This exacerbation started when she stood out in the cold. Next day she had  yellow postnasal drainage. Now coughing productive green and yellow. No blood no chest pain. Father smoked and died of COPD. We had an end of life discussion. She would favor resuscitation but not sustained life support.  12/22/11- 10  yoF former smoker with COPD/ chronic obstructive asthma, marked labile component with recurrent acute bronchitis complicated by  Paroxysmal atrial fib and hx Aortic Stenosis Hospital follow up visit-hospitalized  12/07/2011 through 12/14/2011 for exacerbation of COPD. On a prednisone taper now at 40 mg daily with maintenance cold 10 mg daily. Using oxygen for sleep. She is not clear about her discharge medications. She was treated for anxiety and dyspnea during the hospitalization, using Xanax and then morphine.  02/01/12-  89  yoF former smoker with COPD/ chronic obstructive asthma, marked labile component with recurrent acute bronchitis complicated by  Paroxysmal atrial fib and hx Aortic Stenosis Good and bad days-breathing is worse with activity. Today she feels well for her. 6 minute walk test 01/05/2012-93%, 86%, 95% on room air. 309 m. She desaturates significantly with exertion, limiting exercise tolerance because of her lung disease. She uses oxygen 3 L/[employer] with oxygen conserving portable. She continues prednisone 10 mg daily maintenance and finds that she cannot go lower.  04/13/12- 67  yoF former smoker with COPD/ chronic obstructive asthma, marked labile component with recurrent acute bronchitis complicated by  Paroxysmal atrial fib and hx Aortic Stenosis  Patient states had a staph infection x 1 month ago. c/o loss of voice, irritated throat, chest congestion, and cough.  Dr. Marina Goodell treated with sulfa drug. She doesn't think pharyngitis is quite gone and chest is "not as good" with scant productive cough over the past week. Denies fever or purulent sputum. COPD assessment test (CAT) 15/40  07/12/12-79  yoF former smoker with COPD/ chronic obstructive asthma, marked labile component with recurrent acute bronchitis complicated by  Paroxysmal atrial fib and hx Aortic Stenosis  Pt states symptoms have not changed--off and on--Pt c/o increased reflux and pain in upper stomach; Rxd Omeprazole 20mg  1 bid, given some relief.  Omnicef in September helped sinusitis at that time. Has had flu vaccine. Back down to maintenance prednisone 10 mg daily for the past week after needing a burst and taper. COPD  assessment test (CAT) score 15/40  08/11/12- 79  yoF former smoker with COPD/ chronic obstructive asthma, marked labile component with recurrent acute bronchitis complicated by  Paroxysmal atrial fib and hx Aortic Stenosis Acute visit: started getting sick Monday-woke up with sneezing(green in color) drainage, unable to breathe-had to use rescue inhaler prior to nebulizer tx's to feel able to catch breath. Wheezing has increased.  green mucus from nose, yellow mucus from throat. Sneezing. Has been on maintenance prednisone 5 mg. Thinks nebulizing with Perforomist was irritating and made her worse.  10/12/12- 13  yoF former smoker with COPD/ chronic obstructive asthma, marked labile component with recurrent acute bronchitis complicated by  Paroxysmal atrial fib and hx Aortic Stenosis FOLLOWS FOR: pt reports breathing has been up and down--  pt reports she has been having a worsening sore throat and some wheezing --denies any other complaints Her primary physician, Dr. Marina Goodell, gave Augmentin which she has not started. Wheeze comes and goes. On prednisone 10 mg daily maintenance up to 10 mg extra yesterday.  Review of Systems-see HPI Constitutional:   No weight loss, night sweats, Fevers, chills, + fatigue, lassitude. HEENT:   No headaches,  Difficulty swallowing,  Tooth/dental problems,  +Sore throat,                + sneezing, itching, ear ache, +nasal congestion, post nasal drip,  CV:  No chest pain,  Orthopnea, PND, swelling in lower extremities, anasarca, dizziness, palpitations GI  No heartburn, indigestion, abdominal pain, nausea,   Resp:-No excess mucus,  +  Cough  sputum,  No coughing up of blood.  No change in color of mucus.  + wheezing. Skin: no rash or lesions. GU: . MS:  + arthritis pains Psych:  No change in mood or affect. No depression or anxiety.  No memory loss.  Objective:   Physical Exam BP 152/70  Pulse 95  Ht 5\' 3"  (1.6 m)  Wt 134 lb 6.4 oz (  60.963 kg)  BMI 23.81 kg/m2   SpO2 96% General- Alert, Oriented, Affect-appropriate, Distress-  none; not in distress today. Skin- cold sore on upper lip. Lymphadenopathy- none with special attention to cervical nodes Head- atraumatic            Eyes- Gross vision intact, PERRLA, conjunctivae clear secretions            Ears- Hearing, canals normal            Nose- Clear, No-Septal dev, mucus, polyps, erosion, perforation             Throat- Mallampati II , mucosa a little red , drainage- none, tonsils- atrophic, + mild hoarse Neck- flexible , trachea midline, no stridor , thyroid nl, carotid no bruit Chest - symmetrical excursion , unlabored           Heart/CV- Feels like RR , no murmur heard , no gallop  , no rub, nl s1 s2                            JVD- -none, edema- none, stasis changes- none, varices- none           Lung-  +decreased breath sounds, no- wheeze, unlabored., cough- none , dullness-none, rub- none.            Chest wall-  Abd-  Br/ Gen/ Rectal- Not done, not indicated Extrem- cyanosis- none, clubbing, none, atrophy- none, strength- nl     +osteoarthritis changes in hands Neuro- grossly intact to observation

## 2012-10-12 NOTE — Patient Instructions (Addendum)
We will call in a prescription for [employer]'s Magic Mouthwash    150 ml swish and swallow one tablespoon 4 x daily  Ok to take an extra prednisone occasionally if you feel you need it  Hold the augmentin antibiotic for now

## 2012-10-19 ENCOUNTER — Telehealth: Payer: Self-pay | Admitting: Internal Medicine

## 2012-10-19 NOTE — Telephone Encounter (Signed)
I spoke with pt. Aware of CDY recs. She stated she did not need RX called in for prednisone at this time. Will sign off message

## 2012-10-19 NOTE — Telephone Encounter (Signed)
Forwarding to CY. 

## 2012-10-19 NOTE — Telephone Encounter (Signed)
Suggest she take prednisone 60 mg x 2 days, 50 x 2 days, 4 x 2 days, 30 x 2 days, 20 x 2 days, then on down to her maintenance

## 2012-10-19 NOTE — Telephone Encounter (Signed)
Seen on 10-12-12 for Routine OV with CY-states that since then she is having increased wheezing (activity makes it worse). For the past 3 days patient has taken 3 10mg  prednisone tablets daily with no relief. KC as doc of the day please advise if you will give instructions for patient to take prednisone taper(she states this normally helps her). Also, patient has plenty of prednisone tablets at home to take a taper.   Below is patient instructions from 10-12-12 OV with CY: Patient Instructions     We will call in a prescription for [employer]'s Magic Mouthwash 150 ml swish and swallow one tablespoon 4 x daily  Ok to take an extra prednisone occasionally if you feel you need it  Hold the augmentin antibiotic for now

## 2012-10-23 NOTE — Assessment & Plan Note (Signed)
Okay to take an extra prednisone on some days if needed, still maintaining baseline 10 mg daily.  Hold the Augmentin for now. Try Magic mouthwash for throat irritation of recent pharyngitis

## 2012-11-07 ENCOUNTER — Telehealth: Payer: Self-pay | Admitting: Internal Medicine

## 2012-11-07 MED ORDER — PREDNISONE 10 MG PO TABS: ORAL_TABLET | ORAL | Status: DC

## 2012-11-07 NOTE — Telephone Encounter (Signed)
prednisone 10 mg, # 20, 4 X 2 DAYS, 3 X 2 DAYS, 2 X 2 DAYS, 1 X 2 DAYS  

## 2012-11-07 NOTE — Telephone Encounter (Signed)
Pt aware of RX and will call if her sxs do not improve.

## 2012-11-07 NOTE — Telephone Encounter (Signed)
Spoke with pt states she is still having some wheezing,sob. Chest tightness. Denies any cough at this time. Requesting another round of Prednisone 10mg . Pt would like to know something before late evening cause  She doesn't like to drive after dark.  Allergies  Allergen Reactions  . Amoxicillin-Pot Clavulanate     GI upset  . Daliresp (Roflumilast) Other (See Comments)    unknown  . Diltiazem Nausea Only  . Montelukast Sodium     REACTION: flu-like symptoms  . Zafirlukast Other (See Comments)    unknown   Dr Maple Hudson please advise.  Thank you

## 2012-11-14 ENCOUNTER — Telehealth: Payer: Self-pay | Admitting: Internal Medicine

## 2012-11-14 MED ORDER — PREDNISONE 10 MG PO TABS: 10.0000 mg | ORAL_TABLET | Freq: Every day | ORAL | Status: DC

## 2012-11-14 NOTE — Telephone Encounter (Signed)
PEr last ov note pt is taking prednisone 10mg  daily. Per Florentina Addison pt needs to contact PCP for refill on xanax. Pt aware. Carron Curie, CMA

## 2012-11-14 NOTE — Telephone Encounter (Signed)
Pt called back to add the following: She also needs a new rx for alprazolam called in to pharmacy as well. Hazel Sams

## 2012-11-28 ENCOUNTER — Other Ambulatory Visit: Payer: Self-pay | Admitting: Internal Medicine

## 2012-11-30 ENCOUNTER — Telehealth: Payer: Self-pay | Admitting: Internal Medicine

## 2012-11-30 NOTE — Telephone Encounter (Signed)
Per Dr Maple Hudson- increase prednisone to 60 mg today, and then call us tomorrow with an update She verbalized understanding and states that nothing further needed She has enough pred to take what CDY had rec Will call back with update

## 2012-11-30 NOTE — Telephone Encounter (Signed)
Called, spoke with pt.  C/o increased SOB "all the time," chest tightness, sneezing spells, wheezing, chest congestion, and coughing some but not a lot x 3-4 days.  Cough is nonprod.  Denies f/c/s.  Using proair with some relief and taking mucinex 1 tablet daily.  States increasing prednisone in the past has helped but not sure if this is what she needs.  Requesting recs.  As Dr. Maple Hudson is not in the office this afternoon, will route msg to another dr to advise.  MW, pls advise.  Thank you.  ** Pt states if she does need to increase prednisone, she will need rx called in.  Last OV with CDY 10/12/12; asked to f/u in 3 months Pending OV with CDY 01/11/13  [facility] [place]  Allergies verified with pt:  Allergies  Allergen Reactions  . Amoxicillin-Pot Clavulanate     GI upset  . Daliresp (Roflumilast) Other (See Comments)    unknown  . Diltiazem Nausea Only  . Montelukast Sodium     REACTION: flu-like symptoms  . Zafirlukast Other (See Comments)    unknown

## 2012-12-01 ENCOUNTER — Telehealth: Payer: Self-pay | Admitting: Internal Medicine

## 2012-12-01 MED ORDER — ALBUTEROL SULFATE HFA 108 (90 BASE) MCG/ACT IN AERS: 2.0000 | INHALATION_SPRAY | Freq: Four times a day (QID) | RESPIRATORY_TRACT | Status: DC | PRN

## 2012-12-01 NOTE — Telephone Encounter (Signed)
Unable to reach--no alternative number. Will try again later.

## 2012-12-01 NOTE — Telephone Encounter (Signed)
Per 3.12.14 phone note: [name], CMA at 11/30/2012 3:30 PM     Per Dr Maple Hudson- increase prednisone to 60 mg today, and then call us tomorrow with an update  She verbalized understanding and states that nothing further needed  She has enough pred to take what CDY had rec  Will call back with update    Called spoke with patient who reports that her breathing is "some better" since beginning the prednisone but still having the sneezing, dyspnea, tightness and wheezing.  Due to it being close to the weekend, pt is requesting to be seen.  Offered ov w/ TP tomorrow (not in office today) but patient declined stating that she only wants to see CY.  Pt stated that she is fine to wait until tomorrow for ov.  Dr Maple Hudson please advise, thanks!

## 2012-12-01 NOTE — Telephone Encounter (Signed)
Katie- we can use a nurse hold slot for her

## 2012-12-01 NOTE — Telephone Encounter (Signed)
Please let patient know she can come in on Friday 12-02-12 at 11:15am appt or 4:00pm appt with CY. Thanks.

## 2012-12-01 NOTE — Telephone Encounter (Signed)
Patient aware that her refill for ProAir has been sent to the pharmacy.  Patient did not need a refill of nebs. Proair Inhale 2 puffs into the lungs every 6 (six) hours as needed for wheezing or shortness of breath. For shortness of breath x prn fills [facility] [address]

## 2012-12-01 NOTE — Telephone Encounter (Signed)
Patient aware of appt time.  She will be here tomorrow at 11:00.  But patient is needing refill--proair and albuterol--[facility] Patient[facility] # [phone]

## 2012-12-02 ENCOUNTER — Encounter: Payer: Self-pay | Admitting: Internal Medicine

## 2012-12-02 ENCOUNTER — Ambulatory Visit (INDEPENDENT_AMBULATORY_CARE_PROVIDER_SITE_OTHER): Payer: Medicare Other | Admitting: Internal Medicine

## 2012-12-02 VITALS — BP 136/80 | HR 100 | Ht 63.0 in | Wt 139.4 lb

## 2012-12-02 DIAGNOSIS — J441 Chronic obstructive pulmonary disease with (acute) exacerbation: Secondary | ICD-10-CM

## 2012-12-02 MED ORDER — PREDNISONE 10 MG PO TABS: ORAL_TABLET | ORAL | Status: DC
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
## 2012-12-02 NOTE — Patient Instructions (Addendum)
Prednisone 60 mg daily. Taper gradually as tolerated back to 10 mg/ day  Script refilling prednisone sent to drug store  Keep April appointment - call earlier if needed

## 2012-12-06 NOTE — Assessment & Plan Note (Signed)
This is another flareup of her asthma with COPD pattern. Probably viral triggered. She is beginning to come under control on 60 mg of daily prednisone. I discussed with her how to gradually taper back to her maintenance 10 mg daily. We can wait on other interventions and she will call as needed.

## 2012-12-15 DIAGNOSIS — J449 Chronic obstructive pulmonary disease, unspecified: Secondary | ICD-10-CM | POA: Diagnosis not present

## 2012-12-15 DIAGNOSIS — G35 Multiple sclerosis: Secondary | ICD-10-CM | POA: Diagnosis not present

## 2012-12-15 DIAGNOSIS — I10 Essential (primary) hypertension: Secondary | ICD-10-CM | POA: Diagnosis not present

## 2012-12-15 DIAGNOSIS — E785 Hyperlipidemia, unspecified: Secondary | ICD-10-CM | POA: Diagnosis not present

## 2012-12-22 ENCOUNTER — Telehealth: Payer: Self-pay | Admitting: Internal Medicine

## 2012-12-22 MED ORDER — DOXYCYCLINE HYCLATE 100 MG PO TABS: ORAL_TABLET | ORAL | Status: DC

## 2012-12-22 NOTE — Telephone Encounter (Signed)
I spoke with pt and is aware of CDY recs. She voiced her understanding. She stated she already has the prednisone. Rx has been called in for doxy. Nothing further was needed

## 2012-12-22 NOTE — Telephone Encounter (Signed)
I spoke with pt. She c/o sneezing, runny nose, nasal congestion, blowing out yellow phlem, SOB, PND, a lot of wheezing x 3 days. Denies any cough/f/s/n/v. She is not taking anything OTC. Requesting further recs. Please advise Dr. Maple Hudson thanks Last ov 12/02/12 Pending 01/11/13 Allergies  Allergen Reactions  . Amoxicillin-Pot Clavulanate     GI upset  . Daliresp (Roflumilast) Other (See Comments)    unknown  . Diltiazem Nausea Only  . Montelukast Sodium     REACTION: flu-like symptoms  . Zafirlukast Other (See Comments)    unknown

## 2012-12-22 NOTE — Telephone Encounter (Signed)
Per CY-prednisone to 60 mg QD until feels able to taper; also call in Doxycycline 100 mg #8 take 2 today then 1 daily until gone no refills.

## 2012-12-30 ENCOUNTER — Telehealth: Payer: Self-pay | Admitting: Internal Medicine

## 2012-12-30 NOTE — Telephone Encounter (Signed)
Called and spoke with pt and she stated that she was given doxy and she has finished this and was told to do a larger dose of the prednisone and she stated that she did this but for only a day or so since she started feeling better.   She stated that she is wheezing now and this started to get bad yesterday.  She stated that she has been outside some and knows this has not helped her at all.  Pt wanted to see if she should increase the dose of her prednisone again to see if this will help.  CY please advise. Thanks   Last ov--12/02/2012 Next ov  01/11/2013  Allergies  Allergen Reactions  . Amoxicillin-Pot Clavulanate     GI upset  . Daliresp (Roflumilast) Other (See Comments)    unknown  . Diltiazem Nausea Only  . Montelukast Sodium     REACTION: flu-like symptoms  . Zafirlukast Other (See Comments)    unknown

## 2012-12-30 NOTE — Telephone Encounter (Signed)
Per CY-yes as before.

## 2012-12-30 NOTE — Telephone Encounter (Signed)
Pt aware. Nothing further was needed 

## 2013-01-04 DIAGNOSIS — J449 Chronic obstructive pulmonary disease, unspecified: Secondary | ICD-10-CM | POA: Diagnosis not present

## 2013-01-04 DIAGNOSIS — E785 Hyperlipidemia, unspecified: Secondary | ICD-10-CM | POA: Diagnosis not present

## 2013-01-05 ENCOUNTER — Telehealth: Payer: Self-pay | Admitting: Internal Medicine

## 2013-01-05 NOTE — Telephone Encounter (Signed)
Spoke with patient she states she saw her "local" MD to have routine lab work done x3 weeks ago and when she received results her WBC was elevated.  Patient states she was told to come back yesterday to recheck blood work, received results today and WBC was higher than previous results and also was informed that she has immature RBC and now they are going to refer her to Hematologist. Patient would like to know if these elevations could be a results of any of her "lung medications"  Per CY: prednisone/steroids can elevate WBC, as for immature RBC does not think any of her medications would do this and agrees with referral to hematology  Spoke with patient, made her aware of results. Verbalized understanding and nothing further needed at this time.

## 2013-01-11 ENCOUNTER — Ambulatory Visit (INDEPENDENT_AMBULATORY_CARE_PROVIDER_SITE_OTHER): Payer: Medicare Other | Admitting: Internal Medicine

## 2013-01-11 ENCOUNTER — Telehealth: Payer: Self-pay | Admitting: Internal Medicine

## 2013-01-11 ENCOUNTER — Encounter: Payer: Self-pay | Admitting: Internal Medicine

## 2013-01-11 VITALS — BP 122/82 | HR 110 | Ht 62.25 in | Wt 137.6 lb

## 2013-01-11 DIAGNOSIS — J4489 Other specified chronic obstructive pulmonary disease: Secondary | ICD-10-CM

## 2013-01-11 DIAGNOSIS — J309 Allergic rhinitis, unspecified: Secondary | ICD-10-CM | POA: Diagnosis not present

## 2013-01-11 DIAGNOSIS — J449 Chronic obstructive pulmonary disease, unspecified: Secondary | ICD-10-CM

## 2013-01-11 MED ORDER — PREDNISONE 10 MG PO TABS: 10.0000 mg | ORAL_TABLET | Freq: Every day | ORAL | Status: DC

## 2013-01-11 MED ORDER — BUDESONIDE 0.25 MG/2ML IN SUSP: RESPIRATORY_TRACT | Status: DC

## 2013-01-11 NOTE — Telephone Encounter (Signed)
Spoke with patient-states that she needs RX's printed and sent to [facility] for Performist, Pulmicort, and Atrovent. Pt aware that I will have CY sign Rx's in the morning and will send to [facility] via [facility]'s.

## 2013-01-11 NOTE — Patient Instructions (Addendum)
Sample Breo Ellipta   Try 1 puff, then rinse mouth, just one time every day  Use up the sample then quit  Consider dust/ pollen mask when outside this time of year  Refill script for budesonide neb solution to do twice daily  Refill script for maintenance prednisone 10 mg daily  Please call as needed
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
## 2013-01-12 MED ORDER — IPRATROPIUM BROMIDE 0.02 % IN SOLN: 0.5000 mg | Freq: Three times a day (TID) | RESPIRATORY_TRACT | Status: DC

## 2013-01-12 MED ORDER — BUDESONIDE 0.25 MG/2ML IN SUSP: RESPIRATORY_TRACT | Status: DC

## 2013-01-12 MED ORDER — FORMOTEROL FUMARATE 20 MCG/2ML IN NEBU: 20.0000 ug | INHALATION_SOLUTION | Freq: Two times a day (BID) | RESPIRATORY_TRACT | Status: DC

## 2013-01-12 NOTE — Telephone Encounter (Signed)
[name], has this been taken care of? Can msg be closed? Thank

## 2013-01-12 NOTE — Telephone Encounter (Signed)
Left message with husband-will relay to patient that Rx's have been given to [facility] to give to [facility]. Nothing more needed at this time. Will sign off on message.

## 2013-01-18 NOTE — Assessment & Plan Note (Signed)
Minor nasal symptoms. Chest gets much more attention.

## 2013-01-18 NOTE — Assessment & Plan Note (Signed)
Getting back under control after recent exacerbation requiring steroids. Plan-sample Breo Ellipta. Refill prednisone tablets and budesonide. Try to hold prednisone at 10 mg daily for now until stable.

## 2013-01-23 ENCOUNTER — Other Ambulatory Visit: Payer: Self-pay | Admitting: Internal Medicine

## 2013-01-24 DIAGNOSIS — I10 Essential (primary) hypertension: Secondary | ICD-10-CM | POA: Diagnosis not present

## 2013-01-24 DIAGNOSIS — K219 Gastro-esophageal reflux disease without esophagitis: Secondary | ICD-10-CM | POA: Diagnosis not present

## 2013-01-24 DIAGNOSIS — E7800 Pure hypercholesterolemia, unspecified: Secondary | ICD-10-CM | POA: Diagnosis not present

## 2013-01-24 DIAGNOSIS — Z79899 Other long term (current) drug therapy: Secondary | ICD-10-CM | POA: Diagnosis not present

## 2013-01-24 DIAGNOSIS — IMO0002 Reserved for concepts with insufficient information to code with codable children: Secondary | ICD-10-CM | POA: Diagnosis not present

## 2013-01-24 DIAGNOSIS — D72829 Elevated white blood cell count, unspecified: Secondary | ICD-10-CM | POA: Diagnosis not present

## 2013-01-24 DIAGNOSIS — J449 Chronic obstructive pulmonary disease, unspecified: Secondary | ICD-10-CM | POA: Diagnosis not present

## 2013-01-25 ENCOUNTER — Telehealth: Payer: Self-pay | Admitting: Internal Medicine

## 2013-01-25 MED ORDER — LEVALBUTEROL HCL 0.63 MG/3ML IN NEBU: 1.0000 | INHALATION_SOLUTION | Freq: Four times a day (QID) | RESPIRATORY_TRACT | Status: DC | PRN

## 2013-01-25 NOTE — Telephone Encounter (Signed)
I spoke with pt. i advised her will send RX. Nothing further was needed

## 2013-02-09 ENCOUNTER — Telehealth: Payer: Self-pay | Admitting: Internal Medicine

## 2013-02-09 NOTE — Telephone Encounter (Signed)
Called and spoke with the pt and she stated that she only has the sample that was given.  She has used up the sample that she had.  She is not sure if she will be able to afford the rx.  Wanted to know if CY wants her to stay on this medication all the time or just as needed.  She also wanted to know about the prednisone--should she do the prednisone or the breo PRN.  Thanks  Allergies  Allergen Reactions  . Amoxicillin-Pot Clavulanate     GI upset  . Daliresp (Roflumilast) Other (See Comments)    unknown  . Diltiazem Nausea Only  . Montelukast Sodium     REACTION: flu-like symptoms  . Zafirlukast Other (See Comments)    unknown

## 2013-02-09 NOTE — Telephone Encounter (Signed)
Per CY-take prednisone 30 mg through long weekend then taper as able D/C Breo.

## 2013-02-09 NOTE — Telephone Encounter (Signed)
Returning call says her insurance doesn't cover breo can be reached at [phone].[name]

## 2013-02-09 NOTE — Telephone Encounter (Signed)
Pt aware of recs. Nothing further was needed. She did not need RX sent.

## 2013-02-09 NOTE — Telephone Encounter (Signed)
I spoke with pt and she c/o lots of wheezing x 3-4 days.she stated last week when she started the breo it helped with the wheezing and it was gone. She took 4 tabs of pred 10 mg yesterday and today she took 3 tabs 10 mg. Pt is requesting further recs. Please advise Dr. Maple Hudson thanks Last OV 01/11/13 Pending 04/12/13 Allergies  Allergen Reactions  . Amoxicillin-Pot Clavulanate     GI upset  . Daliresp (Roflumilast) Other (See Comments)    unknown  . Diltiazem Nausea Only  . Montelukast Sodium     REACTION: flu-like symptoms  . Zafirlukast Other (See Comments)    unknown

## 2013-02-09 NOTE — Telephone Encounter (Signed)
Per CY-Did she use up and run out of Breo? Would she like a script and sample?

## 2013-03-06 ENCOUNTER — Telehealth: Payer: Self-pay | Admitting: Internal Medicine

## 2013-03-06 ENCOUNTER — Other Ambulatory Visit: Payer: Self-pay | Admitting: Internal Medicine

## 2013-03-06 NOTE — Telephone Encounter (Signed)
We sent RX back in may x 12 refills. I called and made pt aware. She stated they are telling her they have nothing further her  I called [facility] and they had pt rx placed on file for her. They will get this ready for her  I called and made pt aware. She voiced her understanding and needed nothing further

## 2013-03-15 DIAGNOSIS — S81809A Unspecified open wound, unspecified lower leg, initial encounter: Secondary | ICD-10-CM | POA: Diagnosis not present

## 2013-03-27 DIAGNOSIS — S81809A Unspecified open wound, unspecified lower leg, initial encounter: Secondary | ICD-10-CM | POA: Diagnosis not present

## 2013-03-28 ENCOUNTER — Telehealth: Payer: Self-pay | Admitting: Internal Medicine

## 2013-03-28 NOTE — Telephone Encounter (Signed)
Spoke with patient-she states that her nebulizer meds were not able to go through DME but DME did fax them to a local pharmacy for the patient. She will pick up at local pharmacy. Nothing more needed at this time.

## 2013-03-29 DIAGNOSIS — H2510 Age-related nuclear cataract, unspecified eye: Secondary | ICD-10-CM | POA: Diagnosis not present

## 2013-03-29 DIAGNOSIS — Z961 Presence of intraocular lens: Secondary | ICD-10-CM | POA: Diagnosis not present

## 2013-03-29 DIAGNOSIS — H40029 Open angle with borderline findings, high risk, unspecified eye: Secondary | ICD-10-CM | POA: Diagnosis not present

## 2013-03-29 DIAGNOSIS — H4720 Unspecified optic atrophy: Secondary | ICD-10-CM | POA: Diagnosis not present

## 2013-04-12 ENCOUNTER — Ambulatory Visit (INDEPENDENT_AMBULATORY_CARE_PROVIDER_SITE_OTHER): Payer: Medicare Other | Admitting: Internal Medicine

## 2013-04-12 ENCOUNTER — Encounter: Payer: Self-pay | Admitting: Internal Medicine

## 2013-04-12 VITALS — BP 122/76 | HR 109 | Ht 62.25 in | Wt 140.0 lb

## 2013-04-12 DIAGNOSIS — I359 Nonrheumatic aortic valve disorder, unspecified: Secondary | ICD-10-CM | POA: Diagnosis not present

## 2013-04-12 DIAGNOSIS — J449 Chronic obstructive pulmonary disease, unspecified: Secondary | ICD-10-CM | POA: Diagnosis not present

## 2013-04-12 DIAGNOSIS — J4489 Other specified chronic obstructive pulmonary disease: Secondary | ICD-10-CM

## 2013-04-12 MED ORDER — PREDNISONE 10 MG PO TABS: 10.0000 mg | ORAL_TABLET | Freq: Every day | ORAL | Status: DC

## 2013-04-12 NOTE — Patient Instructions (Addendum)
Try the nebulizer medicines you have, and let's see which help and which don't.  Levalbuterol/ Xopenex - This is  Not very stimulating, but expensive and may not be worth it.  Perforomist- long acting (8-12 hour) bronchodilator to use if needed. It might cause some stimulation.  Pulmicort/ budesonide- this is a passive, nonstimulating steroid meant to use as maintenance, twice daily  Atrovent/ ipratropium- this is a non-stimulating bronchodilator that can be used 4 times daily if needed.   Script sent for prednisone to hold  Please call as needed
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
## 2013-04-26 DIAGNOSIS — J449 Chronic obstructive pulmonary disease, unspecified: Secondary | ICD-10-CM | POA: Diagnosis not present

## 2013-04-26 DIAGNOSIS — I10 Essential (primary) hypertension: Secondary | ICD-10-CM | POA: Diagnosis not present

## 2013-04-26 DIAGNOSIS — E785 Hyperlipidemia, unspecified: Secondary | ICD-10-CM | POA: Diagnosis not present

## 2013-04-26 DIAGNOSIS — G35 Multiple sclerosis: Secondary | ICD-10-CM | POA: Diagnosis not present

## 2013-04-27 NOTE — Assessment & Plan Note (Signed)
She is in good condition today, noting oxygen saturation 96% on room air at rest with no wheezing at all. mostly COPD with a small reversible component Plan-we discussed her medications

## 2013-04-27 NOTE — Assessment & Plan Note (Signed)
I do not hear a murmur on exam at this time

## 2013-05-11 DIAGNOSIS — I739 Peripheral vascular disease, unspecified: Secondary | ICD-10-CM | POA: Diagnosis not present

## 2013-05-12 ENCOUNTER — Ambulatory Visit (INDEPENDENT_AMBULATORY_CARE_PROVIDER_SITE_OTHER): Payer: Medicare Other | Admitting: Cardiovascular Disease

## 2013-05-12 VITALS — BP 148/92 | HR 114 | Ht 62.5 in | Wt 140.8 lb

## 2013-05-12 DIAGNOSIS — I4891 Unspecified atrial fibrillation: Secondary | ICD-10-CM | POA: Diagnosis not present

## 2013-05-12 DIAGNOSIS — R609 Edema, unspecified: Secondary | ICD-10-CM

## 2013-05-12 DIAGNOSIS — J449 Chronic obstructive pulmonary disease, unspecified: Secondary | ICD-10-CM

## 2013-05-12 DIAGNOSIS — I10 Essential (primary) hypertension: Secondary | ICD-10-CM

## 2013-05-12 DIAGNOSIS — I359 Nonrheumatic aortic valve disorder, unspecified: Secondary | ICD-10-CM

## 2013-05-12 DIAGNOSIS — I480 Paroxysmal atrial fibrillation: Secondary | ICD-10-CM

## 2013-05-12 DIAGNOSIS — I350 Nonrheumatic aortic (valve) stenosis: Secondary | ICD-10-CM

## 2013-05-12 DIAGNOSIS — I8310 Varicose veins of unspecified lower extremity with inflammation: Secondary | ICD-10-CM

## 2013-05-12 NOTE — Assessment & Plan Note (Signed)
F/U DR Maple Hudson suggested she use claritin and Netti Pot saline washes

## 2013-05-12 NOTE — Assessment & Plan Note (Signed)
Skin sensation from stasis and venous infuf ? Component of neuropathy  Will check ABI;s and venous duplex but this is clearly not a "circulation" problem with plus 4 PTls bilaterally

## 2013-05-12 NOTE — Patient Instructions (Signed)
Your physician wants you to follow-up in:    6 MONTHS WITH DR Haywood Filler will receive a reminder letter in the mail two months in advance. If you don't receive a letter, please call our office to schedule the follow-up appointment. Your physician recommends that you continue on your current medications as directed. Please refer to the Current Medication list given to you today.  Your physician has requested that you have an echocardiogram. Echocardiography is a painless test that uses sound waves to create images of your heart. It provides your doctor with information about the size and shape of your heart and how well your heart's chambers and valves are working. This procedure takes approximately one hour. There are no restrictions for this procedure. Your physician has requested that you have a lower or upper extremity venous duplex. This test is an ultrasound of the veins in the legs or arms. It looks at venous blood flow that carries blood from the heart to the legs or arms. Allow one hour for a Lower Venous exam. Allow thirty minutes for an Upper Venous exam. There are no restrictions or special instructions.  LOWER  WITH  ABI'S

## 2013-05-12 NOTE — Assessment & Plan Note (Signed)
Not severe on exam F/U echo She would be a TAVR candidate

## 2013-05-12 NOTE — Progress Notes (Signed)
Patient ID: [name], female   DOB: 1932-11-12, 79 y.o.   MRN: [id number] 79 yo with sig. COPD sees Dr Maple Hudson.  History of mild to moderate AS and PAF.  Having issues with asthma and allergies.  Taking benedryl.  Has feeling that skin is too tight on her feet Primary concerned about circulation.  Had laceration in RLE that he sewed up that got infected Still erythematous.  No claudication on exam. Dyspnea chronic from lung disease.  No chest pain or syncope.    ROS: Denies fever, malais, weight loss, blurry vision, decreased visual acuity, cough, sputum, SOB, hemoptysis, pleuritic pain, palpitaitons, heartburn, abdominal pain, melena, lower extremity edema, claudication, or rash.  All other systems reviewed and negative  General: Affect appropriate Healthy:  appears stated age HEENT: normal Neck supple with no adenopathy JVP normal no bruits no thyromegaly Lungs clear with no wheezing and good diaphragmatic motion Heart:  S1/S2 Mild AS murmur, no rub, gallop or click PMI normal Abdomen: benighn, BS positve, no tenderness, no AAA no bruit.  No HSM or HJR Distal pulses intact with no bruits  Her PT;s are plus 4 bilterally No edema Neuro non-focal Skin warm and dry venous insuf LEls with erythema over RLE laceration site No muscular weakness   Current Outpatient Prescriptions  Medication Sig Dispense Refill  . albuterol (PROAIR HFA) 108 (90 BASE) MCG/ACT inhaler Inhale 2 puffs into the lungs every 6 (six) hours as needed for wheezing or shortness of breath. For shortness of breath  1 Inhaler  prn  . budesonide (PULMICORT) 0.25 MG/2ML nebulizer solution 1 neb twice daily  360 mL  3  . Dextromethorphan-Guaifenesin 60-1200 MG per 12 hr tablet Take 1 tablet by mouth every 12 (twelve) hours.        . formoterol (PERFOROMIST) 20 MCG/2ML nebulizer solution Take 2 mLs (20 mcg total) by nebulization 2 (two) times daily.  120 mL  prn  . hydrochlorothiazide 25 MG tablet Take 25 mg by mouth daily.         Marland Kitchen ipratropium (ATROVENT) 0.02 % nebulizer solution Take 2.5 mLs (0.5 mg total) by nebulization 3 (three) times daily.  75 mL  6  . levalbuterol (XOPENEX) 0.63 MG/3ML nebulizer solution USE 1 VIAL EVERY 6 HOURS AS NEEDED FOR WHEEZING OR SHORTNESS OF BREATH  360 mL  9  . loratadine (CLARITIN) 10 MG tablet Take 10 mg by mouth daily.      Marland Kitchen omeprazole (PRILOSEC) 20 MG capsule Take 1 tablet by mouth Twice daily.      . simvastatin (ZOCOR) 10 MG tablet Take 5 mg by mouth every morning.       . [DISCONTINUED] metoprolol tartrate (LOPRESSOR) 25 MG tablet Take 1 tablet (25 mg total) by mouth 2 (two) times daily.  30 tablet  0  . [DISCONTINUED] pantoprazole (PROTONIX) 40 MG tablet Take 1 tablet (40 mg total) by mouth daily at 12 noon.  10 tablet  0   No current facility-administered medications for this visit.    Allergies  Amoxicillin-pot clavulanate; Daliresp; Diltiazem; Montelukast sodium; and Zafirlukast  Electrocardiogram:  SR rate 114 PVC LAD poor r wave progression  Assessment and Plan

## 2013-05-12 NOTE — Assessment & Plan Note (Signed)
Maint NSR but may need further Rx in future given her high adrenergic tone

## 2013-05-15 ENCOUNTER — Encounter (INDEPENDENT_AMBULATORY_CARE_PROVIDER_SITE_OTHER): Payer: Medicare Other

## 2013-05-15 DIAGNOSIS — M7989 Other specified soft tissue disorders: Secondary | ICD-10-CM | POA: Diagnosis not present

## 2013-05-15 DIAGNOSIS — R609 Edema, unspecified: Secondary | ICD-10-CM

## 2013-05-16 ENCOUNTER — Ambulatory Visit (HOSPITAL_COMMUNITY): Payer: Medicare Other | Attending: Cardiovascular Disease | Admitting: Radiology

## 2013-05-16 ENCOUNTER — Telehealth: Payer: Self-pay | Admitting: Internal Medicine

## 2013-05-16 ENCOUNTER — Other Ambulatory Visit (HOSPITAL_COMMUNITY): Payer: Medicare Other

## 2013-05-16 DIAGNOSIS — R0609 Other forms of dyspnea: Secondary | ICD-10-CM | POA: Diagnosis not present

## 2013-05-16 DIAGNOSIS — I079 Rheumatic tricuspid valve disease, unspecified: Secondary | ICD-10-CM | POA: Insufficient documentation

## 2013-05-16 DIAGNOSIS — I4891 Unspecified atrial fibrillation: Secondary | ICD-10-CM | POA: Insufficient documentation

## 2013-05-16 DIAGNOSIS — Z87891 Personal history of nicotine dependence: Secondary | ICD-10-CM | POA: Diagnosis not present

## 2013-05-16 DIAGNOSIS — J449 Chronic obstructive pulmonary disease, unspecified: Secondary | ICD-10-CM | POA: Insufficient documentation

## 2013-05-16 DIAGNOSIS — F411 Generalized anxiety disorder: Secondary | ICD-10-CM | POA: Diagnosis not present

## 2013-05-16 DIAGNOSIS — I359 Nonrheumatic aortic valve disorder, unspecified: Secondary | ICD-10-CM | POA: Insufficient documentation

## 2013-05-16 DIAGNOSIS — I10 Essential (primary) hypertension: Secondary | ICD-10-CM | POA: Insufficient documentation

## 2013-05-16 DIAGNOSIS — I059 Rheumatic mitral valve disease, unspecified: Secondary | ICD-10-CM | POA: Diagnosis not present

## 2013-05-16 DIAGNOSIS — E785 Hyperlipidemia, unspecified: Secondary | ICD-10-CM | POA: Diagnosis not present

## 2013-05-16 DIAGNOSIS — I379 Nonrheumatic pulmonary valve disorder, unspecified: Secondary | ICD-10-CM | POA: Diagnosis not present

## 2013-05-16 DIAGNOSIS — R0989 Other specified symptoms and signs involving the circulatory and respiratory systems: Secondary | ICD-10-CM | POA: Insufficient documentation

## 2013-05-16 DIAGNOSIS — I350 Nonrheumatic aortic (valve) stenosis: Secondary | ICD-10-CM

## 2013-05-16 DIAGNOSIS — J4489 Other specified chronic obstructive pulmonary disease: Secondary | ICD-10-CM | POA: Insufficient documentation

## 2013-05-16 MED ORDER — DOXYCYCLINE HYCLATE 100 MG PO CAPS: ORAL_CAPSULE | ORAL | Status: DC

## 2013-05-16 NOTE — Telephone Encounter (Signed)
Per [facility]- offer doxy 100 mg # 8 2 today, then one daily  Increase prednisone to 60 mg daily until better, then taper down  Spoke with the pt and notified of recs and she verbalized understanding  Has enough pred Doxy was sent

## 2013-05-16 NOTE — Telephone Encounter (Signed)
Called and spoke with pt She states having increased SOB, wheezing, non prod cough, and yellow nasal d/c x 3 days She states that at onset of symptoms she increased prednisone from 10 to 30 mg and this has not made any difference  No appts open to [facility] this wk Please advise, thanks! Last ov 04/12/13 Next ov 07/14/13 Allergies  Allergen Reactions  . Amoxicillin-Pot Clavulanate     GI upset  . Daliresp [Roflumilast] Other (See Comments)    unknown  . Diltiazem Nausea Only  . Montelukast Sodium     REACTION: flu-like symptoms  . Zafirlukast Other (See Comments)    unknown

## 2013-05-16 NOTE — Progress Notes (Signed)
Echocardiogram performed.  

## 2013-06-06 ENCOUNTER — Telehealth: Payer: Self-pay | Admitting: Internal Medicine

## 2013-06-06 NOTE — Telephone Encounter (Signed)
I spoke with the pt and she states over the last 3-4 days she has been having increased wheezing, chest tightness, and increased SOB as well. The pt states she is set to see cardiology on 06-16-13 for a discussion about a possible heart cath. Pt wanted Dr. Maple Hudson to be aware of this as well in case he felt it was causing her current symptoms . Please advise. Carron Curie, CMA Allergies  Allergen Reactions  . Amoxicillin-Pot Clavulanate     GI upset  . Daliresp [Roflumilast] Other (See Comments)    unknown  . Diltiazem Nausea Only  . Montelukast Sodium     REACTION: flu-like symptoms  . Zafirlukast Other (See Comments)    unknown

## 2013-06-06 NOTE — Telephone Encounter (Signed)
Spoke with patient-aware to ramp up prednisone if getting worse.

## 2013-06-06 NOTE — Telephone Encounter (Signed)
Noted. Be quick to ramp up prednisone if getting worse.

## 2013-06-16 ENCOUNTER — Encounter: Payer: Self-pay | Admitting: Cardiovascular Disease

## 2013-06-16 ENCOUNTER — Ambulatory Visit (INDEPENDENT_AMBULATORY_CARE_PROVIDER_SITE_OTHER): Payer: Medicare Other | Admitting: Cardiovascular Disease

## 2013-06-16 VITALS — BP 141/66 | HR 86 | Resp 18 | Ht 62.0 in | Wt 140.0 lb

## 2013-06-16 DIAGNOSIS — I350 Nonrheumatic aortic (valve) stenosis: Secondary | ICD-10-CM

## 2013-06-16 DIAGNOSIS — Z79899 Other long term (current) drug therapy: Secondary | ICD-10-CM | POA: Diagnosis not present

## 2013-06-16 DIAGNOSIS — I359 Nonrheumatic aortic valve disorder, unspecified: Secondary | ICD-10-CM | POA: Diagnosis not present

## 2013-06-16 DIAGNOSIS — I509 Heart failure, unspecified: Secondary | ICD-10-CM

## 2013-06-16 DIAGNOSIS — I10 Essential (primary) hypertension: Secondary | ICD-10-CM

## 2013-06-16 DIAGNOSIS — R0600 Dyspnea, unspecified: Secondary | ICD-10-CM

## 2013-06-16 DIAGNOSIS — R0609 Other forms of dyspnea: Secondary | ICD-10-CM

## 2013-06-16 LAB — CBC WITH DIFFERENTIAL/PLATELET
Basophils Absolute: 0 10*3/uL (ref 0.0–0.1)
HCT: 43.9 % (ref 36.0–46.0)
Lymphs Abs: 1.1 10*3/uL (ref 0.7–4.0)
Monocytes Relative: 5.1 % (ref 3.0–12.0)
Platelets: 316 10*3/uL (ref 150.0–400.0)
RDW: 14.9 % — ABNORMAL HIGH (ref 11.5–14.6)

## 2013-06-16 LAB — BASIC METABOLIC PANEL
CO2: 28 mEq/L (ref 19–32)
Calcium: 8.6 mg/dL (ref 8.4–10.5)
GFR: 68.72 mL/min (ref >60.00)
Sodium: 136 mEq/L (ref 135–145)

## 2013-06-16 LAB — PROTIME-INR: INR: 1 ratio (ref 0.8–1.0)

## 2013-06-16 MED ORDER — LOSARTAN POTASSIUM 25 MG PO TABS: 25.0000 mg | ORAL_TABLET | Freq: Every day | ORAL | Status: DC

## 2013-06-16 NOTE — Patient Instructions (Addendum)
Your physician recommends that you schedule a follow-up appointment in: NEXT AVAILABLE WITH DR [facility] Your physician recommends that you continue on your current medications as directed. Please refer to the Current Medication list given to you today. START LOSARTAN  25 MG  1 EVERY DAY Your physician recommends that you return for lab work in: TODAY   BMET  BNP  CBC WITH DIFF  PT

## 2013-06-16 NOTE — Assessment & Plan Note (Signed)
Echo with EF 25-30%  Start ARB  Check labs and consider changing to stronger diuretic and adding low dose beta blocker F/U 4 weeks  Home oxygen  She will consider right and left cath A lot of her dyspnea is clearly still from her lung disease

## 2013-06-16 NOTE — Assessment & Plan Note (Signed)
Well controlled.  Continue current medications and low sodium Dash type diet.    

## 2013-06-16 NOTE — Assessment & Plan Note (Signed)
Not an operative candidate.  Symptoms more likely related to lung disease and low EF

## 2013-06-16 NOTE — Progress Notes (Signed)
Patient ID: [name], female   DOB: [birth year], 79 y.o.   MRN: [id number] 79 yo with sig. COPD sees Dr Maple Hudson. History of mild to moderate AS and PAF. Having issues with asthma and allergies. Taking benedryl. Has feeling that skin is too tight on her feet Primary concerned about circulation. Had laceration in RLE that he sewed up that got infected Still erythematous. No claudication on exam. Dyspnea chronic from lung disease. No chest pain or syncope.  Echo 8/14 Study Conclusions  - Left ventricle: The cavity size was moderately dilated. Wall thickness was normal. Systolic function was severely reduced. The estimated ejection fraction was in the range of 25% to 30%. Diffuse hypokinesis. - Aortic valve: Moderately calcified with likely moderate AS given degree of LV dysfunction - Mitral valve: Mild regurgitation. - Left atrium: The atrium was mildly dilated. - Atrial septum: No defect or patent foramen ovale was identified.   Venous duplex 8/14 no DVT    Discussed results of echo She is not sure she wants cath.  She is on HCTZ  May benefit from beta blocker but with advanced lung disease may worsen symptoms Told her we would check lab work including BNP and discuss again in 4 weeks  She is under some stress caring for her husband of 59 years with dementia.  Burnt  Right wrist while cooking    ROS: Denies fever, malais, weight loss, blurry vision, decreased visual acuity, cough, sputum, SOB, hemoptysis, pleuritic pain, palpitaitons, heartburn, abdominal pain, melena, lower extremity edema, claudication, or rash.  All other systems reviewed and negative  General: Affect appropriate Chronically ill white female HEENT: normal Neck supple with no adenopathy JVP normal no bruits no thyromegaly Lungs poor air movement no active wheezing and good diaphragmatic motion Heart:  S1/S2 no murmur, no rub, gallop or click PMI normal Abdomen: benighn, BS positve, no tenderness, no AAA no bruit.   No HSM or HJR Distal pulses intact with no bruits No edema Neuro non-focal Skin burn on right wrist  No muscular weakness   Current Outpatient Prescriptions  Medication Sig Dispense Refill  . albuterol (PROAIR HFA) 108 (90 BASE) MCG/ACT inhaler Inhale 2 puffs into the lungs every 6 (six) hours as needed for wheezing or shortness of breath. For shortness of breath  1 Inhaler  prn  . budesonide (PULMICORT) 0.25 MG/2ML nebulizer solution 1 neb twice daily  360 mL  3  . Dextromethorphan-Guaifenesin 60-1200 MG per 12 hr tablet Take 1 tablet by mouth every 12 (twelve) hours.        Marland Kitchen doxycycline (VIBRAMYCIN) 100 MG capsule 2 today, then 1 daily until gone  8 capsule  0  . formoterol (PERFOROMIST) 20 MCG/2ML nebulizer solution Take 2 mLs (20 mcg total) by nebulization 2 (two) times daily.  120 mL  prn  . hydrochlorothiazide 25 MG tablet Take 25 mg by mouth daily.        Marland Kitchen ipratropium (ATROVENT) 0.02 % nebulizer solution Take 2.5 mLs (0.5 mg total) by nebulization 3 (three) times daily.  75 mL  6  . levalbuterol (XOPENEX) 0.63 MG/3ML nebulizer solution USE 1 VIAL EVERY 6 HOURS AS NEEDED FOR WHEEZING OR SHORTNESS OF BREATH  360 mL  9  . loratadine (CLARITIN) 10 MG tablet Take 10 mg by mouth daily.      Marland Kitchen omeprazole (PRILOSEC) 20 MG capsule Take 1 tablet by mouth Twice daily.      . predniSONE (DELTASONE) 10 MG tablet Take 10 mg by mouth  daily.      . simvastatin (ZOCOR) 10 MG tablet Take 5 mg by mouth every morning.       . [DISCONTINUED] metoprolol tartrate (LOPRESSOR) 25 MG tablet Take 1 tablet (25 mg total) by mouth 2 (two) times daily.  30 tablet  0  . [DISCONTINUED] pantoprazole (PROTONIX) 40 MG tablet Take 1 tablet (40 mg total) by mouth daily at 12 noon.  10 tablet  0   No current facility-administered medications for this visit.    Allergies  Amoxicillin-pot clavulanate; Daliresp; Diltiazem; Montelukast sodium; and Zafirlukast  Electrocardiogram:  8/22 SR rate 114 PAC;s   Assessment  and Plan

## 2013-06-16 NOTE — Assessment & Plan Note (Signed)
Maint NSR no palpitatoins Not a great long term anticoagulation patient

## 2013-06-23 DIAGNOSIS — D1739 Benign lipomatous neoplasm of skin and subcutaneous tissue of other sites: Secondary | ICD-10-CM | POA: Diagnosis not present

## 2013-06-26 ENCOUNTER — Telehealth: Payer: Self-pay | Admitting: Internal Medicine

## 2013-06-26 NOTE — Telephone Encounter (Signed)
[name]-I agree with cardiac eval. She would like to see me earlier than scheduled, if available.

## 2013-06-26 NOTE — Telephone Encounter (Signed)
I spoke with pt and appt scheduled. Nothing further needed 

## 2013-06-26 NOTE — Telephone Encounter (Signed)
There are some open slots tomorrow and Wednesday with CY if patient would like to come in. Thanks.

## 2013-06-26 NOTE — Telephone Encounter (Signed)
I spoke with [name]. She reports her BS has been dropping into the 50-70's and this happens daily b/w 10-10:30. She went and saw PCP Friday and was advised it could be some of her medications causing this. He was not sure. [name] stated she eats breakfast and then takes her medications daily. She has been eating in b/w meals as well. She is wanting to know if any of her breathing medications could cause this. Also she stated her heart is not doing as "well as before" and not beating as well as it should per [name]. [name] may need a cardiac cath. She goes back to see her cards 07/10/13. She is wanting to know how Dr. Maple Hudson feels about this as well and wants an appt to discuss all this with him sooner than her appt scheduled. Please advise thanks Last OV 04/12/13 Pending 07/14/13 Allergies  Allergen Reactions  . Amoxicillin-Pot Clavulanate     GI upset  . Daliresp [Roflumilast] Other (See Comments)    unknown  . Diltiazem Nausea Only  . Montelukast Sodium     REACTION: flu-like symptoms  . Zafirlukast Other (See Comments)    unknown

## 2013-06-27 ENCOUNTER — Encounter: Payer: Self-pay | Admitting: Internal Medicine

## 2013-06-27 ENCOUNTER — Ambulatory Visit (INDEPENDENT_AMBULATORY_CARE_PROVIDER_SITE_OTHER): Payer: Medicare Other | Admitting: Internal Medicine

## 2013-06-27 VITALS — BP 136/82 | HR 115 | Ht 62.25 in | Wt 142.8 lb

## 2013-06-27 DIAGNOSIS — I509 Heart failure, unspecified: Secondary | ICD-10-CM

## 2013-06-27 DIAGNOSIS — J441 Chronic obstructive pulmonary disease with (acute) exacerbation: Secondary | ICD-10-CM

## 2013-06-27 DIAGNOSIS — Z23 Encounter for immunization: Secondary | ICD-10-CM

## 2013-06-27 NOTE — Patient Instructions (Signed)
Flu vax  The heart catheterization would give information that would help [place] understand your shortness of breath better, if Dr Eden Emms recommends it.
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
## 2013-06-28 DIAGNOSIS — S51809A Unspecified open wound of unspecified forearm, initial encounter: Secondary | ICD-10-CM | POA: Diagnosis not present

## 2013-07-05 DIAGNOSIS — S51809A Unspecified open wound of unspecified forearm, initial encounter: Secondary | ICD-10-CM | POA: Diagnosis not present

## 2013-07-10 ENCOUNTER — Encounter: Payer: Self-pay | Admitting: Cardiovascular Disease

## 2013-07-10 ENCOUNTER — Ambulatory Visit (INDEPENDENT_AMBULATORY_CARE_PROVIDER_SITE_OTHER)
Admission: RE | Admit: 2013-07-10 | Discharge: 2013-07-10 | Disposition: A | Discharge status: Home / Self Care | Payer: Medicare Other | Source: Ambulatory Visit | Attending: Cardiovascular Disease | Admitting: Cardiovascular Disease

## 2013-07-10 ENCOUNTER — Encounter: Payer: Self-pay | Admitting: *Deleted

## 2013-07-10 ENCOUNTER — Ambulatory Visit (INDEPENDENT_AMBULATORY_CARE_PROVIDER_SITE_OTHER): Payer: Medicare Other | Admitting: Cardiovascular Disease

## 2013-07-10 VITALS — BP 150/60 | HR 70 | Ht 62.0 in | Wt 143.0 lb

## 2013-07-10 DIAGNOSIS — J45909 Unspecified asthma, uncomplicated: Secondary | ICD-10-CM | POA: Diagnosis not present

## 2013-07-10 DIAGNOSIS — Z01810 Encounter for preprocedural cardiovascular examination: Secondary | ICD-10-CM

## 2013-07-10 DIAGNOSIS — Z79899 Other long term (current) drug therapy: Secondary | ICD-10-CM | POA: Diagnosis not present

## 2013-07-10 DIAGNOSIS — I359 Nonrheumatic aortic valve disorder, unspecified: Secondary | ICD-10-CM | POA: Diagnosis not present

## 2013-07-10 DIAGNOSIS — I509 Heart failure, unspecified: Secondary | ICD-10-CM

## 2013-07-10 DIAGNOSIS — I10 Essential (primary) hypertension: Secondary | ICD-10-CM | POA: Diagnosis not present

## 2013-07-10 LAB — BASIC METABOLIC PANEL
BUN: 12 mg/dL (ref 6–23)
CO2: 35 mEq/L — ABNORMAL HIGH (ref 19–32)
Calcium: 9.4 mg/dL (ref 8.4–10.5)
Chloride: 101 mEq/L (ref 96–112)
GFR: 111.21 mL/min (ref >60.00)
Glucose, Bld: 101 mg/dL — ABNORMAL HIGH (ref 70–99)

## 2013-07-10 LAB — CBC WITH DIFFERENTIAL/PLATELET
Basophils Relative: 0.2 % (ref 0.0–3.0)
Eosinophils Relative: 0.2 % (ref 0.0–5.0)
HCT: 39.4 % (ref 36.0–46.0)
Lymphs Abs: 1.1 10*3/uL (ref 0.7–4.0)
MCV: 87.6 fl (ref 78.0–100.0)
Monocytes Absolute: 0.6 10*3/uL (ref 0.1–1.0)
Platelets: 265 10*3/uL (ref 150.0–400.0)
WBC: 10.7 10*3/uL — ABNORMAL HIGH (ref 4.5–10.5)

## 2013-07-10 NOTE — Assessment & Plan Note (Signed)
Well controlled.  Continue current medications and low sodium Dash type diet.    

## 2013-07-10 NOTE — Assessment & Plan Note (Signed)
F/U Dr Maple Hudson  Continue steroids  Right heart cath planned to assess PA pressures

## 2013-07-10 NOTE — Assessment & Plan Note (Signed)
Continue ARB and HCTZ  Pending right heart cath may need to add beta blocker and escalate doses.  Would not add beta blocker unless she has CAD given extent of her lung disease

## 2013-07-10 NOTE — Assessment & Plan Note (Signed)
Not clear how much ? CAD/AS is contibuting to her symptoms.  BNP was normal Right and left heart cath with Dr Excell Seltzer on Thursday

## 2013-07-10 NOTE — Assessment & Plan Note (Signed)
I gave permission for her to go ahead with cardiac catheterization if that's what she and her cardiologist choose.

## 2013-07-10 NOTE — Assessment & Plan Note (Signed)
Acute exacerbation of COPD.  Can't tell that she has infection. We discussed the overlap between pulmonary and cardiac symptoms. Plan-continue present meds. Flu vaccine

## 2013-07-10 NOTE — Patient Instructions (Signed)
Your physician has requested that you have a cardiac catheterization. Cardiac catheterization is used to diagnose and/or treat various heart conditions. Doctors may recommend this procedure for a number of different reasons. The most common reason is to evaluate chest pain. Chest pain can be a symptom of coronary artery disease (CAD), and cardiac catheterization can show whether plaque is narrowing or blocking your heart's arteries. This procedure is also used to evaluate the valves, as well as measure the blood flow and oxygen levels in different parts of your heart. For further information please visit [email or web address] Please follow instruction sheet, as given.  A chest x-ray takes a picture of the organs and structures inside the chest, including the heart, lungs, and blood vessels. This test can show several things, including, whether the heart is enlarges; whether fluid is building up in the lungs; and whether pacemaker / defibrillator leads are still in place.TODAY Your physician recommends that you return for lab work in: BMET  CBC  INR

## 2013-07-10 NOTE — Progress Notes (Signed)
Patient ID: [name], female   DOB: 09/10/1933, 80 y.o.   MRN: [id number] 80 yo with sig. COPD sees Dr Young. History of mild to moderate AS and PAF. Having issues with asthma and allergies. Taking benedryl. Has feeling that skin is too tight on her feet Primary concerned about circulation. Had laceration in RLE that he sewed up that got infected Still erythematous. No claudication on exam. Dyspnea chronic from lung disease. No chest pain or syncope.  Echo 8/14  Study Conclusions  - Left ventricle: The cavity size was moderately dilated. Wall thickness was normal. Systolic function was severely reduced. The estimated ejection fraction was in the range of 25% to 30%. Diffuse hypokinesis. - Aortic valve: Moderately calcified with likely moderate AS given degree of LV dysfunction - Mitral valve: Mild regurgitation. - Left atrium: The atrium was mildly dilated. - Atrial septum: No defect or patent foramen ovale was identified.  Venous duplex 8/14 no DVT  Discussed results of echo She is not sure she wants cath. She is on HCTZ May benefit from beta blocker but with advanced lung disease may worsen symptoms  BNP was normal 4 weeks agoa  She is under some stress caring for her husband of 59 years with dementia.  Burnt Right wrist while cooking and cat scratched her Got antibiotics and silvadene with improvement  She is willing to have heart cath.  She has had worsening dyspnea and chest tightness since I last saw her Increased prednisone not helping    ROS: Denies fever, malais, weight loss, blurry vision, decreased visual acuity, cough, sputum, SOB, hemoptysis, pleuritic pain, palpitaitons, heartburn, abdominal pain, melena, lower extremity edema, claudication, or rash.  All other systems reviewed and negative  General: Affect appropriate Frail elderly female HEENT: normal Neck supple with no adenopathy JVP normal no bruits no thyromegaly Lungs clear with no wheezing and good  diaphragmatic motion Heart:  S1/S2 AS  murmur, no rub, gallop or click PMI normal Abdomen: benighn, BS positve, no tenderness, no AAA no bruit.  No HSM or HJR Distal pulses intact with no bruits No edema Neuro non-focal Skin warm and dry No muscular weakness   Current Outpatient Prescriptions  Medication Sig Dispense Refill  . albuterol (PROAIR HFA) 108 (90 BASE) MCG/ACT inhaler Inhale 2 puffs into the lungs every 6 (six) hours as needed for wheezing or shortness of breath. For shortness of breath  1 Inhaler  prn  . budesonide (PULMICORT) 0.25 MG/2ML nebulizer solution 1 neb twice daily  360 mL  3  . Dextromethorphan-Guaifenesin 60-1200 MG per 12 hr tablet Take 1 tablet by mouth every 12 (twelve) hours.        . doxycycline (VIBRAMYCIN) 100 MG capsule 2 today, then 1 daily until gone  8 capsule  0  . formoterol (PERFOROMIST) 20 MCG/2ML nebulizer solution Take 2 mLs (20 mcg total) by nebulization 2 (two) times daily.  120 mL  prn  . hydrochlorothiazide 25 MG tablet Take 25 mg by mouth daily.        . ipratropium (ATROVENT) 0.02 % nebulizer solution Take 2.5 mLs (0.5 mg total) by nebulization 3 (three) times daily.  75 mL  6  . levalbuterol (XOPENEX) 0.63 MG/3ML nebulizer solution USE 1 VIAL EVERY 6 HOURS AS NEEDED FOR WHEEZING OR SHORTNESS OF BREATH  360 mL  9  . loratadine (CLARITIN) 10 MG tablet Take 10 mg by mouth daily.      . losartan (COZAAR) 25 MG tablet Take 1 tablet (25   mg total) by mouth daily.  30 tablet  11  . omeprazole (PRILOSEC) 20 MG capsule Take 1 tablet by mouth Twice daily.      . predniSONE (DELTASONE) 10 MG tablet Take 10 mg by mouth daily.      . simvastatin (ZOCOR) 10 MG tablet Take 5 mg by mouth every morning.       . [DISCONTINUED] metoprolol tartrate (LOPRESSOR) 25 MG tablet Take 1 tablet (25 mg total) by mouth 2 (two) times daily.  30 tablet  0  . [DISCONTINUED] pantoprazole (PROTONIX) 40 MG tablet Take 1 tablet (40 mg total) by mouth daily at 12 noon.  10 tablet   0   No current facility-administered medications for this visit.    Allergies  Amoxicillin-pot clavulanate; Daliresp; Diltiazem; Montelukast sodium; and Zafirlukast  Electrocardiogram:  05/12/13 SR rate 114 PVC poor R wave progression  Assessment and Plan  

## 2013-07-11 ENCOUNTER — Other Ambulatory Visit: Payer: Self-pay | Admitting: Cardiovascular Disease

## 2013-07-11 DIAGNOSIS — I509 Heart failure, unspecified: Secondary | ICD-10-CM

## 2013-07-12 ENCOUNTER — Encounter (HOSPITAL_COMMUNITY): Payer: Self-pay | Admitting: Pharmacy Technician

## 2013-07-12 DIAGNOSIS — S51809A Unspecified open wound of unspecified forearm, initial encounter: Secondary | ICD-10-CM | POA: Diagnosis not present

## 2013-07-13 ENCOUNTER — Encounter (HOSPITAL_COMMUNITY)
Admission: RE | Disposition: A | Discharge status: Home / Self Care | Payer: Self-pay | Source: Ambulatory Visit | Attending: Cardiovascular Disease

## 2013-07-13 ENCOUNTER — Ambulatory Visit (HOSPITAL_COMMUNITY)
Admission: RE | Admit: 2013-07-13 | Discharge: 2013-07-13 | Disposition: A | Discharge status: Home / Self Care | Payer: Medicare Other | Source: Ambulatory Visit | Attending: Cardiovascular Disease | Admitting: Cardiovascular Disease

## 2013-07-13 DIAGNOSIS — I509 Heart failure, unspecified: Secondary | ICD-10-CM | POA: Diagnosis not present

## 2013-07-13 DIAGNOSIS — I428 Other cardiomyopathies: Secondary | ICD-10-CM | POA: Insufficient documentation

## 2013-07-13 DIAGNOSIS — J449 Chronic obstructive pulmonary disease, unspecified: Secondary | ICD-10-CM | POA: Insufficient documentation

## 2013-07-13 DIAGNOSIS — Z79899 Other long term (current) drug therapy: Secondary | ICD-10-CM | POA: Diagnosis not present

## 2013-07-13 DIAGNOSIS — I359 Nonrheumatic aortic valve disorder, unspecified: Secondary | ICD-10-CM | POA: Insufficient documentation

## 2013-07-13 DIAGNOSIS — J4489 Other specified chronic obstructive pulmonary disease: Secondary | ICD-10-CM | POA: Insufficient documentation

## 2013-07-13 HISTORY — PX: LEFT AND RIGHT HEART CATHETERIZATION WITH CORONARY ANGIOGRAM: SHX5449

## 2013-07-13 LAB — POCT I-STAT 3, VENOUS BLOOD GAS (G3P V)
Acid-Base Excess: 6 mmol/L — ABNORMAL HIGH (ref 0.0–2.0)
Bicarbonate: 30.7 mEq/L — ABNORMAL HIGH (ref 20.0–24.0)
O2 Saturation: 63 %
pCO2, Ven: 44.1 mmHg — ABNORMAL LOW (ref 45.0–50.0)
pO2, Ven: 32 mmHg (ref 30.0–45.0)

## 2013-07-13 LAB — POCT I-STAT 3, ART BLOOD GAS (G3+)
Acid-Base Excess: 6 mmol/L — ABNORMAL HIGH (ref 0.0–2.0)
Bicarbonate: 31.1 mEq/L — ABNORMAL HIGH (ref 20.0–24.0)
O2 Saturation: 91 %
TCO2: 32 mmol/L (ref 0–100)
pCO2 arterial: 44 mmHg (ref 35.0–45.0)
pO2, Arterial: 58 mmHg — ABNORMAL LOW (ref 80.0–100.0)

## 2013-07-13 SURGERY — LEFT AND RIGHT HEART CATHETERIZATION WITH CORONARY ANGIOGRAM
Anesthesia: LOCAL

## 2013-07-13 MED ORDER — SODIUM CHLORIDE 0.9 % IJ SOLN: 3.0000 mL | INTRAMUSCULAR | Status: DC | PRN

## 2013-07-13 MED ORDER — HEPARIN (PORCINE) IN NACL 2-0.9 UNIT/ML-% IJ SOLN
INTRAMUSCULAR | Status: AC
  Filled 2013-07-13: qty 1500

## 2013-07-13 MED ORDER — SODIUM CHLORIDE 0.9 % IJ SOLN: 3.0000 mL | Freq: Two times a day (BID) | INTRAMUSCULAR | Status: DC

## 2013-07-13 MED ORDER — SODIUM CHLORIDE 0.9 % IV SOLN: INTRAVENOUS | Status: DC

## 2013-07-13 MED ORDER — ONDANSETRON HCL 4 MG/2ML IJ SOLN: 4.0000 mg | Freq: Four times a day (QID) | INTRAMUSCULAR | Status: DC | PRN

## 2013-07-13 MED ORDER — MIDAZOLAM HCL 2 MG/2ML IJ SOLN
INTRAMUSCULAR | Status: AC
  Filled 2013-07-13: qty 2

## 2013-07-13 MED ORDER — FENTANYL CITRATE 0.05 MG/ML IJ SOLN
INTRAMUSCULAR | Status: AC
  Filled 2013-07-13: qty 2

## 2013-07-13 MED ORDER — SODIUM CHLORIDE 0.9 % IV SOLN: 1.0000 mL/kg/h | INTRAVENOUS | Status: DC

## 2013-07-13 MED ORDER — ACETAMINOPHEN 325 MG PO TABS: 650.0000 mg | ORAL_TABLET | ORAL | Status: DC | PRN

## 2013-07-13 MED ORDER — LIDOCAINE HCL (PF) 1 % IJ SOLN
INTRAMUSCULAR | Status: AC
  Filled 2013-07-13: qty 30

## 2013-07-13 MED ORDER — SODIUM CHLORIDE 0.9 % IV SOLN: 250.0000 mL | INTRAVENOUS | Status: DC | PRN

## 2013-07-13 MED ORDER — OXYCODONE-ACETAMINOPHEN 5-325 MG PO TABS: 1.0000 | ORAL_TABLET | ORAL | Status: DC | PRN

## 2013-07-13 MED ORDER — ASPIRIN 81 MG PO CHEW
81.0000 mg | CHEWABLE_TABLET | ORAL | Status: DC
  Filled 2013-07-13: qty 1

## 2013-07-13 MED ORDER — NITROGLYCERIN 0.2 MG/ML ON CALL CATH LAB
INTRAVENOUS | Status: AC
  Filled 2013-07-13: qty 1

## 2013-07-13 NOTE — CV Procedure (Signed)
   Cardiac Catheterization Procedure Note  Name: [name] MRN: [id number] DOB: 1932-09-25  Procedure: Right Heart Cath, Left Heart Cath, Selective Coronary Angiography, LV angiography  Indication: CHF, aortic stenosis.   Procedural Details: The right groin was prepped, draped, and anesthetized with 1% lidocaine. Using the modified Seldinger technique a 5 French sheath was placed in the right femoral artery and a 6 French sheath was placed in the right femoral vein. A multipurpose catheter was used for the right heart catheterization. Standard protocol was followed for recording of right heart pressures and sampling of oxygen saturations. Fick cardiac output was calculated. Standard Judkins catheters were used for selective coronary angiography and left ventriculography. There were no immediate procedural complications. The patient was transferred to the post catheterization recovery area for further monitoring.  Procedural Findings: Hemodynamics RA 9 RV 36/9 PA 40/14 mean 27 PCWP 17 LV 122/11 AO 122/74 mean 89  Oxygen saturations: PA 63 AO 91  Cardiac Output (Fick) 4.4  Cardiac Index (Fick) 2.63   Coronary angiography: Coronary dominance: right  Left mainstem: No stenosis, arises from left cusp.   Left anterior descending (LAD): Patent to the distal anterior wall. No significant stenosis throughout  Left circumflex (LCx): Widely patent. OM2 and OM3 are the major branches and they have no stenosis.   Right coronary artery (RCA): normal caliber vessel with mild 20-30% mid-vessel stenosis. PDA is large without significant disease.   Left ventriculography: Left ventricular systolic function is moderately depressed (global). The LVEF is estimated at 35-40%.   Final Conclusions:   1. Minimal nonobstructive CAD 2. No significant aortic stenosis (no transvalvular gradient with simultaneous pressures) 3. Moderately severe global LV dysfunction  Recommendations: med Rx for  cardiomyopathy.  Tonny Bollman 07/13/2013, 4:16 PM

## 2013-07-13 NOTE — Interval H&P Note (Signed)
History and Physical Interval Note:  07/13/2013 3:34 PM  [name]  has presented today for surgery, with the diagnosis of as  The various methods of treatment have been discussed with the patient and family. After consideration of risks, benefits and other options for treatment, the patient has consented to  Procedure(s): LEFT AND RIGHT HEART CATHETERIZATION WITH CORONARY ANGIOGRAM (N/A) as a surgical intervention .  The patient's history has been reviewed, patient examined, no change in status, stable for surgery.  I have reviewed the patient's chart and labs.  Questions were answered to the patient's satisfaction.  Cath Lab Visit (complete for each Cath Lab visit)  Clinical Evaluation Leading to the Procedure:   ACS: no  Non-ACS:    Anginal Classification: No Symptoms  Anti-ischemic medical therapy: No Therapy  Non-Invasive Test Results: No non-invasive testing performed  Prior CABG: No previous CABG           Tonny Bollman

## 2013-07-13 NOTE — Progress Notes (Signed)
Discharge intstruction given per MD order. Pt was able to verbalized understanding.  Pt denies any pain at groin site at this time.  Pt to car via wheelchair.

## 2013-07-13 NOTE — H&P (View-Only) (Signed)
Patient ID: [name], female   DOB: 22-Feb-1933, 79 y.o.   MRN: [id number] 79 yo with sig. COPD sees Dr Maple Hudson. History of mild to moderate AS and PAF. Having issues with asthma and allergies. Taking benedryl. Has feeling that skin is too tight on her feet Primary concerned about circulation. Had laceration in RLE that he sewed up that got infected Still erythematous. No claudication on exam. Dyspnea chronic from lung disease. No chest pain or syncope.  Echo 8/14  Study Conclusions  - Left ventricle: The cavity size was moderately dilated. Wall thickness was normal. Systolic function was severely reduced. The estimated ejection fraction was in the range of 25% to 30%. Diffuse hypokinesis. - Aortic valve: Moderately calcified with likely moderate AS given degree of LV dysfunction - Mitral valve: Mild regurgitation. - Left atrium: The atrium was mildly dilated. - Atrial septum: No defect or patent foramen ovale was identified.  Venous duplex 8/14 no DVT  Discussed results of echo She is not sure she wants cath. She is on HCTZ May benefit from beta blocker but with advanced lung disease may worsen symptoms  BNP was normal 4 weeks agoa  She is under some stress caring for her husband of 59 years with dementia.  Burnt Right wrist while cooking and cat scratched her Got antibiotics and silvadene with improvement  She is willing to have heart cath.  She has had worsening dyspnea and chest tightness since I last saw her Increased prednisone not helping    ROS: Denies fever, malais, weight loss, blurry vision, decreased visual acuity, cough, sputum, SOB, hemoptysis, pleuritic pain, palpitaitons, heartburn, abdominal pain, melena, lower extremity edema, claudication, or rash.  All other systems reviewed and negative  General: Affect appropriate Frail elderly female HEENT: normal Neck supple with no adenopathy JVP normal no bruits no thyromegaly Lungs clear with no wheezing and good  diaphragmatic motion Heart:  S1/S2 AS  murmur, no rub, gallop or click PMI normal Abdomen: benighn, BS positve, no tenderness, no AAA no bruit.  No HSM or HJR Distal pulses intact with no bruits No edema Neuro non-focal Skin warm and dry No muscular weakness   Current Outpatient Prescriptions  Medication Sig Dispense Refill  . albuterol (PROAIR HFA) 108 (90 BASE) MCG/ACT inhaler Inhale 2 puffs into the lungs every 6 (six) hours as needed for wheezing or shortness of breath. For shortness of breath  1 Inhaler  prn  . budesonide (PULMICORT) 0.25 MG/2ML nebulizer solution 1 neb twice daily  360 mL  3  . Dextromethorphan-Guaifenesin 60-1200 MG per 12 hr tablet Take 1 tablet by mouth every 12 (twelve) hours.        Marland Kitchen doxycycline (VIBRAMYCIN) 100 MG capsule 2 today, then 1 daily until gone  8 capsule  0  . formoterol (PERFOROMIST) 20 MCG/2ML nebulizer solution Take 2 mLs (20 mcg total) by nebulization 2 (two) times daily.  120 mL  prn  . hydrochlorothiazide 25 MG tablet Take 25 mg by mouth daily.        Marland Kitchen ipratropium (ATROVENT) 0.02 % nebulizer solution Take 2.5 mLs (0.5 mg total) by nebulization 3 (three) times daily.  75 mL  6  . levalbuterol (XOPENEX) 0.63 MG/3ML nebulizer solution USE 1 VIAL EVERY 6 HOURS AS NEEDED FOR WHEEZING OR SHORTNESS OF BREATH  360 mL  9  . loratadine (CLARITIN) 10 MG tablet Take 10 mg by mouth daily.      Marland Kitchen losartan (COZAAR) 25 MG tablet Take 1 tablet (25  mg total) by mouth daily.  30 tablet  11  . omeprazole (PRILOSEC) 20 MG capsule Take 1 tablet by mouth Twice daily.      . predniSONE (DELTASONE) 10 MG tablet Take 10 mg by mouth daily.      . simvastatin (ZOCOR) 10 MG tablet Take 5 mg by mouth every morning.       . [DISCONTINUED] metoprolol tartrate (LOPRESSOR) 25 MG tablet Take 1 tablet (25 mg total) by mouth 2 (two) times daily.  30 tablet  0  . [DISCONTINUED] pantoprazole (PROTONIX) 40 MG tablet Take 1 tablet (40 mg total) by mouth daily at 12 noon.  10 tablet   0   No current facility-administered medications for this visit.    Allergies  Amoxicillin-pot clavulanate; Daliresp; Diltiazem; Montelukast sodium; and Zafirlukast  Electrocardiogram:  05/12/13 SR rate 114 PVC poor R wave progression  Assessment and Plan

## 2013-07-14 ENCOUNTER — Ambulatory Visit: Payer: Medicare Other | Admitting: Internal Medicine

## 2013-07-19 ENCOUNTER — Ambulatory Visit (INDEPENDENT_AMBULATORY_CARE_PROVIDER_SITE_OTHER): Payer: Medicare Other | Admitting: Cardiovascular Disease

## 2013-07-19 ENCOUNTER — Encounter: Payer: Self-pay | Admitting: Cardiovascular Disease

## 2013-07-19 VITALS — BP 110/64 | HR 54 | Ht 62.5 in | Wt 142.0 lb

## 2013-07-19 DIAGNOSIS — I4891 Unspecified atrial fibrillation: Secondary | ICD-10-CM | POA: Diagnosis not present

## 2013-07-19 DIAGNOSIS — I509 Heart failure, unspecified: Secondary | ICD-10-CM | POA: Diagnosis not present

## 2013-07-19 DIAGNOSIS — I480 Paroxysmal atrial fibrillation: Secondary | ICD-10-CM

## 2013-07-19 DIAGNOSIS — I10 Essential (primary) hypertension: Secondary | ICD-10-CM

## 2013-07-19 DIAGNOSIS — I359 Nonrheumatic aortic valve disorder, unspecified: Secondary | ICD-10-CM | POA: Diagnosis not present

## 2013-07-19 NOTE — Progress Notes (Signed)
Patient ID: [name], female   DOB: 1933-01-02, 79 y.o.   MRN: [id number] 79 yo with sig. COPD sees Dr Maple Hudson. History of mild to moderate AS and PAF. Having issues with asthma and allergies. Taking benedryl. Has feeling that skin is too tight on her feet Primary concerned about circulation. Had laceration in RLE that he sewed up that got infected Still erythematous. No claudication on exam. Dyspnea chronic from lung disease. No chest pain or syncope.  Echo 8/14  Study Conclusions  - Left ventricle: The cavity size was moderately dilated. Wall thickness was normal. Systolic function was severely reduced. The estimated ejection fraction was in the range of 25% to 30%. Diffuse hypokinesis. - Aortic valve: Moderately calcified with likely moderate AS given degree of LV dysfunction - Mitral valve: Mild regurgitation. - Left atrium: The atrium was mildly dilated. - Atrial septum: No defect or patent foramen ovale was identified.  Venous duplex 8/14 no DVT   Cath 07/13/13 by Dr Excell Seltzer no significant AS and no CAD  Left mainstem: No stenosis, arises from left cusp.  Left anterior descending (LAD): Patent to the distal anterior wall. No significant stenosis throughout  Left circumflex (LCx): Widely patent. OM2 and OM3 are the major branches and they have no stenosis.  Right coronary artery (RCA): normal caliber vessel with mild 20-30% mid-vessel stenosis. PDA is large without significant disease.  Left ventriculography: Left ventricular systolic function is moderately depressed (global). The LVEF is estimated at 35-40%.  Final Conclusions:  1. Minimal nonobstructive CAD  2. No significant aortic stenosis (no transvalvular gradient with simultaneous pressures)  3. Moderately severe global LV dysfunction  Recommendations: med Rx for cardiomyopathy.  Right heart pressures ok with no pulmonary hypertension Hemodynamics  RA 9  RV 36/9  PA 40/14 mean 27  PCWP 17  LV 122/11  AO 122/74 mean 89   Oxygen saturations:  PA 63  AO 91  Cardiac Output (Fick) 4.4  Cardiac Index (Fick) 2.63    BNP a month ago only 93  Tolerating low dose ARB and diuretic   ROS: Denies fever, malais, weight loss, blurry vision, decreased visual acuity, cough, sputum, SOB, hemoptysis, pleuritic pain, palpitaitons, heartburn, abdominal pain, melena, lower extremity edema, claudication, or rash.  All other systems reviewed and negative  General: Affect appropriate Frail elderly female  HEENT: normal Neck supple with no adenopathy JVP normal no bruits no thyromegaly Lungs poor breath sounds with end expitory wheezing and good diaphragmatic motion Heart:  S1/S2 mild AS murmur, no rub, gallop or click PMI normal Abdomen: benighn, BS positve, no tenderness, no AAA no bruit.  No HSM or HJR Distal pulses intact with no bruits No edema Neuro non-focal Skin warm and dry No muscular weakness   Current Outpatient Prescriptions  Medication Sig Dispense Refill  . albuterol (PROAIR HFA) 108 (90 BASE) MCG/ACT inhaler Inhale 2 puffs into the lungs every 6 (six) hours as needed for wheezing or shortness of breath. For shortness of breath  1 Inhaler  prn  . ALPRAZolam (XANAX) 0.25 MG tablet Take 0.25 mg by mouth daily.      . budesonide (PULMICORT) 0.25 MG/2ML nebulizer solution Take 0.25 mg by nebulization 2 (two) times daily.      . formoterol (PERFOROMIST) 20 MCG/2ML nebulizer solution Take 2 mLs (20 mcg total) by nebulization 2 (two) times daily.  120 mL  prn  . guaiFENesin (MUCINEX) 600 MG 12 hr tablet Take 600 mg by mouth daily.      Marland Kitchen  hydrochlorothiazide 25 MG tablet Take 25 mg by mouth daily.        Marland Kitchen ipratropium (ATROVENT) 0.02 % nebulizer solution Take 2.5 mLs (0.5 mg total) by nebulization 3 (three) times daily.  75 mL  6  . levalbuterol (XOPENEX) 0.63 MG/3ML nebulizer solution Take 1 ampule by nebulization every 6 (six) hours as needed for wheezing.      Marland Kitchen loratadine (CLARITIN) 10 MG tablet Take 10  mg by mouth daily.      Marland Kitchen losartan (COZAAR) 25 MG tablet Take 1 tablet (25 mg total) by mouth daily.  30 tablet  11  . omeprazole (PRILOSEC) 20 MG capsule Take 1 tablet by mouth Twice daily.      . predniSONE (DELTASONE) 10 MG tablet Take 10 mg by mouth daily.      . silver sulfADIAZINE (SILVADENE) 1 % cream Apply 1 application topically 3 (three) times daily.      . simvastatin (ZOCOR) 20 MG tablet Take 20 mg by mouth every evening.      . [DISCONTINUED] metoprolol tartrate (LOPRESSOR) 25 MG tablet Take 1 tablet (25 mg total) by mouth 2 (two) times daily.  30 tablet  0  . [DISCONTINUED] pantoprazole (PROTONIX) 40 MG tablet Take 1 tablet (40 mg total) by mouth daily at 12 noon.  10 tablet  0   No current facility-administered medications for this visit.    Allergies  Amoxicillin-pot clavulanate; Daliresp; Diltiazem; Montelukast sodium; and Zafirlukast  Electrocardiogram: 10/23  SR rate 85 pulmonary disease pattern PVC   Assessment and Plan

## 2013-07-19 NOTE — Assessment & Plan Note (Signed)
Maint NSR  No need for anticoagulation at this time

## 2013-07-19 NOTE — Assessment & Plan Note (Signed)
Stable with low pressures at cath and BNP under 100

## 2013-07-19 NOTE — Assessment & Plan Note (Signed)
Well controlled.  Continue current medications and low sodium Dash type diet.    

## 2013-07-19 NOTE — Assessment & Plan Note (Signed)
Not a surgical candidate no significant pull back gradient at cath

## 2013-07-19 NOTE — Patient Instructions (Signed)
Your physician wants you to follow-up in:   3 MONTHS WITH  DR NISHAN  You will receive a reminder letter in the mail two months in advance. If you don't receive a letter, please call our office to schedule the follow-up appointment. Your physician recommends that you continue on your current medications as directed. Please refer to the Current Medication list given to you today.  

## 2013-07-20 DIAGNOSIS — S51809A Unspecified open wound of unspecified forearm, initial encounter: Secondary | ICD-10-CM | POA: Diagnosis not present

## 2013-07-26 ENCOUNTER — Telehealth: Payer: Self-pay | Admitting: Internal Medicine

## 2013-07-26 DIAGNOSIS — J449 Chronic obstructive pulmonary disease, unspecified: Secondary | ICD-10-CM | POA: Diagnosis not present

## 2013-07-26 DIAGNOSIS — E785 Hyperlipidemia, unspecified: Secondary | ICD-10-CM | POA: Diagnosis not present

## 2013-07-26 DIAGNOSIS — Z1211 Encounter for screening for malignant neoplasm of colon: Secondary | ICD-10-CM | POA: Diagnosis not present

## 2013-07-26 DIAGNOSIS — I10 Essential (primary) hypertension: Secondary | ICD-10-CM | POA: Diagnosis not present

## 2013-07-26 DIAGNOSIS — G35 Multiple sclerosis: Secondary | ICD-10-CM | POA: Diagnosis not present

## 2013-07-26 NOTE — Telephone Encounter (Signed)
I spoke with pt. She takes pred 10 mg daily. When she has days of increased SOB she was told she could take up to 60 mg daily for a couple days. Since her RX was only for #30 she runs out sooner. She had to increase her dose last week. Pt is requesting we send in RX for prednisone #100. Please advise Dr. Maple Hudson thanks Last OV 06/27/13 09/24/13 pending

## 2013-07-27 MED ORDER — PREDNISONE 10 MG PO TABS: ORAL_TABLET | ORAL | Status: DC

## 2013-07-27 NOTE — Telephone Encounter (Signed)
Ok to refill for # 100 tabs- 1 daily or as directd

## 2013-07-27 NOTE — Telephone Encounter (Signed)
Pt is aware and rx has been sent.

## 2013-07-28 ENCOUNTER — Telehealth: Payer: Self-pay | Admitting: Internal Medicine

## 2013-07-28 NOTE — Telephone Encounter (Signed)
Ok as requested

## 2013-07-28 NOTE — Telephone Encounter (Signed)
I spoke with [place]. She reports pt insurance is requesting specific directions on her pred RX. We sent in yesterday pred 10 mg take 1 daily or as directed #100. Pt is telling [facility] somce days she takes 60 mg daily. They are wanting the okay for RX to state pt can take up to 6 tabs daily. Please advise Dr. Maple Hudson thanks

## 2013-07-28 NOTE — Telephone Encounter (Signed)
I spoke with [facility] and gave VO for up to 6 daily. Nothing further needed

## 2013-07-28 NOTE — Telephone Encounter (Signed)
Pt states that the pharmacy is needing a new Rx b/c pt's insurance will not approve the way the Rx is written.  Pt can be reached at  [phone].   [name]

## 2013-08-01 ENCOUNTER — Telehealth: Payer: Self-pay | Admitting: Internal Medicine

## 2013-08-01 MED ORDER — DOXYCYCLINE HYCLATE 100 MG PO TABS: ORAL_TABLET | ORAL | Status: DC

## 2013-08-01 NOTE — Telephone Encounter (Signed)
Per CY - follow original pred taper, Doxy 100mg  #8 2 today, then 1 daily.  Pt is aware of CY recs. Rx has been sent in.

## 2013-08-01 NOTE — Telephone Encounter (Signed)
Suggest she increase prednisone to 15 mg daily until she feels better. If she doesn't think that will help the problem, please let us know.

## 2013-08-01 NOTE — Telephone Encounter (Signed)
Spoke with pt .  Pt states that she may not have explained her dose of prednisone correctly.  Pt states that she took Pred 60mg  for 3 days and is now on the second day of 50 mg daily and is still having symptoms.  She thinks she may need a mild abx.  Please advise on this and also prednisone dose.

## 2013-08-01 NOTE — Telephone Encounter (Signed)
Last visit 06-27-13. Pt states she was having some increased wheezing and SOB so she increased her prednisone to 60mg  x 3 days and then decreased to 5 mg and has been on this x 2 days. She states she does not feel better. She states the wheezing is some better but still there. She also states she is not having a lot of PND, dry cough, and sinus congestion. She states overall she does not feel well. Pt asking for further recs. Please advise. [name], CMA Allergies  Allergen Reactions  . Amoxicillin-Pot Clavulanate     GI upset  . Daliresp [Roflumilast] Other (See Comments)    unknown  . Diltiazem Nausea Only  . Montelukast Sodium     REACTION: flu-like symptoms  . Zafirlukast Other (See Comments)    unknown    Current Outpatient Prescriptions on File Prior to Visit  Medication Sig Dispense Refill  . albuterol (PROAIR HFA) 108 (90 BASE) MCG/ACT inhaler Inhale 2 puffs into the lungs every 6 (six) hours as needed for wheezing or shortness of breath. For shortness of breath  1 Inhaler  prn  . ALPRAZolam (XANAX) 0.25 MG tablet Take 0.25 mg by mouth daily.      . budesonide (PULMICORT) 0.25 MG/2ML nebulizer solution Take 0.25 mg by nebulization 2 (two) times daily.      . formoterol (PERFOROMIST) 20 MCG/2ML nebulizer solution Take 2 mLs (20 mcg total) by nebulization 2 (two) times daily.  120 mL  prn  . guaiFENesin (MUCINEX) 600 MG 12 hr tablet Take 600 mg by mouth daily.      . hydrochlorothiazide 25 MG tablet Take 25 mg by mouth daily.        Marland Kitchen ipratropium (ATROVENT) 0.02 % nebulizer solution Take 2.5 mLs (0.5 mg total) by nebulization 3 (three) times daily.  75 mL  6  . levalbuterol (XOPENEX) 0.63 MG/3ML nebulizer solution Take 1 ampule by nebulization every 6 (six) hours as needed for wheezing.      Marland Kitchen loratadine (CLARITIN) 10 MG tablet Take 10 mg by mouth daily.      Marland Kitchen losartan (COZAAR) 25 MG tablet Take 1 tablet (25 mg total) by mouth daily.  30 tablet  11  . omeprazole (PRILOSEC) 20  MG capsule Take 1 tablet by mouth Twice daily.      . predniSONE (DELTASONE) 10 MG tablet Take 1 daily or as directed  100 tablet  0  . silver sulfADIAZINE (SILVADENE) 1 % cream Apply 1 application topically 3 (three) times daily.      . simvastatin (ZOCOR) 20 MG tablet Take 20 mg by mouth every evening.      . [DISCONTINUED] metoprolol tartrate (LOPRESSOR) 25 MG tablet Take 1 tablet (25 mg total) by mouth 2 (two) times daily.  30 tablet  0  . [DISCONTINUED] pantoprazole (PROTONIX) 40 MG tablet Take 1 tablet (40 mg total) by mouth daily at 12 noon.  10 tablet  0   No current facility-administered medications on file prior to visit.

## 2013-09-04 ENCOUNTER — Telehealth: Payer: Self-pay | Admitting: Internal Medicine

## 2013-09-04 NOTE — Telephone Encounter (Signed)
I called and spoke with pt. She requesting an appt with CDY this week. Pt is scheduled to come in tomorrow to see CDY at 1:45. Nothing further needed

## 2013-09-05 ENCOUNTER — Ambulatory Visit (INDEPENDENT_AMBULATORY_CARE_PROVIDER_SITE_OTHER): Payer: Medicare Other | Admitting: Internal Medicine

## 2013-09-05 ENCOUNTER — Telehealth: Payer: Self-pay | Admitting: Internal Medicine

## 2013-09-05 ENCOUNTER — Encounter: Payer: Self-pay | Admitting: Internal Medicine

## 2013-09-05 VITALS — BP 130/64 | HR 124 | Ht 62.25 in | Wt 145.0 lb

## 2013-09-05 DIAGNOSIS — J441 Chronic obstructive pulmonary disease with (acute) exacerbation: Secondary | ICD-10-CM | POA: Diagnosis not present

## 2013-09-05 MED ORDER — PREDNISONE 10 MG PO TABS: ORAL_TABLET | ORAL | Status: DC

## 2013-09-05 NOTE — Telephone Encounter (Signed)
Pt called to let CDY know that she was only doing xopenex x1 daily instead of every six hours.  Will start using every six hours and CDY was requesting confirmation on med list per OV today.

## 2013-09-05 NOTE — Patient Instructions (Signed)
Increase prednisone when needed up to 60 mg for 2-3 days, then gradually taper back down over a week or so to 10 mg daily, or the lowest dose that holds you.   Keep January 7 appointment
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
## 2013-09-06 ENCOUNTER — Telehealth: Payer: Self-pay | Admitting: Internal Medicine

## 2013-09-06 NOTE — Telephone Encounter (Signed)
I called and spoke with Raynelle Fanning. They are needing clarification on directions for pt prednisone. Insurance will not take take as directed. I advised her per last OV note with CDY:  Patient Instructions     Increase prednisone when needed up to 60 mg for 2-3 days, then gradually taper back down over a week or so to 10 mg daily, or the lowest dose that holds you.    Raynelle Fanning will fix this for pt. Nothing further needed

## 2013-09-06 NOTE — Telephone Encounter (Signed)
Called, spoke with pt.  Reports 100 tabs of prednisone was supposed to be sent in yesterday, but she was only given 30 tabs by [facility].   Advised, per our records, we sent in rx for 100 tabs.  Pt requesting I call [facility]. Called [facility], spoke with Arville Go.  Was advised they did receive prednisone rx for 100 tabs and pt was given only 30 tabs.  Per Arville Go, they will fix this to ensure pt does receive a total of 100 tabs.  Reports they will call pt to fix this.   Called pt back.  Explained above to her.  She verbalized understanding, is aware [facility] will be calling her to fix this, and voiced no further questions or concerns at this time.

## 2013-09-25 ENCOUNTER — Encounter: Payer: Self-pay | Admitting: Internal Medicine

## 2013-09-25 ENCOUNTER — Telehealth: Payer: Self-pay | Admitting: Internal Medicine

## 2013-09-25 ENCOUNTER — Other Ambulatory Visit: Payer: Self-pay | Admitting: Internal Medicine

## 2013-09-25 MED ORDER — CEFDINIR 300 MG PO CAPS: 300.0000 mg | ORAL_CAPSULE | Freq: Two times a day (BID) | ORAL | Status: DC

## 2013-09-25 NOTE — Telephone Encounter (Signed)
Pt aware of recs. RX sent. Nothing further needed 

## 2013-09-25 NOTE — Telephone Encounter (Signed)
I called and spoke with pt. She reports she has been coughing up lumps of yellow mucus x 2-3 days. She is coughing non stop. She also is having runny nose, wheezing and chest tightness. Had trouble breathing this AM. She finally coughed up a chunk of phlem and breathing is okay now. She currently takes pred 10 mg 5 tabs dail;y x 2 days now. Pt has pending appt wed with Dr. Annamaria Boots. Please advise thanks  Allergies  Allergen Reactions  . Amoxicillin-Pot Clavulanate     GI upset  . Daliresp [Roflumilast] Other (See Comments)    unknown  . Diltiazem Nausea Only  . Montelukast Sodium     REACTION: flu-like symptoms  . Zafirlukast Other (See Comments)    unknown     Current Outpatient Prescriptions on File Prior to Visit  Medication Sig Dispense Refill  . albuterol (PROAIR HFA) 108 (90 BASE) MCG/ACT inhaler Inhale 2 puffs into the lungs every 6 (six) hours as needed for wheezing or shortness of breath. For shortness of breath  1 Inhaler  prn  . ALPRAZolam (XANAX) 0.25 MG tablet Take 0.25 mg by mouth daily.      . budesonide (PULMICORT) 0.25 MG/2ML nebulizer solution Take 0.25 mg by nebulization 2 (two) times daily.      . formoterol (PERFOROMIST) 20 MCG/2ML nebulizer solution Take 2 mLs (20 mcg total) by nebulization 2 (two) times daily.  120 mL  prn  . guaiFENesin (MUCINEX) 600 MG 12 hr tablet Take 600 mg by mouth daily.      . hydrochlorothiazide 25 MG tablet Take 25 mg by mouth daily.        Marland Kitchen ipratropium (ATROVENT) 0.02 % nebulizer solution Take 2.5 mLs (0.5 mg total) by nebulization 3 (three) times daily.  75 mL  6  . levalbuterol (XOPENEX) 0.63 MG/3ML nebulizer solution Take 1 ampule by nebulization every 6 (six) hours as needed for wheezing.      Marland Kitchen loratadine (CLARITIN) 10 MG tablet Take 10 mg by mouth daily.      Marland Kitchen losartan (COZAAR) 25 MG tablet Take 1 tablet (25 mg total) by mouth daily.  30 tablet  11  . omeprazole (PRILOSEC) 20 MG capsule Take 1 tablet by mouth Twice daily.      .  predniSONE (DELTASONE) 10 MG tablet Take 1 daily or as directed  100 tablet  1  . simvastatin (ZOCOR) 20 MG tablet Take 20 mg by mouth every evening.      . [DISCONTINUED] metoprolol tartrate (LOPRESSOR) 25 MG tablet Take 1 tablet (25 mg total) by mouth 2 (two) times daily.  30 tablet  0  . [DISCONTINUED] pantoprazole (PROTONIX) 40 MG tablet Take 1 tablet (40 mg total) by mouth daily at 12 noon.  10 tablet  0   No current facility-administered medications on file prior to visit.

## 2013-09-25 NOTE — Telephone Encounter (Signed)
Offer cefdinir 300 mg, # 14, 1 twice daily 

## 2013-09-25 NOTE — Assessment & Plan Note (Signed)
He acute exacerbation. Plan-refill prednisone, to taper from 60 mg as tolerated. Xopenex neb, Perforomist for neb w/ ipratropium.

## 2013-09-26 ENCOUNTER — Telehealth: Payer: Self-pay | Admitting: Internal Medicine

## 2013-09-26 MED ORDER — IPRATROPIUM BROMIDE 0.02 % IN SOLN: RESPIRATORY_TRACT | Status: DC

## 2013-09-26 NOTE — Telephone Encounter (Signed)
RX resent with Dx code as requested.

## 2013-09-27 ENCOUNTER — Ambulatory Visit (INDEPENDENT_AMBULATORY_CARE_PROVIDER_SITE_OTHER): Payer: Medicare Other | Admitting: Internal Medicine

## 2013-09-27 ENCOUNTER — Encounter: Payer: Self-pay | Admitting: Internal Medicine

## 2013-09-27 VITALS — BP 116/72 | HR 112 | Temp 97.7°F | Ht 62.25 in | Wt 143.0 lb

## 2013-09-27 DIAGNOSIS — J0100 Acute maxillary sinusitis, unspecified: Secondary | ICD-10-CM

## 2013-09-27 DIAGNOSIS — J441 Chronic obstructive pulmonary disease with (acute) exacerbation: Secondary | ICD-10-CM | POA: Diagnosis not present
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
## 2013-09-27 NOTE — Patient Instructions (Signed)
Finish the cefdinir antibiotic  Continue the regular asthma/ breathing meds  Consider trying the nasal saline rinse- either Neti pot or squeeze bottle, once or twice daily while you have the sinus infection

## 2013-10-16 ENCOUNTER — Telehealth: Payer: Self-pay | Admitting: Internal Medicine

## 2013-10-16 MED ORDER — CEFDINIR 300 MG PO CAPS: 300.0000 mg | ORAL_CAPSULE | Freq: Two times a day (BID) | ORAL | Status: DC

## 2013-10-16 NOTE — Telephone Encounter (Signed)
Called spoke with patient who reports that head congestion, PND, yellow nasal discharge, body aches, right ear pops, wheezing, chest tightness, increased SOB, dry cough x10days 40mg  daily x2 days at onset and tapered down to her regular 10mg  daily  [facility] [place] Allergies  Allergen Reactions  . Amoxicillin-Pot Clavulanate     GI upset  . Daliresp [Roflumilast] Other (See Comments)    unknown  . Diltiazem Nausea Only  . Montelukast Sodium     REACTION: flu-like symptoms  . Zafirlukast Other (See Comments)    unknown   Last ov w/ CDY 1.7.15 Dr Annamaria Boots please advise, thank you.

## 2013-10-16 NOTE — Telephone Encounter (Signed)
Pt is aware of CY's recs. She doesn't need a refill on prednisone at this time. Abx has been sent in. Nothing further was needed.

## 2013-10-16 NOTE — Telephone Encounter (Signed)
Suggest prednisone 20 mg daily for now, mucinex DM,  Script for prednisone 10 mg, # 50 if she needs more for now. Suggest script antibiotic cefdninr 300 mg, # 14, 1 twice daily

## 2013-10-21 DIAGNOSIS — J0100 Acute maxillary sinusitis, unspecified: Secondary | ICD-10-CM | POA: Insufficient documentation

## 2013-10-21 NOTE — Assessment & Plan Note (Signed)
Steroid dependent. Current episode is probably viral initially. Plan-follow through with prednisone taper. We will consider antibiotic coverage if cefdinir insufficient.

## 2013-10-21 NOTE — Assessment & Plan Note (Signed)
Plan-saline rinse, finish cefdinir

## 2013-11-02 DIAGNOSIS — J449 Chronic obstructive pulmonary disease, unspecified: Secondary | ICD-10-CM | POA: Diagnosis not present

## 2013-11-02 DIAGNOSIS — I10 Essential (primary) hypertension: Secondary | ICD-10-CM | POA: Diagnosis not present

## 2013-11-02 DIAGNOSIS — E785 Hyperlipidemia, unspecified: Secondary | ICD-10-CM | POA: Diagnosis not present

## 2013-11-04 ENCOUNTER — Other Ambulatory Visit: Payer: Self-pay | Admitting: Internal Medicine

## 2013-11-13 ENCOUNTER — Ambulatory Visit: Payer: Medicare Other | Admitting: Cardiovascular Disease

## 2013-11-13 DIAGNOSIS — I10 Essential (primary) hypertension: Secondary | ICD-10-CM | POA: Diagnosis not present

## 2013-11-13 DIAGNOSIS — Z6829 Body mass index (BMI) 29.0-29.9, adult: Secondary | ICD-10-CM | POA: Diagnosis not present

## 2013-11-13 DIAGNOSIS — IMO0002 Reserved for concepts with insufficient information to code with codable children: Secondary | ICD-10-CM | POA: Diagnosis not present

## 2013-11-13 DIAGNOSIS — M545 Low back pain, unspecified: Secondary | ICD-10-CM | POA: Diagnosis not present

## 2013-11-16 ENCOUNTER — Other Ambulatory Visit: Payer: Self-pay | Admitting: Neurosurgery

## 2013-11-16 DIAGNOSIS — M5416 Radiculopathy, lumbar region: Secondary | ICD-10-CM

## 2013-11-20 DIAGNOSIS — M545 Low back pain, unspecified: Secondary | ICD-10-CM | POA: Diagnosis not present

## 2013-11-20 DIAGNOSIS — N390 Urinary tract infection, site not specified: Secondary | ICD-10-CM | POA: Diagnosis not present

## 2013-11-22 ENCOUNTER — Encounter (INDEPENDENT_AMBULATORY_CARE_PROVIDER_SITE_OTHER): Payer: Self-pay

## 2013-11-22 ENCOUNTER — Telehealth: Payer: Self-pay | Admitting: Cardiovascular Disease

## 2013-11-22 ENCOUNTER — Ambulatory Visit (INDEPENDENT_AMBULATORY_CARE_PROVIDER_SITE_OTHER): Payer: Medicare Other | Admitting: Cardiovascular Disease

## 2013-11-22 ENCOUNTER — Encounter: Payer: Self-pay | Admitting: Cardiovascular Disease

## 2013-11-22 ENCOUNTER — Other Ambulatory Visit: Payer: Self-pay | Admitting: *Deleted

## 2013-11-22 VITALS — BP 144/82 | HR 99 | Ht 62.25 in | Wt 146.8 lb

## 2013-11-22 DIAGNOSIS — J449 Chronic obstructive pulmonary disease, unspecified: Secondary | ICD-10-CM

## 2013-11-22 DIAGNOSIS — I359 Nonrheumatic aortic valve disorder, unspecified: Secondary | ICD-10-CM

## 2013-11-22 DIAGNOSIS — I10 Essential (primary) hypertension: Secondary | ICD-10-CM | POA: Diagnosis not present

## 2013-11-22 DIAGNOSIS — I480 Paroxysmal atrial fibrillation: Secondary | ICD-10-CM

## 2013-11-22 DIAGNOSIS — I4891 Unspecified atrial fibrillation: Secondary | ICD-10-CM

## 2013-11-22 DIAGNOSIS — E785 Hyperlipidemia, unspecified: Secondary | ICD-10-CM

## 2013-11-22 NOTE — Assessment & Plan Note (Signed)
Continue prednisone and oxygen Would need to be cleared by Dr Annamaria Boots for any general anesthesia

## 2013-11-22 NOTE — Progress Notes (Signed)
Patient ID: [name], female   DOB: Jun 26, 1933, 80 y.o.   MRN: [id number] 80 yo with sig. COPD sees Dr Annamaria Boots. History of mild AS  and PAF. Having issues with asthma and allergies. Dyspnea chronic from lung disease. No chest pain or syncope.   Echo 8/14  Study Conclusions  - Left ventricle: The cavity size was moderately dilated. Wall thickness was normal. Systolic function was severely reduced. The estimated ejection fraction was in the range of 25% to 30%. Diffuse hypokinesis. - Aortic valve: Moderately calcified with likely moderate AS given degree of LV dysfunction - Mitral valve: Mild regurgitation. - Left atrium: The atrium was mildly dilated. - Atrial septum: No defect or patent foramen ovale was identified.  Venous duplex 8/14 no DVT  Cath 07/13/13 by Dr Burt Knack no significant AS and no CAD  Left mainstem: No stenosis, arises from left cusp.  Left anterior descending (LAD): Patent to the distal anterior wall. No significant stenosis throughout  Left circumflex (LCx): Widely patent. OM2 and OM3 are the major branches and they have no stenosis.  Right coronary artery (RCA): normal caliber vessel with mild 20-30% mid-vessel stenosis. PDA is large without significant disease.  Left ventriculography: Left ventricular systolic function is moderately depressed (global). The LVEF is estimated at 35-40%.  Final Conclusions:  1. Minimal nonobstructive CAD  2. No significant aortic stenosis (no transvalvular gradient with simultaneous pressures)  3. Moderately severe global LV dysfunction  Recommendations: med Rx for cardiomyopathy.   Right heart pressures ok with no pulmonary hypertension  Hemodynamics  RA 9  RV 36/9  PA 40/14 mean 27  PCWP 17  LV 122/11  AO 122/74 mean 89  Oxygen saturations:  PA 63  AO 91  Cardiac Output (Fick) 4.4  Cardiac Index (Fick) 2.63   She is having back problems again Seeing Cabbell  She has history of ruptured disc.  Not taking her diuretic ran  out.   Has oxygen dependant COPD Sees [facility]  Not a great candidate for general anesthesia base on this     ROS: Denies fever, malais, weight loss, blurry vision, decreased visual acuity, cough, sputum, SOB, hemoptysis, pleuritic pain, palpitaitons, heartburn, abdominal pain, melena, lower extremity edema, claudication, or rash.  All other systems reviewed and negative  General: Affect appropriate Chronically ill female  HEENT: normal Neck supple with no adenopathy JVP normal no bruits no thyromegaly Lungs no wheezing and good diaphragmatic motion Heart:  S1/S2 no murmur, no rub, gallop or click PMI normal Abdomen: benighn, BS positve, no tenderness, no AAA no bruit.  No HSM or HJR Distal pulses intact with no bruits Plus one  edema Neuro non-focal Skin warm and dry No muscular weakness   Current Outpatient Prescriptions  Medication Sig Dispense Refill  . albuterol (PROAIR HFA) 108 (90 BASE) MCG/ACT inhaler Inhale 2 puffs into the lungs every 6 (six) hours as needed for wheezing or shortness of breath. For shortness of breath  1 Inhaler  prn  . ALPRAZolam (XANAX) 0.25 MG tablet Take 0.25 mg by mouth daily.      . budesonide (PULMICORT) 0.25 MG/2ML nebulizer solution Take 0.25 mg by nebulization 2 (two) times daily.      . cefdinir (OMNICEF) 300 MG capsule Take 1 capsule (300 mg total) by mouth 2 (two) times daily.  14 capsule  0  . cephALEXin (KEFLEX) 500 MG capsule Take 500 mg by mouth 2 (two) times daily.      . formoterol (PERFOROMIST) 20 MCG/2ML nebulizer  solution Take 2 mLs (20 mcg total) by nebulization 2 (two) times daily.  120 mL  prn  . guaiFENesin (MUCINEX) 600 MG 12 hr tablet Take 600 mg by mouth daily.      . hydrochlorothiazide 25 MG tablet Take 25 mg by mouth daily.        Marland Kitchen ipratropium (ATROVENT) 0.02 % nebulizer solution USE 1 VIAL IN NEBULIZER 3 TIMES A DAY  DX 493.20  187.5 mL  6  . levalbuterol (XOPENEX) 0.63 MG/3ML nebulizer solution Take 1 ampule by  nebulization every 6 (six) hours as needed for wheezing.      Marland Kitchen loratadine (CLARITIN) 10 MG tablet Take 10 mg by mouth daily.      Marland Kitchen losartan (COZAAR) 25 MG tablet Take 1 tablet (25 mg total) by mouth daily.  30 tablet  11  . omeprazole (PRILOSEC) 20 MG capsule Take 1 tablet by mouth Twice daily.      Marland Kitchen oxyCODONE-acetaminophen (PERCOCET) 7.5-325 MG per tablet Take 1 tablet by mouth every 4 (four) hours as needed for pain.      . predniSONE (DELTASONE) 10 MG tablet TAKE 1 TABLET BY MOUTH AS DIRECTED  100 tablet  1  . simvastatin (ZOCOR) 20 MG tablet Take 20 mg by mouth every evening.      . [DISCONTINUED] metoprolol tartrate (LOPRESSOR) 25 MG tablet Take 1 tablet (25 mg total) by mouth 2 (two) times daily.  30 tablet  0  . [DISCONTINUED] pantoprazole (PROTONIX) 40 MG tablet Take 1 tablet (40 mg total) by mouth daily at 12 noon.  10 tablet  0   No current facility-administered medications for this visit.    Allergies  Amoxicillin-pot clavulanate; Daliresp; Diltiazem; Montelukast sodium; and Zafirlukast  Electrocardiogram:  07/15/14  SR PAC normal STlT waves   Assessment and Plan

## 2013-11-22 NOTE — Assessment & Plan Note (Signed)
Maint NSR  HR a bit high Would like to be on beta blocker but not wise with lung disease

## 2013-11-22 NOTE — Telephone Encounter (Signed)
PER  PT HAS  BEEN TAKING  HCTZ 25 MG  EVERY DAY  DISCUSSED WITH  DR  Johnsie Cancel CONT  WITH SAME  MEDS   AND WILL CHECK  BMET  AND BNP IN 4  WEEKS AS PREVIOUSLY  SCHEDULED AND  BASED ON THOSE  RESULTS  AND IF  CONT TO HAVE EDEMA MAY  CHANGE  DIURETIC .PT  VERBALIZED   UNDERSTANDING .Adonis Housekeeper

## 2013-11-22 NOTE — Assessment & Plan Note (Signed)
No change in murmur No gradient by cath Mild

## 2013-11-22 NOTE — Telephone Encounter (Signed)
New message     Pt saw Dr Johnsie Cancel this am---she is supposed to call the nurse back regarding medications

## 2013-11-22 NOTE — Assessment & Plan Note (Signed)
Cholesterol is at goal.  Continue current dose of statin and diet Rx.  No myalgias or side effects.  F/U  LFT's in 6 months. Lab Results  Component Value Date   [facility]  Value: 127        Total Cholesterol/HDL:CHD Risk Coronary Heart Disease Risk Table                     Men   Women  1/2 Average Risk   3.4   3.3* 07/30/2007

## 2013-11-22 NOTE — Patient Instructions (Signed)
Your physician wants you to follow-up in:   [place] will receive a reminder letter in the mail two months in advance. If you don't receive a letter, please call our office to schedule the follow-up appointment. Your physician recommends that you continue on your current medications as directed. Please refer to the Current Medication list given to you today. Your physician recommends that you return for lab work in: [place] April  BMET BNP

## 2013-11-22 NOTE — Assessment & Plan Note (Signed)
Add back diuretic  Should improve

## 2013-11-28 ENCOUNTER — Ambulatory Visit
Admission: RE | Admit: 2013-11-28 | Discharge: 2013-11-28 | Disposition: A | Discharge status: Home / Self Care | Payer: PRIVATE HEALTH INSURANCE | Source: Ambulatory Visit | Attending: Neurosurgery | Admitting: Neurosurgery

## 2013-11-28 DIAGNOSIS — M5126 Other intervertebral disc displacement, lumbar region: Secondary | ICD-10-CM | POA: Diagnosis not present

## 2013-11-28 DIAGNOSIS — M5137 Other intervertebral disc degeneration, lumbosacral region: Secondary | ICD-10-CM | POA: Diagnosis not present

## 2013-11-28 DIAGNOSIS — M48061 Spinal stenosis, lumbar region without neurogenic claudication: Secondary | ICD-10-CM | POA: Diagnosis not present

## 2013-11-28 DIAGNOSIS — M5416 Radiculopathy, lumbar region: Secondary | ICD-10-CM

## 2013-11-28 MED ORDER — GADOBENATE DIMEGLUMINE 529 MG/ML IV SOLN
13.0000 mL | Freq: Once | INTRAVENOUS | Status: AC | PRN
  Administered 2013-11-28: 13 mL via INTRAVENOUS

## 2013-11-30 DIAGNOSIS — I10 Essential (primary) hypertension: Secondary | ICD-10-CM | POA: Diagnosis not present

## 2013-11-30 DIAGNOSIS — Z6826 Body mass index (BMI) 26.0-26.9, adult: Secondary | ICD-10-CM | POA: Diagnosis not present

## 2013-11-30 DIAGNOSIS — M5126 Other intervertebral disc displacement, lumbar region: Secondary | ICD-10-CM | POA: Diagnosis not present

## 2013-11-30 DIAGNOSIS — IMO0002 Reserved for concepts with insufficient information to code with codable children: Secondary | ICD-10-CM | POA: Diagnosis not present

## 2013-12-07 ENCOUNTER — Telehealth: Payer: Self-pay | Admitting: Internal Medicine

## 2013-12-07 ENCOUNTER — Other Ambulatory Visit: Payer: Self-pay | Admitting: Neurosurgery

## 2013-12-07 NOTE — Telephone Encounter (Signed)
Spoke with Helene Kelp at [facility]- she took phone note that I have faxed clearance letter from [place] to her on 12-04-13 and again today 12-07-13. If she has any questions or concerns to please call the office.    Pt is aware that I have re-faxed her clearance information as well as a copy was sent out on 12-04-13 to her home address.

## 2013-12-08 NOTE — Addendum Note (Signed)
Addended byAshok Pall on: 12/08/2013 05:46 PM   Modules accepted: Orders

## 2013-12-12 ENCOUNTER — Encounter (HOSPITAL_COMMUNITY): Payer: Self-pay | Admitting: Pharmacy Technician

## 2013-12-13 ENCOUNTER — Encounter (HOSPITAL_COMMUNITY)
Admission: RE | Admit: 2013-12-13 | Discharge: 2013-12-13 | Disposition: A | Discharge status: Home / Self Care | Payer: Medicare Other | Source: Ambulatory Visit | Attending: Pulmonary Disease | Admitting: Pulmonary Disease

## 2013-12-13 ENCOUNTER — Encounter (HOSPITAL_COMMUNITY): Payer: Self-pay

## 2013-12-13 DIAGNOSIS — Z01812 Encounter for preprocedural laboratory examination: Secondary | ICD-10-CM | POA: Diagnosis not present

## 2013-12-13 DIAGNOSIS — M5126 Other intervertebral disc displacement, lumbar region: Secondary | ICD-10-CM | POA: Diagnosis not present

## 2013-12-13 DIAGNOSIS — G35 Multiple sclerosis: Secondary | ICD-10-CM | POA: Diagnosis not present

## 2013-12-13 DIAGNOSIS — K219 Gastro-esophageal reflux disease without esophagitis: Secondary | ICD-10-CM | POA: Diagnosis not present

## 2013-12-13 DIAGNOSIS — J438 Other emphysema: Secondary | ICD-10-CM | POA: Diagnosis not present

## 2013-12-13 DIAGNOSIS — J45909 Unspecified asthma, uncomplicated: Secondary | ICD-10-CM | POA: Diagnosis not present

## 2013-12-13 DIAGNOSIS — Z87891 Personal history of nicotine dependence: Secondary | ICD-10-CM | POA: Diagnosis not present

## 2013-12-13 DIAGNOSIS — I739 Peripheral vascular disease, unspecified: Secondary | ICD-10-CM | POA: Diagnosis not present

## 2013-12-13 DIAGNOSIS — I10 Essential (primary) hypertension: Secondary | ICD-10-CM | POA: Diagnosis not present

## 2013-12-13 DIAGNOSIS — I509 Heart failure, unspecified: Secondary | ICD-10-CM | POA: Diagnosis not present

## 2013-12-13 DIAGNOSIS — R011 Cardiac murmur, unspecified: Secondary | ICD-10-CM | POA: Diagnosis not present

## 2013-12-13 DIAGNOSIS — I4891 Unspecified atrial fibrillation: Secondary | ICD-10-CM | POA: Diagnosis not present

## 2013-12-13 HISTORY — DX: Dorsalgia, unspecified: M54.9

## 2013-12-13 HISTORY — DX: Spontaneous ecchymoses: R23.3

## 2013-12-13 HISTORY — DX: Localized edema: R60.0

## 2013-12-13 HISTORY — DX: Other skin changes: R23.8

## 2013-12-13 HISTORY — DX: Edema, unspecified: R60.9

## 2013-12-13 HISTORY — DX: Personal history of other diseases of the respiratory system: Z87.09

## 2013-12-13 HISTORY — DX: Hyperlipidemia, unspecified: E78.5

## 2013-12-13 HISTORY — DX: Pain in unspecified joint: M25.50

## 2013-12-13 HISTORY — DX: Personal history of colonic polyps: Z86.010

## 2013-12-13 HISTORY — DX: Personal history of other diseases of the musculoskeletal system and connective tissue: Z87.39

## 2013-12-13 HISTORY — DX: Other chronic pain: G89.29

## 2013-12-13 HISTORY — DX: Anxiety disorder, unspecified: F41.9

## 2013-12-13 HISTORY — DX: Shortness of breath: R06.02

## 2013-12-13 HISTORY — DX: Other specified postprocedural states: R11.2

## 2013-12-13 HISTORY — DX: Other specified postprocedural states: Z98.890

## 2013-12-13 HISTORY — DX: Personal history of other infectious and parasitic diseases: Z86.19

## 2013-12-13 HISTORY — DX: Unspecified cataract: H26.9

## 2013-12-13 HISTORY — DX: Pneumonia, unspecified organism: J18.9

## 2013-12-13 HISTORY — DX: Unspecified hemorrhoids: K64.9

## 2013-12-13 HISTORY — DX: Effusion, unspecified joint: M25.40

## 2013-12-13 LAB — CBC
HEMATOCRIT: 40.4 % (ref 36.0–46.0)
Hemoglobin: 13.5 g/dL (ref 12.0–15.0)
MCH: 30.3 pg (ref 26.0–34.0)
MCHC: 33.4 g/dL (ref 30.0–36.0)
MCV: 90.6 fL (ref 78.0–100.0)
PLATELETS: 226 10*3/uL (ref 150–400)
RBC: 4.46 MIL/uL (ref 3.87–5.11)
RDW: 14.2 % (ref 11.5–15.5)
WBC: 11.5 10*3/uL — ABNORMAL HIGH (ref 4.0–10.5)

## 2013-12-13 LAB — BASIC METABOLIC PANEL
BUN: 20 mg/dL (ref 6–23)
CALCIUM: 9.2 mg/dL (ref 8.4–10.5)
CO2: 31 mEq/L (ref 19–32)
CREATININE: 0.65 mg/dL (ref 0.50–1.10)
Chloride: 97 mEq/L (ref 96–112)
GFR, EST NON AFRICAN AMERICAN: 83 mL/min — AB (ref >90)
Glucose, Bld: 103 mg/dL — ABNORMAL HIGH (ref 70–99)
Potassium: 4 mEq/L (ref 3.7–5.3)
Sodium: 140 mEq/L (ref 137–147)

## 2013-12-13 LAB — SURGICAL PCR SCREEN
MRSA, PCR: POSITIVE — AB
Staphylococcus aureus: POSITIVE — AB

## 2013-12-13 NOTE — Pre-Procedure Instructions (Signed)
[name]  12/13/2013   Your procedure is scheduled on:  Fri, Mar 27 @ 7:30 AM  Report to [facility]  at 5:30 AM.  Call this number if you have problems the morning of surgery: [phone]   Remember:   Do not eat food or drink liquids after midnight.   Take these medicines the morning of surgery with A SIP OF WATER: Albuterol<Bring Your Inhaler With You>,Alprazolam(Xanax),Pulmicort-Neb(Budesonide),Mucinex(Guaifenesin),Atrovent(Ipratropium),Formoterol(Perforomist Neb),Xopenex(levaltuerol),Omeprazole( Prilsoec),Pain Pill(if needed),and Prednisone(Deltasone)              No Goody's,BC's,Aleve,Aspirin,Ibuprofen,Fish Oil,or any Herbal Medications    Do not wear jewelry, make-up or nail polish.  Do not wear lotions, powders, or perfumes. You may wear deodorant.  Do not shave 48 hours prior to surgery.   Do not bring valuables to the hospital.  [facility] is not responsible                  for any belongings or valuables.               Contacts, dentures or bridgework may not be worn into surgery.  Leave suitcase in the car. After surgery it may be brought to your room.  For patients admitted to the hospital, discharge time is determined by your                treatment team.               Patients discharged the day of surgery will not be allowed to drive  home.    Special Instructions:  [place] - Preparing for Surgery  Before surgery, you can play an important role.  Because skin is not sterile, your skin needs to be as free of germs as possible.  You can reduce the number of germs on you skin by washing with CHG (chlorahexidine gluconate) soap before surgery.  CHG is an antiseptic cleaner which kills germs and bonds with the skin to continue killing germs even after washing.  Please DO NOT use if you have an allergy to CHG or antibacterial soaps.  If your skin becomes reddened/irritated stop using the CHG and inform your nurse when you arrive at Short Stay.  Do not  shave (including legs and underarms) for at least 48 hours prior to the first CHG shower.  You may shave your face.  Please follow these instructions carefully:   1.  Shower with CHG Soap the night before surgery and the                                morning of Surgery.  2.  If you choose to wash your hair, wash your hair first as usual with your       normal shampoo.  3.  After you shampoo, rinse your hair and body thoroughly to remove the                      Shampoo.  4.  Use CHG as you would any other liquid soap.  You can apply chg directly       to the skin and wash gently with scrungie or a clean washcloth.  5.  Apply the CHG Soap to your body ONLY FROM THE NECK DOWN.        Do not use on open wounds or open sores.  Avoid contact with your eyes,  ears, mouth and genitals (private parts).  Wash genitals (private parts)       with your normal soap.  6.  Wash thoroughly, paying special attention to the area where your surgery        will be performed.  7.  Thoroughly rinse your body with warm water from the neck down.  8.  DO NOT shower/wash with your normal soap after using and rinsing off       the CHG Soap.  9.  Pat yourself dry with a clean towel.            10.  Wear clean pajamas.            11.  Place clean sheets on your bed the night of your first shower and do not        sleep with pets.  Day of Surgery  Do not apply any lotions/deoderants the morning of surgery.  Please wear clean clothes to the hospital/surgery center.     Please read over the following fact sheets that you were given: Pain Booklet, Coughing and Deep Breathing, MRSA Information and Surgical Site Infection Prevention

## 2013-12-13 NOTE — Progress Notes (Signed)
Anesthesia Note:  Patient is a 80 year old female scheduled for right L2-3 microdiskectomy on 12/15/13 by Dr. Christella Noa.  History includes former smoker, HTN, likely moderate AS by 04/2013 echo but no significant AS by 06/2013 cath, paroxysmal atrial fibrillation ~ 5 or more years ago, hypertension, arthritis (says she was never definitively diagnosed with RA), COPD on home 3L/[place] O2 at night (and PRN exacerbation) and chronic steroids, asthma, multiple sclerosis (had temporary left eye blindness around age 73, but no issues since--she is no longer followed by a neurologist), GERD, depression, arthritis, hysterectomy, lumbar disc surgery, breast cyst excision. PCP is listed as Dr. Lillard Anes. Cardiologist is Dr. Johnsie Cancel, last visit 11/22/13 who was aware that she was seeing Dr. Arneta Cliche for her back issues.  His note mentions needing pulmonary clearance for general anesthesia.  Dr. Baird Lyons was notified of plans for surgery, and he wrote, "Last seen for pulmonary 09/27/12. Latroya has severe COPD with significant labile asthma on maintenance prednisone with frequent bursts/tap complicated by aortic stenosis, paroxysmal A. fib. She is currently stable for necessary surgery but he quick to request consultation for problems."  Meds: Xanax, albuterol, Pulmicort, Perforomist, Mucinex, HCTZ, Atrovent, Xopenix, losartan, Prilosec, Percocet, prednisone, Zocor.  Cardiac cath on Cath 07/13/13 by Dr Burt Knack no significant AS and no CAD. Right heart pressures ok with no pulmonary hypertension.   Hemodynamics  RA 9  RV 36/9  PA 40/14 mean 27  PCWP 17  LV 122/11  AO 122/74 mean 89  Oxygen saturations:  PA 63  AO 91  Cardiac Output (Fick) 4.4  Cardiac Index (Fick) 2.63   Left mainstem: No stenosis, arises from left cusp.  Left anterior descending (LAD): Patent to the distal anterior wall. No significant stenosis throughout. Left circumflex (LCx): Widely patent. OM2 and OM3 are the major branches and they  have no stenosis.  Right coronary artery (RCA): normal caliber vessel with mild 20-30% mid-vessel stenosis. PDA is large without significant disease.  Left ventriculography: Left ventricular systolic function is moderately depressed (global). The LVEF is estimated at 35-40%.  Final Conclusions:  1. Minimal nonobstructive CAD  2. No significant aortic stenosis (no transvalvular gradient with simultaneous pressures)  3. Moderately severe global LV dysfunction  Recommendations: med Rx for cardiomyopathy.  Echo on 05/16/13 showed: - Left ventricle: The cavity size was moderately dilated. Wall thickness was normal. Systolic function was severely reduced. The estimated ejection fraction was in the range of 25% to 30%. Diffuse hypokinesis. - Aortic valve: Moderately calcified with likely moderate AS given degree of LV dysfunction. VTI ratio of LVOT to aortic valve: 0.32. Valve area: 1.11cm^2(VTI). Indexed valve area: 0.68cm^2/m^2 (VTI). Peak velocity ratio of LVOT to aortic valve: 0.33. Valve area: 1.15cm^2 (Vmax). Indexed valve area: 0.7cm^2/m^2 (Vmax). Mean gradient: 28mm Hg (S). Peak gradient: 38mm Hg (S). - Mitral valve: Mild regurgitation. - Left atrium: The atrium was mildly dilated. - Atrial septum: No defect or patent foramen ovale was identified. - Pulmonic valve: Mild regurgitation. - Tricuspid valve: Mild regurgitation.  EKG on 07/13/13 showed SR with frequent PVCs, non-specific ST abnormality.  CXR on 07/10/13 showed no active cardiopulmonary disease.  Preoperative labs noted.  Overall, she feels well from a cardiopulmonary standpoint. Her allergies have been acting up over the past 1-2 days with sneezing, slight runny nose, but does not feel sick.  No fever.  No new wheezing.  She has known issues with allergies that she is able to manage at home.  Symptoms do not typically  progress.  She is not on antihistamine medication.  Her last COPD exacerbation with additioanl prednisone taper was  10/16/13.  Her breathing does not typically limit her usual daily activities.  She is able to sleep in bed with use of two pillows.  On exam her heart sounds are distant but regular.  She is mildly tachycardic around 110 bpm.  Lungs are clear without wheezing or rhonchi.  No labored breathing or conversational dyspnea noted. She appears well.  No LE edema noted. RR 20, RA O2 sat 94%.  She knows to take her prednisone and COPD regimen on the morning of surgery.  She reports some mild allergy symptoms which are not particularly bothersome to her.  Her lungs are clear.  She knows to let Dr. Christella Noa and/or Dr. Annamaria Boots know if there are any changes as we want her at baseline for surgery.  I think if no acute changes then she could proceed as planned.  I updated anesthesiologist Dr. Marcie Bal as well regarding her history.  George Hugh [facility]/Anesthesiology Phone [phone] 12/13/2013 5:23 PM

## 2013-12-13 NOTE — Progress Notes (Signed)
Mupirocin called into [facility] in [place]

## 2013-12-13 NOTE — Progress Notes (Addendum)
Dr.Nishan is cardiologist with last visit about a month ago   Multiple echo reports in epic with most recent one being 2014  Heart cath report in epic from 2014  EKG in epic from 07-13-13  CXR in epic from 07-12-13  Note in chart from [place] test done more than 57yrs ago  Pulmonologist is Dr.Clint [employer] [facility] is Dr. Reinaldo Meeker

## 2013-12-14 MED ORDER — VANCOMYCIN HCL IN DEXTROSE 1-5 GM/200ML-% IV SOLN
1000.0000 mg | INTRAVENOUS | Status: AC
  Administered 2013-12-15: 1000 mg via INTRAVENOUS
  Filled 2013-12-14: qty 200

## 2013-12-14 NOTE — Progress Notes (Signed)
Quick Note:  Called, spoke with pt. Informed her of labs per Dr. Annamaria Boots. She verbalized understanding. ______

## 2013-12-15 ENCOUNTER — Observation Stay (HOSPITAL_COMMUNITY)
Admission: RE | Admit: 2013-12-15 | Discharge: 2013-12-16 | Disposition: A | Discharge status: Home / Self Care | Payer: Medicare Other | Source: Ambulatory Visit | Attending: Neurosurgery | Admitting: Neurosurgery

## 2013-12-15 ENCOUNTER — Encounter (HOSPITAL_COMMUNITY): Payer: Medicare Other | Admitting: Vascular Surgery

## 2013-12-15 ENCOUNTER — Encounter (HOSPITAL_COMMUNITY): Payer: Self-pay | Admitting: *Deleted

## 2013-12-15 ENCOUNTER — Encounter (HOSPITAL_COMMUNITY)
Admission: RE | Disposition: A | Discharge status: Home / Self Care | Payer: Self-pay | Source: Ambulatory Visit | Attending: Neurosurgery

## 2013-12-15 ENCOUNTER — Ambulatory Visit (HOSPITAL_COMMUNITY): Payer: Medicare Other

## 2013-12-15 ENCOUNTER — Ambulatory Visit (HOSPITAL_COMMUNITY): Payer: Medicare Other | Admitting: Anesthesiology

## 2013-12-15 DIAGNOSIS — G35 Multiple sclerosis: Secondary | ICD-10-CM | POA: Insufficient documentation

## 2013-12-15 DIAGNOSIS — I4891 Unspecified atrial fibrillation: Secondary | ICD-10-CM | POA: Insufficient documentation

## 2013-12-15 DIAGNOSIS — I10 Essential (primary) hypertension: Secondary | ICD-10-CM | POA: Insufficient documentation

## 2013-12-15 DIAGNOSIS — I509 Heart failure, unspecified: Secondary | ICD-10-CM | POA: Insufficient documentation

## 2013-12-15 DIAGNOSIS — Z87891 Personal history of nicotine dependence: Secondary | ICD-10-CM | POA: Insufficient documentation

## 2013-12-15 DIAGNOSIS — I739 Peripheral vascular disease, unspecified: Secondary | ICD-10-CM | POA: Insufficient documentation

## 2013-12-15 DIAGNOSIS — M5126 Other intervertebral disc displacement, lumbar region: Secondary | ICD-10-CM | POA: Diagnosis not present

## 2013-12-15 DIAGNOSIS — Z01812 Encounter for preprocedural laboratory examination: Secondary | ICD-10-CM | POA: Diagnosis not present

## 2013-12-15 DIAGNOSIS — J438 Other emphysema: Secondary | ICD-10-CM | POA: Insufficient documentation

## 2013-12-15 DIAGNOSIS — J45909 Unspecified asthma, uncomplicated: Secondary | ICD-10-CM | POA: Insufficient documentation

## 2013-12-15 DIAGNOSIS — R011 Cardiac murmur, unspecified: Secondary | ICD-10-CM | POA: Insufficient documentation

## 2013-12-15 DIAGNOSIS — M539 Dorsopathy, unspecified: Secondary | ICD-10-CM | POA: Diagnosis not present

## 2013-12-15 DIAGNOSIS — K219 Gastro-esophageal reflux disease without esophagitis: Secondary | ICD-10-CM | POA: Insufficient documentation

## 2013-12-15 HISTORY — PX: LUMBAR LAMINECTOMY/DECOMPRESSION MICRODISCECTOMY: SHX5026

## 2013-12-15 SURGERY — LUMBAR LAMINECTOMY/DECOMPRESSION MICRODISCECTOMY 1 LEVEL
Anesthesia: General | Laterality: Right

## 2013-12-15 MED ORDER — HYDROMORPHONE HCL PF 1 MG/ML IJ SOLN
0.2500 mg | INTRAMUSCULAR | Status: DC | PRN
  Administered 2013-12-15: 0.5 mg via INTRAVENOUS

## 2013-12-15 MED ORDER — 0.9 % SODIUM CHLORIDE (POUR BTL) OPTIME
TOPICAL | Status: DC | PRN
  Administered 2013-12-15: 1000 mL

## 2013-12-15 MED ORDER — DEXAMETHASONE SODIUM PHOSPHATE 4 MG/ML IJ SOLN
INTRAMUSCULAR | Status: AC
  Filled 2013-12-15: qty 2

## 2013-12-15 MED ORDER — ALBUTEROL SULFATE (2.5 MG/3ML) 0.083% IN NEBU
2.5000 mg | INHALATION_SOLUTION | Freq: Two times a day (BID) | RESPIRATORY_TRACT | Status: DC | PRN
  Filled 2013-12-15 (×2): qty 3

## 2013-12-15 MED ORDER — HYDROCODONE-ACETAMINOPHEN 5-325 MG PO TABS
1.0000 | ORAL_TABLET | ORAL | Status: DC | PRN
  Administered 2013-12-15 – 2013-12-16 (×2): 1 via ORAL
  Filled 2013-12-15 (×2): qty 1

## 2013-12-15 MED ORDER — ALBUTEROL SULFATE (2.5 MG/3ML) 0.083% IN NEBU: 2.5000 mg | INHALATION_SOLUTION | Freq: Three times a day (TID) | RESPIRATORY_TRACT | Status: DC

## 2013-12-15 MED ORDER — ACETAMINOPHEN 325 MG PO TABS
650.0000 mg | ORAL_TABLET | ORAL | Status: DC | PRN
Start: 2013-12-15 — End: 2013-12-16

## 2013-12-15 MED ORDER — PROPOFOL 10 MG/ML IV BOLUS
INTRAVENOUS | Status: AC
  Filled 2013-12-15: qty 20

## 2013-12-15 MED ORDER — FENTANYL CITRATE 0.05 MG/ML IJ SOLN
INTRAMUSCULAR | Status: AC
  Filled 2013-12-15: qty 5

## 2013-12-15 MED ORDER — FENTANYL CITRATE 0.05 MG/ML IJ SOLN
INTRAMUSCULAR | Status: DC | PRN
  Administered 2013-12-15: 100 ug via INTRAVENOUS
  Administered 2013-12-15 (×4): 50 ug via INTRAVENOUS
  Administered 2013-12-15: 100 ug via INTRAVENOUS
  Administered 2013-12-15 (×2): 50 ug via INTRAVENOUS

## 2013-12-15 MED ORDER — THROMBIN 5000 UNITS EX SOLR
CUTANEOUS | Status: DC | PRN
  Administered 2013-12-15 (×2): 5000 [IU] via TOPICAL

## 2013-12-15 MED ORDER — MIDAZOLAM HCL 2 MG/2ML IJ SOLN
INTRAMUSCULAR | Status: AC
  Filled 2013-12-15: qty 2

## 2013-12-15 MED ORDER — ALBUTEROL SULFATE (5 MG/ML) 0.5% IN NEBU: 2.5000 mg | INHALATION_SOLUTION | Freq: Four times a day (QID) | RESPIRATORY_TRACT | Status: DC

## 2013-12-15 MED ORDER — LIDOCAINE HCL (CARDIAC) 20 MG/ML IV SOLN
INTRAVENOUS | Status: DC | PRN
  Administered 2013-12-15 (×2): 50 mg via INTRAVENOUS

## 2013-12-15 MED ORDER — LIDOCAINE HCL (CARDIAC) 20 MG/ML IV SOLN
INTRAVENOUS | Status: AC
  Filled 2013-12-15: qty 10

## 2013-12-15 MED ORDER — KETOROLAC TROMETHAMINE 30 MG/ML IJ SOLN
30.0000 mg | Freq: Four times a day (QID) | INTRAMUSCULAR | Status: DC
  Administered 2013-12-15: 30 mg via INTRAVENOUS
  Filled 2013-12-15: qty 1

## 2013-12-15 MED ORDER — POTASSIUM CHLORIDE IN NACL 20-0.9 MEQ/L-% IV SOLN
INTRAVENOUS | Status: DC
  Filled 2013-12-15 (×3): qty 1000

## 2013-12-15 MED ORDER — GLYCOPYRROLATE 0.2 MG/ML IJ SOLN
INTRAMUSCULAR | Status: DC | PRN
  Administered 2013-12-15: 0.6 mg via INTRAVENOUS

## 2013-12-15 MED ORDER — DEXAMETHASONE 4 MG PO TABS
4.0000 mg | ORAL_TABLET | Freq: Four times a day (QID) | ORAL | Status: AC
  Administered 2013-12-15 (×2): 4 mg via ORAL
  Filled 2013-12-15 (×2): qty 1

## 2013-12-15 MED ORDER — LIDOCAINE HCL (CARDIAC) 20 MG/ML IV SOLN: INTRAVENOUS | Status: DC | PRN

## 2013-12-15 MED ORDER — MUPIROCIN 2 % EX OINT
TOPICAL_OINTMENT | CUTANEOUS | Status: AC
  Filled 2013-12-15: qty 22

## 2013-12-15 MED ORDER — SUCCINYLCHOLINE CHLORIDE 20 MG/ML IJ SOLN
INTRAMUSCULAR | Status: AC
  Filled 2013-12-15: qty 2

## 2013-12-15 MED ORDER — ALBUTEROL SULFATE HFA 108 (90 BASE) MCG/ACT IN AERS: 2.0000 | INHALATION_SPRAY | Freq: Two times a day (BID) | RESPIRATORY_TRACT | Status: DC | PRN

## 2013-12-15 MED ORDER — SODIUM CHLORIDE 0.9 % IJ SOLN: 3.0000 mL | INTRAMUSCULAR | Status: DC | PRN

## 2013-12-15 MED ORDER — ARFORMOTEROL TARTRATE 15 MCG/2ML IN NEBU
15.0000 ug | INHALATION_SOLUTION | Freq: Two times a day (BID) | RESPIRATORY_TRACT | Status: DC
  Administered 2013-12-15 – 2013-12-16 (×2): 15 ug via RESPIRATORY_TRACT
  Filled 2013-12-15 (×4): qty 2

## 2013-12-15 MED ORDER — ONDANSETRON HCL 4 MG/2ML IJ SOLN
INTRAMUSCULAR | Status: DC | PRN
  Administered 2013-12-15 (×2): 4 mg via INTRAVENOUS

## 2013-12-15 MED ORDER — ALPRAZOLAM 0.25 MG PO TABS: 0.2500 mg | ORAL_TABLET | Freq: Every day | ORAL | Status: DC

## 2013-12-15 MED ORDER — ONDANSETRON HCL 4 MG/2ML IJ SOLN: 4.0000 mg | Freq: Once | INTRAMUSCULAR | Status: DC | PRN

## 2013-12-15 MED ORDER — FENTANYL CITRATE 0.05 MG/ML IJ SOLN
INTRAMUSCULAR | Status: AC
  Filled 2013-12-15: qty 2

## 2013-12-15 MED ORDER — GUAIFENESIN ER 600 MG PO TB12
600.0000 mg | ORAL_TABLET | Freq: Every day | ORAL | Status: DC
  Filled 2013-12-15: qty 1

## 2013-12-15 MED ORDER — VECURONIUM BROMIDE 10 MG IV SOLR
INTRAVENOUS | Status: AC
  Filled 2013-12-15: qty 10

## 2013-12-15 MED ORDER — IPRATROPIUM-ALBUTEROL 0.5-2.5 (3) MG/3ML IN SOLN
3.0000 mL | Freq: Three times a day (TID) | RESPIRATORY_TRACT | Status: DC
  Administered 2013-12-15 – 2013-12-16 (×2): 3 mL via RESPIRATORY_TRACT
  Filled 2013-12-15 (×2): qty 3

## 2013-12-15 MED ORDER — FENTANYL CITRATE 0.05 MG/ML IJ SOLN
INTRAMUSCULAR | Status: DC | PRN
  Administered 2013-12-15: 100 ug via INTRAVENOUS

## 2013-12-15 MED ORDER — LIDOCAINE HCL (CARDIAC) 20 MG/ML IV SOLN
INTRAVENOUS | Status: AC
  Filled 2013-12-15: qty 5

## 2013-12-15 MED ORDER — DEXTROSE 5 % IV SOLN
INTRAVENOUS | Status: DC | PRN
  Administered 2013-12-15: 08:00:00 via INTRAVENOUS

## 2013-12-15 MED ORDER — IPRATROPIUM BROMIDE 0.02 % IN SOLN
0.5000 mg | Freq: Three times a day (TID) | RESPIRATORY_TRACT | Status: DC
  Filled 2013-12-15: qty 2.5

## 2013-12-15 MED ORDER — SODIUM CHLORIDE 0.9 % IJ SOLN
3.0000 mL | Freq: Two times a day (BID) | INTRAMUSCULAR | Status: DC
  Administered 2013-12-15 (×2): 3 mL via INTRAVENOUS

## 2013-12-15 MED ORDER — FENTANYL CITRATE 0.05 MG/ML IJ SOLN
INTRAMUSCULAR | Status: AC
Start: 2013-12-15 — End: 2013-12-15
  Filled 2013-12-15: qty 5

## 2013-12-15 MED ORDER — LACTATED RINGERS IV SOLN
INTRAVENOUS | Status: DC | PRN
  Administered 2013-12-15 (×2): via INTRAVENOUS

## 2013-12-15 MED ORDER — POLYETHYLENE GLYCOL 3350 17 G PO PACK
17.0000 g | PACK | Freq: Every day | ORAL | Status: DC | PRN
  Filled 2013-12-15: qty 1

## 2013-12-15 MED ORDER — GLYCOPYRROLATE 0.2 MG/ML IJ SOLN
INTRAMUSCULAR | Status: AC
  Filled 2013-12-15: qty 2

## 2013-12-15 MED ORDER — NEOSTIGMINE METHYLSULFATE 1 MG/ML IJ SOLN
INTRAMUSCULAR | Status: DC | PRN
  Administered 2013-12-15: 5 mg via INTRAVENOUS

## 2013-12-15 MED ORDER — ONDANSETRON HCL 4 MG/2ML IJ SOLN: 4.0000 mg | INTRAMUSCULAR | Status: DC | PRN

## 2013-12-15 MED ORDER — MENTHOL 3 MG MT LOZG: 1.0000 | LOZENGE | OROMUCOSAL | Status: DC | PRN

## 2013-12-15 MED ORDER — ALBUTEROL SULFATE HFA 108 (90 BASE) MCG/ACT IN AERS
INHALATION_SPRAY | RESPIRATORY_TRACT | Status: DC | PRN
  Administered 2013-12-15: 4 via RESPIRATORY_TRACT

## 2013-12-15 MED ORDER — PROPOFOL 10 MG/ML IV BOLUS
INTRAVENOUS | Status: DC | PRN
  Administered 2013-12-15: 140 mg via INTRAVENOUS

## 2013-12-15 MED ORDER — DEXAMETHASONE SODIUM PHOSPHATE 4 MG/ML IJ SOLN: 4.0000 mg | Freq: Four times a day (QID) | INTRAMUSCULAR | Status: AC

## 2013-12-15 MED ORDER — ONDANSETRON HCL 4 MG/2ML IJ SOLN
INTRAMUSCULAR | Status: AC
  Filled 2013-12-15: qty 2

## 2013-12-15 MED ORDER — LOSARTAN POTASSIUM 25 MG PO TABS
25.0000 mg | ORAL_TABLET | Freq: Every day | ORAL | Status: DC
Start: 2013-12-15 — End: 2013-12-16
  Administered 2013-12-15: 25 mg via ORAL
  Filled 2013-12-15 (×2): qty 1

## 2013-12-15 MED ORDER — HYDROCHLOROTHIAZIDE 25 MG PO TABS
25.0000 mg | ORAL_TABLET | Freq: Every day | ORAL | Status: DC
  Administered 2013-12-15: 25 mg via ORAL
  Filled 2013-12-15 (×2): qty 1

## 2013-12-15 MED ORDER — HYDROMORPHONE HCL PF 1 MG/ML IJ SOLN
INTRAMUSCULAR | Status: AC
  Filled 2013-12-15: qty 1

## 2013-12-15 MED ORDER — ARTIFICIAL TEARS OP OINT
TOPICAL_OINTMENT | OPHTHALMIC | Status: DC | PRN
  Administered 2013-12-15: 1 via OPHTHALMIC

## 2013-12-15 MED ORDER — PREDNISONE 10 MG PO TABS
10.0000 mg | ORAL_TABLET | Freq: Every day | ORAL | Status: DC
  Administered 2013-12-16: 10 mg via ORAL
  Filled 2013-12-15 (×2): qty 1

## 2013-12-15 MED ORDER — PANTOPRAZOLE SODIUM 40 MG PO TBEC
40.0000 mg | DELAYED_RELEASE_TABLET | Freq: Every day | ORAL | Status: DC
  Administered 2013-12-15: 40 mg via ORAL
  Filled 2013-12-15: qty 1

## 2013-12-15 MED ORDER — ALBUTEROL SULFATE HFA 108 (90 BASE) MCG/ACT IN AERS
INHALATION_SPRAY | RESPIRATORY_TRACT | Status: AC
  Filled 2013-12-15: qty 6.7

## 2013-12-15 MED ORDER — SENNA 8.6 MG PO TABS
1.0000 | ORAL_TABLET | Freq: Two times a day (BID) | ORAL | Status: DC
  Administered 2013-12-15: 8.6 mg via ORAL
  Filled 2013-12-15 (×3): qty 1

## 2013-12-15 MED ORDER — METHYLPREDNISOLONE ACETATE 80 MG/ML IJ SUSP
INTRAMUSCULAR | Status: DC | PRN
  Administered 2013-12-15: 80 mg

## 2013-12-15 MED ORDER — HYDROMORPHONE HCL PF 1 MG/ML IJ SOLN: 0.2500 mg | INTRAMUSCULAR | Status: DC | PRN

## 2013-12-15 MED ORDER — ACETAMINOPHEN 650 MG RE SUPP: 650.0000 mg | RECTAL | Status: DC | PRN

## 2013-12-15 MED ORDER — NEOSTIGMINE METHYLSULFATE 1 MG/ML IJ SOLN
INTRAMUSCULAR | Status: AC
  Filled 2013-12-15: qty 10

## 2013-12-15 MED ORDER — KETOROLAC TROMETHAMINE 15 MG/ML IJ SOLN: 15.0000 mg | Freq: Four times a day (QID) | INTRAMUSCULAR | Status: DC

## 2013-12-15 MED ORDER — PHENOL 1.4 % MT LIQD: 1.0000 | OROMUCOSAL | Status: DC | PRN

## 2013-12-15 MED ORDER — PHENYLEPHRINE HCL 10 MG/ML IJ SOLN
INTRAMUSCULAR | Status: DC | PRN
  Administered 2013-12-15: 80 ug via INTRAVENOUS

## 2013-12-15 MED ORDER — KETOROLAC TROMETHAMINE 15 MG/ML IJ SOLN
15.0000 mg | Freq: Four times a day (QID) | INTRAMUSCULAR | Status: DC
  Administered 2013-12-16 (×2): 15 mg via INTRAVENOUS
  Filled 2013-12-15 (×6): qty 1

## 2013-12-15 MED ORDER — DEXAMETHASONE SODIUM PHOSPHATE 4 MG/ML IJ SOLN
INTRAMUSCULAR | Status: DC | PRN
  Administered 2013-12-15: 6 mg via INTRAVENOUS

## 2013-12-15 MED ORDER — SIMVASTATIN 20 MG PO TABS
20.0000 mg | ORAL_TABLET | Freq: Every day | ORAL | Status: DC
  Administered 2013-12-15: 20 mg via ORAL
  Filled 2013-12-15 (×2): qty 1

## 2013-12-15 MED ORDER — LIDOCAINE-EPINEPHRINE 0.5 %-1:200000 IJ SOLN
INTRAMUSCULAR | Status: DC | PRN
  Administered 2013-12-15: 30 mL

## 2013-12-15 MED ORDER — STERILE WATER FOR INJECTION IJ SOLN
INTRAMUSCULAR | Status: AC
  Filled 2013-12-15: qty 10

## 2013-12-15 MED ORDER — MUPIROCIN 2 % EX OINT
TOPICAL_OINTMENT | Freq: Two times a day (BID) | CUTANEOUS | Status: DC
  Administered 2013-12-15: 23:00:00 via NASAL
  Administered 2013-12-15: 1 via NASAL
  Filled 2013-12-15: qty 22

## 2013-12-15 MED ORDER — HEMOSTATIC AGENTS (NO CHARGE) OPTIME
TOPICAL | Status: DC | PRN
  Administered 2013-12-15: 1 via TOPICAL

## 2013-12-15 MED ORDER — ALBUTEROL SULFATE (2.5 MG/3ML) 0.083% IN NEBU
2.5000 mg | INHALATION_SOLUTION | Freq: Four times a day (QID) | RESPIRATORY_TRACT | Status: DC
  Administered 2013-12-15: 2.5 mg via RESPIRATORY_TRACT

## 2013-12-15 MED ORDER — BUDESONIDE 0.25 MG/2ML IN SUSP
0.2500 mg | Freq: Two times a day (BID) | RESPIRATORY_TRACT | Status: DC
  Administered 2013-12-15: 0.25 mg via RESPIRATORY_TRACT
  Filled 2013-12-15 (×3): qty 2

## 2013-12-15 MED ORDER — ROCURONIUM BROMIDE 100 MG/10ML IV SOLN
INTRAVENOUS | Status: DC | PRN
  Administered 2013-12-15: 50 mg via INTRAVENOUS

## 2013-12-15 SURGICAL SUPPLY — 61 items
ADH SKN CLS APL DERMABOND .7 (GAUZE/BANDAGES/DRESSINGS) ×1
ADH SKN CLS LQ APL DERMABOND (GAUZE/BANDAGES/DRESSINGS) ×1
APL SKNCLS STERI-STRIP NONHPOA (GAUZE/BANDAGES/DRESSINGS)
BAG DECANTER FOR FLEXI CONT (MISCELLANEOUS) ×3 IMPLANT
BENZOIN TINCTURE PRP APPL 2/3 (GAUZE/BANDAGES/DRESSINGS) IMPLANT
BLADE SURG ROTATE 9660 (MISCELLANEOUS) IMPLANT
BUR MATCHSTICK NEURO 3.0 LAGG (BURR) ×3 IMPLANT
CANISTER SUCT 3000ML (MISCELLANEOUS) ×3 IMPLANT
CLOSURE WOUND 1/2 X4 (GAUZE/BANDAGES/DRESSINGS)
CONT SPEC 4OZ CLIKSEAL STRL BL (MISCELLANEOUS) ×3 IMPLANT
DECANTER SPIKE VIAL GLASS SM (MISCELLANEOUS) ×3 IMPLANT
DERMABOND ADHESIVE PROPEN (GAUZE/BANDAGES/DRESSINGS) ×2
DERMABOND ADVANCED (GAUZE/BANDAGES/DRESSINGS) ×2
DERMABOND ADVANCED .7 DNX12 (GAUZE/BANDAGES/DRESSINGS) ×1 IMPLANT
DERMABOND ADVANCED .7 DNX6 (GAUZE/BANDAGES/DRESSINGS) IMPLANT
DRAPE LAPAROTOMY 100X72X124 (DRAPES) ×3 IMPLANT
DRAPE MICROSCOPE LEICA (MISCELLANEOUS) ×3 IMPLANT
DRAPE POUCH INSTRU U-SHP 10X18 (DRAPES) ×3 IMPLANT
DRAPE SURG 17X23 STRL (DRAPES) ×3 IMPLANT
DURAPREP 26ML APPLICATOR (WOUND CARE) ×3 IMPLANT
ELECT REM PT RETURN 9FT ADLT (ELECTROSURGICAL) ×3
ELECTRODE REM PT RTRN 9FT ADLT (ELECTROSURGICAL) ×1 IMPLANT
GAUZE SPONGE 4X4 16PLY XRAY LF (GAUZE/BANDAGES/DRESSINGS) IMPLANT
GLOVE BIO SURGEON STRL SZ7.5 (GLOVE) ×4 IMPLANT
GLOVE BIOGEL PI IND STRL 7.5 (GLOVE) IMPLANT
GLOVE BIOGEL PI INDICATOR 7.5 (GLOVE) ×4
GLOVE ECLIPSE 6.5 STRL STRAW (GLOVE) ×3 IMPLANT
GLOVE ECLIPSE 7.0 STRL STRAW (GLOVE) ×2 IMPLANT
GLOVE EXAM NITRILE LRG STRL (GLOVE) IMPLANT
GLOVE EXAM NITRILE MD LF STRL (GLOVE) IMPLANT
GLOVE EXAM NITRILE XL STR (GLOVE) IMPLANT
GLOVE EXAM NITRILE XS STR PU (GLOVE) IMPLANT
GOWN BRE IMP SLV AUR LG STRL (GOWN DISPOSABLE) ×6 IMPLANT
GOWN BRE IMP SLV AUR XL STRL (GOWN DISPOSABLE) IMPLANT
GOWN STRL REIN 2XL LVL4 (GOWN DISPOSABLE) IMPLANT
GOWN STRL REUS W/ TWL LRG LVL3 (GOWN DISPOSABLE) IMPLANT
GOWN STRL REUS W/ TWL XL LVL3 (GOWN DISPOSABLE) IMPLANT
GOWN STRL REUS W/TWL LRG LVL3 (GOWN DISPOSABLE) ×3
GOWN STRL REUS W/TWL XL LVL3 (GOWN DISPOSABLE) ×6
KIT BASIN OR (CUSTOM PROCEDURE TRAY) ×3 IMPLANT
KIT ROOM TURNOVER OR (KITS) ×3 IMPLANT
NDL HYPO 25X1 1.5 SAFETY (NEEDLE) ×1 IMPLANT
NDL SPNL 18GX3.5 QUINCKE PK (NEEDLE) IMPLANT
NEEDLE HYPO 25X1 1.5 SAFETY (NEEDLE) ×3 IMPLANT
NEEDLE SPNL 18GX3.5 QUINCKE PK (NEEDLE) IMPLANT
NS IRRIG 1000ML POUR BTL (IV SOLUTION) ×3 IMPLANT
PACK LAMINECTOMY NEURO (CUSTOM PROCEDURE TRAY) ×3 IMPLANT
PAD ARMBOARD 7.5X6 YLW CONV (MISCELLANEOUS) ×9 IMPLANT
RUBBERBAND STERILE (MISCELLANEOUS) ×6 IMPLANT
SPONGE GAUZE 4X4 12PLY (GAUZE/BANDAGES/DRESSINGS) IMPLANT
SPONGE LAP 4X18 X RAY DECT (DISPOSABLE) IMPLANT
SPONGE SURGIFOAM ABS GEL SZ50 (HEMOSTASIS) ×3 IMPLANT
STRIP CLOSURE SKIN 1/2X4 (GAUZE/BANDAGES/DRESSINGS) IMPLANT
SUT VIC AB 0 CT1 18XCR BRD8 (SUTURE) ×1 IMPLANT
SUT VIC AB 0 CT1 8-18 (SUTURE) ×3
SUT VIC AB 2-0 CT1 18 (SUTURE) ×3 IMPLANT
SUT VIC AB 3-0 SH 8-18 (SUTURE) ×5 IMPLANT
SYR 20ML ECCENTRIC (SYRINGE) ×3 IMPLANT
TOWEL OR 17X24 6PK STRL BLUE (TOWEL DISPOSABLE) ×3 IMPLANT
TOWEL OR 17X26 10 PK STRL BLUE (TOWEL DISPOSABLE) ×3 IMPLANT
WATER STERILE IRR 1000ML POUR (IV SOLUTION) ×3 IMPLANT

## 2013-12-15 NOTE — Anesthesia Postprocedure Evaluation (Signed)
  Anesthesia Post-op Note  Patient: [name]  Procedure(s) Performed: Procedure(s) with comments: LUMBAR LAMINECTOMY/DECOMPRESSION MICRODISCECTOMY 1 LEVEL two/three (Right) - Right L23 microdiskectomy  Patient Location: PACU  Anesthesia Type:General  Level of Consciousness: awake, alert , oriented and patient cooperative  Airway and Oxygen Therapy: Patient Spontanous Breathing  Post-op Pain: mild  Post-op Assessment: Post-op Vital signs reviewed, Patient's Cardiovascular Status Stable, Respiratory Function Stable, Patent Airway, No signs of Nausea or vomiting and Pain level controlled  Post-op Vital Signs: stable  Complications: No apparent anesthesia complications

## 2013-12-15 NOTE — Plan of Care (Signed)
Problem: Consults Goal: Diagnosis - Spinal Surgery Outcome: Completed/Met Date Met:  12/15/13 Lumbar Laminectomy (Complex)

## 2013-12-15 NOTE — Preoperative (Signed)
Beta Blockers   Reason not to administer Beta Blockers:Not Applicable 

## 2013-12-15 NOTE — Op Note (Signed)
12/15/2013  10:07 AM  PATIENT:  [name]  80 y.o. female  PRE-OPERATIVE DIAGNOSIS:  lumbar herniated disc far lateral L2/3 right  POST-OPERATIVE DIAGNOSIS:  lumbar herniated disc right far lateral L2/3  PROCEDURE:  Procedure(s): LUMBAR LAMINECTOMY/DECOMPRESSION MICRODISCECTOMY 1 LEVEL two/three  SURGEON:  Oberon Hehir ASSISTANTS:none  ANESTHESIA:   general  EBL:  Total I/O In: 1200 [I.V.:1200] Out: -   BLOOD ADMINISTERED:none  CELL SAVER GIVEN:none  COUNT:per nursing  DRAINS: none   SPECIMEN:  No Specimen  DICTATION: [name] is a 80 y.o. female whom  was taken to the operating room, intubated and placed under a general anesthetic without difficulty. she was positioned prone on a Wilson frame with all pressure points padded. Her back was prepped and draped in a sterile manner. I opened the skin with a 10 blade and carried the dissection down to the thoracolumbar fascia. I used both sharp dissection and the monopolar cautery to expose the lamina of L2,L1, . I  Thought  I hadconfirmed my location with an intraoperative xray. I initially exposed the L1 pars and nerve root. With microdissection I did not appreciate an abnormality at this level. I took another xray and I was at L1 instead of L2. I then moved down one level.  I used the drill, Kerrison punches, and curettes to perform a partial resection of the pars of L2. I used the punches to remove the ligamentum flavum to expose the thecal sac. I brought the microscope into the operative field and with I started the decompression of the nerve root, thecal sac and L2 root(s). I cauterized epidural veins overlying the disc space then divided them sharply. I exposed the L2 root which was taut and heaped in appearance. Using hooks I dissected anterior to the root and freed some of the disc fragment. Continuing this I was able to free the root after removing a good deal of disc. I was then able to fully appreciate the opening in  the disc space and removed more disc with rongeurs.  After the discectomy was completed I inspected the L2 nerve root and felt it was well decompressed. I explored rostrally, laterally, medially, and caudally and was satisfied with the decompression. I irrigated the wound, then closed in layers. I approximated the thoracolumbar fascia, subcutaneous, and subcuticular planes with vicryl sutures. I used dermabond for a sterile dressing.   PLAN OF CARE: Admit for overnight observation  PATIENT DISPOSITION:  PACU - hemodynamically stable.   Delay start of Pharmacological VTE agent (>24hrs) due to surgical blood loss or risk of bleeding:  yes

## 2013-12-15 NOTE — Transfer of Care (Signed)
Immediate Anesthesia Transfer of Care Note  Patient: [name]  Procedure(s) Performed: Procedure(s) with comments: LUMBAR LAMINECTOMY/DECOMPRESSION MICRODISCECTOMY 1 LEVEL two/three (Right) - Right L23 microdiskectomy  Patient Location: PACU  Anesthesia Type:General  Level of Consciousness: oriented, sedated, patient cooperative and responds to stimulation  Airway & Oxygen Therapy: Patient Spontanous Breathing and Patient connected to nasal cannula oxygen  Post-op Assessment: Report given to PACU RN, Post -op Vital signs reviewed and stable, Patient moving all extremities and Patient moving all extremities X 4  Post vital signs: Reviewed and stable  Complications: No apparent anesthesia complications

## 2013-12-15 NOTE — H&P (Signed)
  BP 130/74  Pulse 103  Temp(Src) 98.3 F (36.8 C)  Resp 20  Ht 5\' 2"  (1.575 m)  Wt 65.2 kg (143 lb 11.8 oz)  BMI 26.28 kg/m2  SpO2 94% [name] is a long-term patient of mine, whom I took to the operating room for lumbar disc in 2003. She actually did fairly well afterwards, but I have seen her few times in the office for problems. [name] comes in today specifically because she was having pain since last week, Tuesday, which she describes as acute in its onset and severe. She states the pain has gone by Wednesday, and she then she said that sitting hurts, and pain goes down her right lower extremity. She went back to the doctor after the pain started again. She took muscle relaxants, Tylenol, it did not help. She states she called [place] Saturday morning about a week ago and tried Aleve per Dr. Hal Neer, that did not help. She states the pain does not travel below the knee. She states again sitting is some much worse than anything else. REVIEW OF SYSTEMS: Positive for asthma, emphysema, shortness of breath, arthritis, bronchitis. Has a history of multiple sclerosis, hypertension, and lung disease. She is right handed. PAST MEDICAL HISTORY:   Prior Operations: Includes lumbar surgery I performed and some other surgery which she cannot remember in 1978.  Medications and Allergies: Advil, albuterol, budesonide, Flexeril, hydrochlorothiazide, hydrocodone, ipratropium bromide, levalbuterol, losartan, Mucinex, omeprazole, Perforomist, prednisone, simvastatin, Valium, Xanax. ALLERGIES TO AMOXICILLIN, DILTIAZEM, MONTELUKAST, POTASSIUM CLAVULANATE, ROFLUMILAST, AND ZAFIRLUKAST. PHYSICAL EXAMINATION: [name] on exam is alert, oriented x4 and answering all questions appropriately. She has 5/5 strength in the upper and lower extremities. 2+ reflexes at the knees. Trace at the ankles. Normal muscle tone, bulk, and coordination.. Pupils are equal, round and reactive to light. Full extraocular movements. Full  visual fields. Hearing intact to finger rub. Vital signs follows: Height not recorded. Weight 147.8 pounds. BMI 26.18, blood pressure is 152/92, pulse is 113. DATA: The MRI shows that she has a herniated disc at L2-3 on the right side, which I do believe is causing her problem, and which is what I am going to go after, and it is lateral to the canal and the neural foramen, but certainly appears to be the culprit. IMPRESSION/PLAN: Given that, I think an operation would be in order. With the amount of pain that she is in, and her desire to just have this treated as quickly as possible, I think an operation is certainly her best option, and she says in her own words that the pain was "very severe" and not something that she wanted to tolerate. What we will do is get her set up for an operation to take out this disc.

## 2013-12-15 NOTE — Anesthesia Procedure Notes (Signed)
Procedure Name: Intubation Date/Time: 12/15/2013 8:16 AM Performed by: Jacquiline Doe A Pre-anesthesia Checklist: Patient identified, Timeout performed, Emergency Drugs available, Suction available and Patient being monitored Patient Re-evaluated:Patient Re-evaluated prior to inductionOxygen Delivery Method: Circle system utilized Preoxygenation: Pre-oxygenation with 100% oxygen Intubation Type: IV induction and Cricoid Pressure applied Ventilation: Mask ventilation without difficulty and Oral airway inserted - appropriate to patient size Laryngoscope Size: Mac and 3 Grade View: Grade I Tube type: Oral Tube size: 7.5 mm Number of attempts: 1 Airway Equipment and Method: Stylet and LTA kit utilized Placement Confirmation: ETT inserted through vocal cords under direct vision,  breath sounds checked- equal and bilateral and positive ETCO2 Secured at: 22 cm Tube secured with: Tape Dental Injury: Teeth and Oropharynx as per pre-operative assessment

## 2013-12-15 NOTE — Discharge Instructions (Addendum)
Lumbar Discectomy °Care After °A discectomy involves removal of discmaterial (the cartilage-like structures located between the bones of the back). It is done to relieve pressure on nerve roots. It can be used as a treatment for a back problem. The time in surgery depends on the findings in surgery and what is necessary to correct the problems. °HOME CARE INSTRUCTIONS  °· Check the cut (incision) made by the surgeon twice a day for signs of infection. Some signs of infection may include:  °· A foul smelling, greenish or yellowish discharge from the wound.  °· Increased pain.  °· Increased redness over the incision (operative) site.  °· The skin edges may separate.  °· Flu-like symptoms (problems).  °· A temperature above 101.5° F (38.6° C).  °· Change your bandages in about 24 to 36 hours following surgery or as directed.  °· You may shower tomrrow.  Avoid bathtubs, swimming pools and hot tubs for three weeks or until your incision has healed completely. °· Follow your doctor's instructions as to safe activities, exercises, and physical therapy.  °· Weight reduction may be beneficial if you are overweight.  °· Daily exercise is helpful to prevent the return of problems. Walking is permitted. You may use a treadmill without an incline. Cut down on activities and exercise if you have discomfort. You may also go up and down stairs as much as you can tolerate.  °· DO NOT lift anything heavier than 10 to 15 lbs. Avoid bending or twisting at the waist. Always bend your knees when lifting.  °· Maintain strength and range of motion as instructed.  °· Do not drive for 10 days, or as directed by your doctors. You may be a passenger . Lying back in the passenger seat may be more comfortable for you. Always wear a seatbelt.  °· Limit your sitting in a regular chair to 20 to 30 minutes at a time. There are no limitations for sitting in a recliner. You should lie down or walk in between sitting periods.  °· Only take  over-the-counter or prescription medicines for pain, discomfort, or fever as directed by your caregiver.  °SEEK MEDICAL CARE IF:  °· There is increased bleeding (more than a small spot) from the wound.  °· You notice redness, swelling, or increasing pain in the wound.  °· Pus is coming from wound.  °· You develop an unexplained oral temperature above 102° F (38.9° C) develops.  °· You notice a foul smell coming from the wound or dressing.  °· You have increasing pain in your wound.  °SEEK IMMEDIATE MEDICAL CARE IF:  °· You develop a rash.  °· You have difficulty breathing.  °· You develop any allergic problems to medicines given.  °Document Released: 08/12/2004 Document Revised: 08/27/2011 Document Reviewed: 12/01/2007 °ExitCare® Patient Information ° ° ° °Wound Care °Leave incision open to air. °You may shower. °Do not scrub directly on incision.  °Do not put any creams, lotions, or ointments on incision. °Activity °Walk each and every day, increasing distance each day. °No lifting greater than 5 lbs.  Avoid bending, arching, and twisting. °No driving for 2 weeks; may ride as a passenger locally. °If provided with back brace, wear when out of bed.  It is not necessary to wear in bed. °Diet °Resume your normal diet.  °Return to Work °Will be discussed at you follow up appointment. °Call Your Doctor If Any of These Occur °Redness, drainage, or swelling at the wound.  °Temperature greater than   101 degrees. °Severe pain not relieved by pain medication. °Incision starts to come apart. °Follow Up Appt °Call today for appointment in 4 weeks ([phone]) or for problems.  If you have any hardware placed in your spine, you will need an x-ray before your appointment. °

## 2013-12-15 NOTE — Discharge Summary (Signed)
Physician Discharge Summary  Patient ID: [name] MRN: [id number] DOB/AGE: 1932-10-16 80 y.o.  Admit date: 12/15/2013 Discharge date: 12/15/2013  Admission Diagnoses:HNP far lateral right L2/3  Discharge Diagnoses: HNP far lateral right L2/3 Active Problems:   HNP (herniated nucleus pulposus), lumbar   Discharged Condition: good  Hospital Course: [name] was admitted and taken to the operating room for an uncomplicated lumbar discetomy. Postop she has ambulated, voided, and tolerated a regular diet. Her wound is clean, dry, and without signs of infection.   Consults: None  Significant Diagnostic Studies: none  Treatments: surgery: far lateral L2/3 discetomy, right  Discharge Exam: Blood pressure 149/78, pulse 88, temperature 98 F (36.7 C), resp. rate 18, height 5\' 2"  (1.575 m), weight 65.2 kg (143 lb 11.8 oz), SpO2 94.00%. General appearance: alert, cooperative and appears stated age Neurologic: Alert and oriented X 3, normal strength and tone. Normal symmetric reflexes. Normal coordination and gait  Disposition: 01-Home or Self Care   Future Appointments Provider Department Dept Phone   01/12/2014 10:00 AM Deneise Lever, MD [facility] [phone]       Medication List         albuterol 108 (90 BASE) MCG/ACT inhaler  Commonly known as:  PROVENTIL HFA;VENTOLIN HFA  Inhale 2 puffs into the lungs 2 (two) times daily as needed for wheezing or shortness of breath (copd/asthma).     ALPRAZolam 0.25 MG tablet  Commonly known as:  XANAX  Take 0.25 mg by mouth daily.     budesonide 0.25 MG/2ML nebulizer solution  Commonly known as:  PULMICORT  Take 0.25 mg by nebulization 2 (two) times daily.     formoterol 20 MCG/2ML nebulizer solution  Commonly known as:  PERFOROMIST  Take 2 mLs (20 mcg total) by nebulization 2 (two) times daily.     guaiFENesin 600 MG 12 hr tablet  Commonly known as:  MUCINEX  Take 600 mg by mouth daily.     hydrochlorothiazide 25 MG tablet  Commonly known as:  HYDRODIURIL  Take 25 mg by mouth daily.     ipratropium 0.02 % nebulizer solution  Commonly known as:  ATROVENT  Take 0.5 mg by nebulization 3 (three) times daily.     levalbuterol 0.63 MG/3ML nebulizer solution  Commonly known as:  XOPENEX  Take 0.63 mg by nebulization 2 (two) times daily.     losartan 25 MG tablet  Commonly known as:  COZAAR  Take 1 tablet (25 mg total) by mouth daily.     omeprazole 20 MG capsule  Commonly known as:  PRILOSEC  Take 20 mg by mouth 2 (two) times daily before a meal.     oxyCODONE-acetaminophen 7.5-325 MG per tablet  Commonly known as:  PERCOCET  Take 1 tablet by mouth at bedtime.     predniSONE 10 MG tablet  Commonly known as:  DELTASONE  Take 10 mg by mouth daily with breakfast. Continuous course     simvastatin 20 MG tablet  Commonly known as:  ZOCOR  Take 20 mg by mouth daily.           Follow-up Information   Follow up with Shabreka Coulon L, MD In 3 weeks.   Specialty:  Neurosurgery   Contact information:   [address], [address]                         [address] [place] [address] [phone]  Signed: Rashaunda Rahl L 12/15/2013, 6:41 PM

## 2013-12-15 NOTE — Anesthesia Preprocedure Evaluation (Addendum)
Anesthesia Evaluation  Patient identified by MRN, date of birth, ID band Patient awake    History of Anesthesia Complications (+) PONV  Airway       Dental   Pulmonary asthma , COPDformer smoker,          Cardiovascular hypertension, + Peripheral Vascular Disease and +CHF + dysrhythmias Atrial Fibrillation + Valvular Problems/Murmurs AS     Neuro/Psych    GI/Hepatic GERD-  ,  Endo/Other    Renal/GU      Musculoskeletal  (+) Arthritis -,   Abdominal   Peds  Hematology   Anesthesia Other Findings   Reproductive/Obstetrics                          Anesthesia Physical Anesthesia Plan  ASA: III  Anesthesia Plan: General   Post-op Pain Management:    Induction: Intravenous  Airway Management Planned: Oral ETT  Additional Equipment:   Intra-op Plan:   Post-operative Plan: Extubation in OR  Informed Consent: I have reviewed the patients History and Physical, chart, labs and discussed the procedure including the risks, benefits and alternatives for the proposed anesthesia with the patient or authorized representative who has indicated his/her understanding and acceptance.     Plan Discussed with:   Anesthesia Plan Comments:         Anesthesia Quick Evaluation

## 2013-12-16 DIAGNOSIS — J438 Other emphysema: Secondary | ICD-10-CM | POA: Diagnosis not present

## 2013-12-16 DIAGNOSIS — I10 Essential (primary) hypertension: Secondary | ICD-10-CM | POA: Diagnosis not present

## 2013-12-16 DIAGNOSIS — J45909 Unspecified asthma, uncomplicated: Secondary | ICD-10-CM | POA: Diagnosis not present

## 2013-12-16 DIAGNOSIS — G35 Multiple sclerosis: Secondary | ICD-10-CM | POA: Diagnosis not present

## 2013-12-16 DIAGNOSIS — Z01812 Encounter for preprocedural laboratory examination: Secondary | ICD-10-CM | POA: Diagnosis not present

## 2013-12-16 DIAGNOSIS — M5126 Other intervertebral disc displacement, lumbar region: Secondary | ICD-10-CM | POA: Diagnosis not present

## 2013-12-16 MED ORDER — CHLORHEXIDINE GLUCONATE CLOTH 2 % EX PADS
6.0000 | MEDICATED_PAD | Freq: Every day | CUTANEOUS | Status: DC
  Administered 2013-12-16: 6 via TOPICAL

## 2013-12-16 NOTE — Progress Notes (Signed)
Pt. Alert and oriented,follows simple instructions, denies pain. Incision area without swelling, redness or S/S of infection. Voiding adequate clear yellow urine. Moving all extremities well and vitals stable and documented. Patient discharged home with family.  Lumbar surgery notes instructions given to patient and family member for home safety and precautions. Pt. and family stated understanding of instructions given 

## 2013-12-19 ENCOUNTER — Encounter (HOSPITAL_COMMUNITY): Payer: Self-pay | Admitting: Neurosurgery

## 2013-12-26 ENCOUNTER — Ambulatory Visit: Payer: Medicare Other | Admitting: Pulmonary Disease

## 2013-12-27 ENCOUNTER — Ambulatory Visit: Payer: Medicare Other | Admitting: Internal Medicine

## 2014-01-12 ENCOUNTER — Encounter: Payer: Self-pay | Admitting: Internal Medicine

## 2014-01-12 ENCOUNTER — Ambulatory Visit (INDEPENDENT_AMBULATORY_CARE_PROVIDER_SITE_OTHER): Payer: Medicare Other | Admitting: Internal Medicine

## 2014-01-12 VITALS — BP 144/78 | HR 110 | Ht 62.0 in | Wt 140.0 lb

## 2014-01-12 DIAGNOSIS — I4891 Unspecified atrial fibrillation: Secondary | ICD-10-CM

## 2014-01-12 DIAGNOSIS — I480 Paroxysmal atrial fibrillation: Secondary | ICD-10-CM

## 2014-01-12 DIAGNOSIS — J449 Chronic obstructive pulmonary disease, unspecified: Secondary | ICD-10-CM

## 2014-01-12 NOTE — Patient Instructions (Signed)
We can continue present meds  Please call as needed 
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
## 2014-01-23 DIAGNOSIS — Z1231 Encounter for screening mammogram for malignant neoplasm of breast: Secondary | ICD-10-CM | POA: Diagnosis not present

## 2014-01-29 ENCOUNTER — Telehealth: Payer: Self-pay | Admitting: Internal Medicine

## 2014-01-29 DIAGNOSIS — J441 Chronic obstructive pulmonary disease with (acute) exacerbation: Secondary | ICD-10-CM | POA: Diagnosis not present

## 2014-01-29 NOTE — Telephone Encounter (Signed)
Agree with what she is doing. Let us know if she needs an antibiotic or knows of anything else she thinks would help.

## 2014-01-29 NOTE — Telephone Encounter (Signed)
ATC line busy x 4 wcb 

## 2014-01-29 NOTE — Telephone Encounter (Signed)
Spoke w/ pt. C/o difficulty breathing, wheezing, chest tx x 4-5 days. Denies any cough. She started taking 4 tabs of prednisone 10 mg x 3 days then cut down to pred 10 mg 3 tabs today. Requesting recs Please advise Dr. Annamaria Boots thanks  Allergies  Allergen Reactions  . Amoxicillin-Pot Clavulanate     GI upset  . Daliresp [Roflumilast] Other (See Comments)    unknown  . Diltiazem Nausea Only  . Montelukast Sodium Other (See Comments)     flu-like symptoms  . Zafirlukast Other (See Comments)    unknown  . Adhesive [Tape] Rash    Please use paper tape     Current Outpatient Prescriptions on File Prior to Visit  Medication Sig Dispense Refill  . albuterol (PROVENTIL HFA;VENTOLIN HFA) 108 (90 BASE) MCG/ACT inhaler Inhale 2 puffs into the lungs 2 (two) times daily as needed for wheezing or shortness of breath (copd/asthma).       . ALPRAZolam (XANAX) 0.25 MG tablet Take 0.25 mg by mouth daily.      . budesonide (PULMICORT) 0.25 MG/2ML nebulizer solution Take 0.25 mg by nebulization 2 (two) times daily.      . formoterol (PERFOROMIST) 20 MCG/2ML nebulizer solution Take 2 mLs (20 mcg total) by nebulization 2 (two) times daily.  120 mL  prn  . guaiFENesin (MUCINEX) 600 MG 12 hr tablet Take 600 mg by mouth daily.      . hydrochlorothiazide 25 MG tablet Take 25 mg by mouth daily.       Marland Kitchen ipratropium (ATROVENT) 0.02 % nebulizer solution Take 0.5 mg by nebulization 3 (three) times daily.      Marland Kitchen levalbuterol (XOPENEX) 0.63 MG/3ML nebulizer solution Take 0.63 mg by nebulization 2 (two) times daily.       Marland Kitchen losartan (COZAAR) 25 MG tablet Take 1 tablet (25 mg total) by mouth daily.  30 tablet  11  . omeprazole (PRILOSEC) 20 MG capsule Take 20 mg by mouth 2 (two) times daily before a meal.       . oxyCODONE-acetaminophen (PERCOCET) 7.5-325 MG per tablet Take 1 tablet by mouth at bedtime.       . predniSONE (DELTASONE) 10 MG tablet Take 10 mg by mouth daily with breakfast. Continuous course      .  simvastatin (ZOCOR) 20 MG tablet Take 20 mg by mouth daily.       . [DISCONTINUED] metoprolol tartrate (LOPRESSOR) 25 MG tablet Take 1 tablet (25 mg total) by mouth 2 (two) times daily.  30 tablet  0  . [DISCONTINUED] pantoprazole (PROTONIX) 40 MG tablet Take 1 tablet (40 mg total) by mouth daily at 12 noon.  10 tablet  0   No current facility-administered medications on file prior to visit.

## 2014-01-29 NOTE — Telephone Encounter (Signed)
I called spoke with pt. Aware of recs. Nothing further needed 

## 2014-02-01 ENCOUNTER — Telehealth: Payer: Self-pay | Admitting: Internal Medicine

## 2014-02-01 NOTE — Telephone Encounter (Signed)
Per CY-see if she can take Doxycycline 100mg  #14 take 1 po BID no refills.   LMTCB

## 2014-02-01 NOTE — Telephone Encounter (Signed)
Spoke with the pt  She states still having increased SOB, wheezing, chest congestion  She had called on Monday 01/29/14 with these co's, and states she increased pred to 60 mg daily  She has been taking 60 mg daily until today, she took 50 mg  She states that normally this helps, but not this time  She has began to cough, but unable to produce any sputum  No other new co's  Please advise thanks! Allergies  Allergen Reactions  . Amoxicillin-Pot Clavulanate     GI upset  . Daliresp [Roflumilast] Other (See Comments)    unknown  . Diltiazem Nausea Only  . Montelukast Sodium Other (See Comments)     flu-like symptoms  . Zafirlukast Other (See Comments)    unknown  . Adhesive [Tape] Rash    Please use paper tape   Current Outpatient Prescriptions on File Prior to Visit  Medication Sig Dispense Refill  . albuterol (PROVENTIL HFA;VENTOLIN HFA) 108 (90 BASE) MCG/ACT inhaler Inhale 2 puffs into the lungs 2 (two) times daily as needed for wheezing or shortness of breath (copd/asthma).       . ALPRAZolam (XANAX) 0.25 MG tablet Take 0.25 mg by mouth daily.      . budesonide (PULMICORT) 0.25 MG/2ML nebulizer solution Take 0.25 mg by nebulization 2 (two) times daily.      . formoterol (PERFOROMIST) 20 MCG/2ML nebulizer solution Take 2 mLs (20 mcg total) by nebulization 2 (two) times daily.  120 mL  prn  . guaiFENesin (MUCINEX) 600 MG 12 hr tablet Take 600 mg by mouth daily.      . hydrochlorothiazide 25 MG tablet Take 25 mg by mouth daily.       Marland Kitchen ipratropium (ATROVENT) 0.02 % nebulizer solution Take 0.5 mg by nebulization 3 (three) times daily.      Marland Kitchen levalbuterol (XOPENEX) 0.63 MG/3ML nebulizer solution Take 0.63 mg by nebulization 2 (two) times daily.       Marland Kitchen losartan (COZAAR) 25 MG tablet Take 1 tablet (25 mg total) by mouth daily.  30 tablet  11  . omeprazole (PRILOSEC) 20 MG capsule Take 20 mg by mouth 2 (two) times daily before a meal.       . oxyCODONE-acetaminophen (PERCOCET) 7.5-325 MG  per tablet Take 1 tablet by mouth at bedtime.       . predniSONE (DELTASONE) 10 MG tablet Take 10 mg by mouth daily with breakfast. Continuous course      . simvastatin (ZOCOR) 20 MG tablet Take 20 mg by mouth daily.       . [DISCONTINUED] metoprolol tartrate (LOPRESSOR) 25 MG tablet Take 1 tablet (25 mg total) by mouth 2 (two) times daily.  30 tablet  0  . [DISCONTINUED] pantoprazole (PROTONIX) 40 MG tablet Take 1 tablet (40 mg total) by mouth daily at 12 noon.  10 tablet  0   No current facility-administered medications on file prior to visit.

## 2014-02-02 MED ORDER — DOXYCYCLINE HYCLATE 100 MG PO TABS: 100.0000 mg | ORAL_TABLET | Freq: Two times a day (BID) | ORAL | Status: DC

## 2014-02-02 NOTE — Telephone Encounter (Signed)
Pt advised and rx sent. Conley Pawling, CMA  

## 2014-02-06 ENCOUNTER — Encounter: Payer: Self-pay | Admitting: Internal Medicine

## 2014-02-06 ENCOUNTER — Telehealth: Payer: Self-pay | Admitting: Internal Medicine

## 2014-02-06 ENCOUNTER — Ambulatory Visit (INDEPENDENT_AMBULATORY_CARE_PROVIDER_SITE_OTHER): Payer: Medicare Other | Admitting: Internal Medicine

## 2014-02-06 VITALS — BP 120/72 | HR 109 | Ht 62.0 in | Wt 138.2 lb

## 2014-02-06 DIAGNOSIS — J0100 Acute maxillary sinusitis, unspecified: Secondary | ICD-10-CM | POA: Diagnosis not present

## 2014-02-06 DIAGNOSIS — J449 Chronic obstructive pulmonary disease, unspecified: Secondary | ICD-10-CM | POA: Diagnosis not present

## 2014-02-06 MED ORDER — FLUNISOLIDE HFA 80 MCG/ACT IN AERS: INHALATION_SPRAY | RESPIRATORY_TRACT | Status: DC
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
## 2014-02-06 NOTE — Patient Instructions (Signed)
Sample Aerospan steroid inhaler   2 puffs then rinse mouth twice daily    Use this daily until you use it up.

## 2014-02-06 NOTE — Telephone Encounter (Signed)
Spoke with the pt  She states her wheezing and SOB are no better  She is currently taking doxy and does not feel that its helping  OV with CDY at 11 am today

## 2014-02-09 NOTE — Assessment & Plan Note (Signed)
Rhythm feels like sinus at this time

## 2014-02-09 NOTE — Assessment & Plan Note (Signed)
Currently getting back in control after recent exacerbation.

## 2014-02-20 ENCOUNTER — Other Ambulatory Visit: Payer: Self-pay | Admitting: Internal Medicine

## 2014-02-28 DIAGNOSIS — H8110 Benign paroxysmal vertigo, unspecified ear: Secondary | ICD-10-CM | POA: Diagnosis not present

## 2014-03-07 DIAGNOSIS — E785 Hyperlipidemia, unspecified: Secondary | ICD-10-CM | POA: Diagnosis not present

## 2014-03-07 DIAGNOSIS — J449 Chronic obstructive pulmonary disease, unspecified: Secondary | ICD-10-CM | POA: Diagnosis not present

## 2014-03-07 DIAGNOSIS — I10 Essential (primary) hypertension: Secondary | ICD-10-CM | POA: Diagnosis not present

## 2014-03-07 DIAGNOSIS — G35 Multiple sclerosis: Secondary | ICD-10-CM | POA: Diagnosis not present

## 2014-03-07 DIAGNOSIS — I739 Peripheral vascular disease, unspecified: Secondary | ICD-10-CM | POA: Diagnosis not present

## 2014-03-21 ENCOUNTER — Other Ambulatory Visit: Payer: Self-pay | Admitting: Internal Medicine

## 2014-03-24 NOTE — Assessment & Plan Note (Signed)
Controlled/remission

## 2014-03-24 NOTE — Assessment & Plan Note (Signed)
We would feel better if she were little more stable with less daily prednisone. She is exhausted by caring for her husband and her home. Plan-sample steroid inhaler Aerospan to try to stay short-term booster

## 2014-03-28 ENCOUNTER — Telehealth: Payer: Self-pay | Admitting: Internal Medicine

## 2014-03-28 MED ORDER — IPRATROPIUM BROMIDE 0.02 % IN SOLN: RESPIRATORY_TRACT | Status: DC

## 2014-03-28 NOTE — Telephone Encounter (Signed)
Called spoke with Estill Bamberg from [facility]. She reports the RX needed DX Code on it. I have done so. Nothing further needed

## 2014-04-04 ENCOUNTER — Other Ambulatory Visit: Payer: Self-pay | Admitting: Internal Medicine

## 2014-04-09 DIAGNOSIS — S81809A Unspecified open wound, unspecified lower leg, initial encounter: Secondary | ICD-10-CM | POA: Diagnosis not present

## 2014-04-09 DIAGNOSIS — S81009A Unspecified open wound, unspecified knee, initial encounter: Secondary | ICD-10-CM | POA: Diagnosis not present

## 2014-04-09 DIAGNOSIS — Z6825 Body mass index (BMI) 25.0-25.9, adult: Secondary | ICD-10-CM | POA: Diagnosis not present

## 2014-04-10 ENCOUNTER — Ambulatory Visit (INDEPENDENT_AMBULATORY_CARE_PROVIDER_SITE_OTHER): Payer: Medicare Other | Admitting: Internal Medicine

## 2014-04-10 ENCOUNTER — Encounter: Payer: Self-pay | Admitting: Internal Medicine

## 2014-04-10 VITALS — BP 124/76 | HR 112 | Ht 62.0 in | Wt 140.6 lb

## 2014-04-10 DIAGNOSIS — J3089 Other allergic rhinitis: Secondary | ICD-10-CM

## 2014-04-10 DIAGNOSIS — J441 Chronic obstructive pulmonary disease with (acute) exacerbation: Secondary | ICD-10-CM

## 2014-04-10 DIAGNOSIS — J309 Allergic rhinitis, unspecified: Secondary | ICD-10-CM

## 2014-04-10 DIAGNOSIS — J302 Other seasonal allergic rhinitis: Secondary | ICD-10-CM

## 2014-04-10 MED ORDER — AZELASTINE-FLUTICASONE 137-50 MCG/ACT NA SUSP: NASAL | Status: DC
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
## 2014-04-10 NOTE — Patient Instructions (Signed)
Sample and script  Dymista nasal spray 1-2 puffs each nostril once daily at bedtime  Taper down to prednisone 10 mg daily as able.

## 2014-04-16 DIAGNOSIS — Z6825 Body mass index (BMI) 25.0-25.9, adult: Secondary | ICD-10-CM | POA: Diagnosis not present

## 2014-04-16 DIAGNOSIS — S81809A Unspecified open wound, unspecified lower leg, initial encounter: Secondary | ICD-10-CM | POA: Diagnosis not present

## 2014-04-16 DIAGNOSIS — S91009A Unspecified open wound, unspecified ankle, initial encounter: Secondary | ICD-10-CM | POA: Diagnosis not present

## 2014-04-16 DIAGNOSIS — S81009A Unspecified open wound, unspecified knee, initial encounter: Secondary | ICD-10-CM | POA: Diagnosis not present

## 2014-04-23 DIAGNOSIS — S81009A Unspecified open wound, unspecified knee, initial encounter: Secondary | ICD-10-CM | POA: Diagnosis not present

## 2014-04-23 DIAGNOSIS — Z6825 Body mass index (BMI) 25.0-25.9, adult: Secondary | ICD-10-CM | POA: Diagnosis not present

## 2014-04-23 DIAGNOSIS — S91009A Unspecified open wound, unspecified ankle, initial encounter: Secondary | ICD-10-CM | POA: Diagnosis not present

## 2014-04-23 DIAGNOSIS — S81809A Unspecified open wound, unspecified lower leg, initial encounter: Secondary | ICD-10-CM | POA: Diagnosis not present

## 2014-04-24 ENCOUNTER — Other Ambulatory Visit: Payer: Self-pay | Admitting: Internal Medicine

## 2014-05-07 ENCOUNTER — Other Ambulatory Visit: Payer: Self-pay | Admitting: Internal Medicine

## 2014-05-07 DIAGNOSIS — S81009A Unspecified open wound, unspecified knee, initial encounter: Secondary | ICD-10-CM | POA: Diagnosis not present

## 2014-05-07 DIAGNOSIS — Z6826 Body mass index (BMI) 26.0-26.9, adult: Secondary | ICD-10-CM | POA: Diagnosis not present

## 2014-05-08 ENCOUNTER — Telehealth: Payer: Self-pay | Admitting: Internal Medicine

## 2014-05-08 MED ORDER — BUDESONIDE 0.25 MG/2ML IN SUSP: RESPIRATORY_TRACT | Status: DC

## 2014-05-08 NOTE — Telephone Encounter (Signed)
Called and spoke with Dabney at [facility] and she stated that she needed a new rx for the pulmicort that has the [place] code on the rx so this can be billed to medicare.  [facility] and nothing further is needed.

## 2014-05-14 ENCOUNTER — Ambulatory Visit: Payer: Medicare Other | Admitting: Internal Medicine

## 2014-05-14 DIAGNOSIS — Z6826 Body mass index (BMI) 26.0-26.9, adult: Secondary | ICD-10-CM | POA: Diagnosis not present

## 2014-05-14 DIAGNOSIS — S91009A Unspecified open wound, unspecified ankle, initial encounter: Secondary | ICD-10-CM | POA: Diagnosis not present

## 2014-05-14 DIAGNOSIS — J441 Chronic obstructive pulmonary disease with (acute) exacerbation: Secondary | ICD-10-CM | POA: Diagnosis not present

## 2014-05-14 DIAGNOSIS — S81009A Unspecified open wound, unspecified knee, initial encounter: Secondary | ICD-10-CM | POA: Diagnosis not present

## 2014-05-30 ENCOUNTER — Other Ambulatory Visit: Payer: Self-pay | Admitting: Internal Medicine

## 2014-05-31 ENCOUNTER — Telehealth: Payer: Self-pay | Admitting: Internal Medicine

## 2014-05-31 MED ORDER — LEVALBUTEROL HCL 0.63 MG/3ML IN NEBU: INHALATION_SOLUTION | RESPIRATORY_TRACT | Status: DC

## 2014-05-31 MED ORDER — AZELASTINE HCL 0.1 % NA SOLN: NASAL | Status: DC

## 2014-05-31 MED ORDER — FLUTICASONE PROPIONATE 50 MCG/ACT NA SUSP: NASAL | Status: DC

## 2014-05-31 NOTE — Telephone Encounter (Signed)
Called spoke with pt. Aware will send in RX for levalbuterol neb (rx sent). Pt is requesting alternative to the dymista since insurance does not cover this. Please advise thanks   Allergies  Allergen Reactions  . Amoxicillin-Pot Clavulanate     GI upset  . Daliresp [Roflumilast] Other (See Comments)    unknown  . Diltiazem Nausea Only  . Montelukast Sodium Other (See Comments)     flu-like symptoms  . Zafirlukast Other (See Comments)    unknown  . Adhesive [Tape] Rash    Please use paper tape

## 2014-05-31 NOTE — Telephone Encounter (Signed)
Replace Dymista with scripts for astelin nasal spray  # 1, 1-2 puffs each nostril once or twice daily, ref prn                                                       flonase                    #1, 1-2 puffs each nostril once daily at bedtime, ref prn

## 2014-05-31 NOTE — Telephone Encounter (Signed)
Pt aware RX's sent in. Nothing further needed

## 2014-06-07 ENCOUNTER — Telehealth: Payer: Self-pay | Admitting: Internal Medicine

## 2014-06-07 MED ORDER — AZITHROMYCIN 250 MG PO TABS: ORAL_TABLET | ORAL | Status: DC

## 2014-06-07 MED ORDER — PREDNISONE 10 MG PO TABS: ORAL_TABLET | ORAL | Status: DC

## 2014-06-07 NOTE — Telephone Encounter (Signed)
She usually has prednisone and knows how to taper from 40 mg. Suggest she try that and offer a Zor whatever antibiotic seems to work best.

## 2014-06-07 NOTE — Telephone Encounter (Signed)
Called spoke with pt. She is asking for prednisone to be called in along with ZPAK. RX sent in. Nothing further needed

## 2014-06-07 NOTE — Telephone Encounter (Signed)
lmomtcb x1 

## 2014-06-07 NOTE — Telephone Encounter (Signed)
Pt returned call.  Spoke with patient who reports wheezing, tightness in chest, some increased SOB x1 week.  Produced yellow mucus this morning.  Denies any f/c/s, n/v/d, hemoptysis, PND, leg swelling.  Dr Annamaria Boots please advise, thank you.  Last ov 7.21.15 w/ [facility] [place] Allergies  Allergen Reactions  . Amoxicillin-Pot Clavulanate     GI upset  . Daliresp [Roflumilast] Other (See Comments)    unknown  . Diltiazem Nausea Only  . Montelukast Sodium Other (See Comments)     flu-like symptoms  . Zafirlukast Other (See Comments)    unknown  . Adhesive [Tape] Rash    Please use paper tape

## 2014-06-22 ENCOUNTER — Other Ambulatory Visit: Payer: Self-pay | Admitting: *Deleted

## 2014-06-22 DIAGNOSIS — I10 Essential (primary) hypertension: Secondary | ICD-10-CM

## 2014-06-22 MED ORDER — LOSARTAN POTASSIUM 25 MG PO TABS: 25.0000 mg | ORAL_TABLET | Freq: Every day | ORAL | Status: DC

## 2014-06-26 ENCOUNTER — Telehealth: Payer: Self-pay | Admitting: Internal Medicine

## 2014-06-26 NOTE — Telephone Encounter (Signed)
Called and spoke with [name] at [facility] Requesting ICD-10 code for Rx; Rx faxed with ICD-9 code 493.2 Code given to pharmacist--J44.1 Nothing further needed.

## 2014-06-28 DIAGNOSIS — Z23 Encounter for immunization: Secondary | ICD-10-CM | POA: Diagnosis not present

## 2014-07-09 ENCOUNTER — Telehealth: Payer: Self-pay | Admitting: Internal Medicine

## 2014-07-09 MED ORDER — PREDNISONE 10 MG PO TABS: ORAL_TABLET | ORAL | Status: DC

## 2014-07-09 NOTE — Telephone Encounter (Signed)
Spoke with pt - Discussed below recs per Dr. Annamaria Boots.  She verbalized understanding, is aware [facility] [place], and voiced no further questions or concerns at this time.  She is to call office back if symptoms do not improve or worsen.

## 2014-07-09 NOTE — Telephone Encounter (Signed)
  Suggest she try a prednisone taper- 10 mg, # 20, 4 X 2 DAYS, 3 X 2 DAYS, 2 X 2 DAYS, 1 X 2 DAYS

## 2014-07-09 NOTE — Telephone Encounter (Signed)
Called and spoke to pt. Pt c/o sneezing, rhinorrhea with clear mucus, dry cough, head pressure, SOB and intermittent chest tightness. Pt taking astelin and flonase and pt states it is helping a little. Pt states she is not sleeping well d/t her sneezing. Pt last seen on 04/10/14. Pt requesting recs by CY.   CY please advise.  Allergies  Allergen Reactions  . Amoxicillin-Pot Clavulanate     GI upset  . Daliresp [Roflumilast] Other (See Comments)    unknown  . Diltiazem Nausea Only  . Montelukast Sodium Other (See Comments)     flu-like symptoms  . Zafirlukast Other (See Comments)    unknown  . Adhesive [Tape] Rash    Please use paper tape   Current Outpatient Prescriptions on File Prior to Visit  Medication Sig Dispense Refill  . albuterol (PROVENTIL HFA;VENTOLIN HFA) 108 (90 BASE) MCG/ACT inhaler Inhale 2 puffs into the lungs 2 (two) times daily as needed for wheezing or shortness of breath (copd/asthma).       . ALPRAZolam (XANAX) 0.25 MG tablet Take 0.25 mg by mouth daily.      Marland Kitchen azelastine (ASTELIN) 0.1 % nasal spray 1-2 puffs each nostril once or twice daily,  30 mL  prn  . Azelastine-Fluticasone (DYMISTA) 137-50 MCG/ACT SUSP 1-2 puffs each nostril once daily at bedtime  1 Bottle  prn  . Azelastine-Fluticasone (DYMISTA) 137-50 MCG/ACT SUSP 1-2 sprays in each nostril once daily at bedtime  1 Bottle  0  . azithromycin (ZITHROMAX) 250 MG tablet Take as directed  6 tablet  0  . budesonide (PULMICORT) 0.25 MG/2ML nebulizer solution USE 1 VIAL IN NEBULIZER TWICE A DAY  360 mL  3  . Flunisolide HFA (AEROSPAN) 80 MCG/ACT AERS 2 puffs then rinse mouth twice daily until gone  1 Inhaler  0  . fluticasone (FLONASE) 50 MCG/ACT nasal spray 1-2 puffs each nostril once or twice daily  16 g  prn  . guaiFENesin (MUCINEX) 600 MG 12 hr tablet Take 600 mg by mouth daily.      . hydrochlorothiazide 25 MG tablet Take 25 mg by mouth daily.       Marland Kitchen ipratropium (ATROVENT) 0.02 % nebulizer solution USE 1 VIAL  IN NEBULIZER THREE TIMES A DAY DX 493.20  187.5 mL  6  . levalbuterol (XOPENEX) 0.63 MG/3ML nebulizer solution USE 3 MLS BY NEBULIZATIONEVERY 6 HOURS AS NEEDED FOR SHORTNESS OF BREATH/WHEEZING dx 493.20  360 mL  5  . losartan (COZAAR) 25 MG tablet Take 1 tablet (25 mg total) by mouth daily.  30 tablet  1  . omeprazole (PRILOSEC) 20 MG capsule Take 20 mg by mouth 2 (two) times daily before a meal.       . oxyCODONE-acetaminophen (PERCOCET) 7.5-325 MG per tablet Take 1 tablet by mouth at bedtime.       Marland Kitchen PERFOROMIST 20 MCG/2ML nebulizer solution USE 1 VIAL IN NEBULIZER TWICE A DAY  120 mL  12  . predniSONE (DELTASONE) 10 MG tablet Take 10 mg by mouth daily with breakfast. Continuous course      . predniSONE (DELTASONE) 10 MG tablet TAKE 1 TABLET BY MOUTH AS DIRECTED  100 tablet  1  . predniSONE (DELTASONE) 10 MG tablet Take 4 tabs daily x 2 days, 3 tabs daily x 2 days, 2 tabs daily x 2 days, 1 tab daily x 2 days  20 tablet  0  . simvastatin (ZOCOR) 20 MG tablet Take 20 mg by mouth daily.       . [  DISCONTINUED] metoprolol tartrate (LOPRESSOR) 25 MG tablet Take 1 tablet (25 mg total) by mouth 2 (two) times daily.  30 tablet  0  . [DISCONTINUED] pantoprazole (PROTONIX) 40 MG tablet Take 1 tablet (40 mg total) by mouth daily at 12 noon.  10 tablet  0   No current facility-administered medications on file prior to visit.

## 2014-07-13 ENCOUNTER — Telehealth: Payer: Self-pay | Admitting: Internal Medicine

## 2014-07-16 DIAGNOSIS — R7309 Other abnormal glucose: Secondary | ICD-10-CM | POA: Diagnosis not present

## 2014-07-16 DIAGNOSIS — Z6826 Body mass index (BMI) 26.0-26.9, adult: Secondary | ICD-10-CM | POA: Diagnosis not present

## 2014-07-16 DIAGNOSIS — J449 Chronic obstructive pulmonary disease, unspecified: Secondary | ICD-10-CM | POA: Diagnosis not present

## 2014-07-16 DIAGNOSIS — E785 Hyperlipidemia, unspecified: Secondary | ICD-10-CM | POA: Diagnosis not present

## 2014-07-16 DIAGNOSIS — I10 Essential (primary) hypertension: Secondary | ICD-10-CM | POA: Diagnosis not present

## 2014-07-16 DIAGNOSIS — G35 Multiple sclerosis: Secondary | ICD-10-CM | POA: Diagnosis not present

## 2014-07-16 NOTE — Telephone Encounter (Signed)
Called and spoke with pt and she stated that she did increase the prednisone as directed by CY last week.    She stated that she has to go to Dr. Blanch Media office today for labs and she will see Dr. Henrene Pastor today and he will see her.  She stated that she has been having a lot of yellow drainage in her throat.  She is to call back if anything further is needed.

## 2014-07-22 NOTE — Assessment & Plan Note (Signed)
Typical asthmatic exacerbation for her with wheeze and rhinorrhea. Nonspecific for allergy versus viral infection at this time Plan-discussed steroid taper back to 10 mg daily

## 2014-07-22 NOTE — Assessment & Plan Note (Signed)
Increased congestion and drainage. Nonspecific at this time of year. Plan-sample Dymista

## 2014-08-07 ENCOUNTER — Ambulatory Visit (INDEPENDENT_AMBULATORY_CARE_PROVIDER_SITE_OTHER): Payer: Medicare Other | Admitting: Cardiovascular Disease

## 2014-08-07 ENCOUNTER — Encounter: Payer: Self-pay | Admitting: Cardiovascular Disease

## 2014-08-07 VITALS — BP 160/80 | HR 120 | Ht 60.0 in | Wt 145.8 lb

## 2014-08-07 DIAGNOSIS — R0602 Shortness of breath: Secondary | ICD-10-CM | POA: Diagnosis not present

## 2014-08-07 DIAGNOSIS — R000 Tachycardia, unspecified: Secondary | ICD-10-CM | POA: Diagnosis not present

## 2014-08-07 DIAGNOSIS — I480 Paroxysmal atrial fibrillation: Secondary | ICD-10-CM

## 2014-08-07 DIAGNOSIS — E785 Hyperlipidemia, unspecified: Secondary | ICD-10-CM | POA: Diagnosis not present

## 2014-08-07 DIAGNOSIS — I10 Essential (primary) hypertension: Secondary | ICD-10-CM | POA: Diagnosis not present

## 2014-08-07 MED ORDER — NEBIVOLOL HCL 5 MG PO TABS: 5.0000 mg | ORAL_TABLET | Freq: Every day | ORAL | Status: DC

## 2014-08-07 NOTE — Patient Instructions (Addendum)
Your physician wants you to follow-up in:    [name] will receive a reminder letter in the mail two months in advance. If you don't receive a letter, please call our office to schedule the follow-up appointment. Your physician has recommended you make the following change in your medication:  BYSTOLIC 5 MG 1 TAB  EVERY DAY

## 2014-08-07 NOTE — Assessment & Plan Note (Signed)
Maint NSR Does not note sinus tachycardia likely related to URI and recent infection   Add low dose bystolic 5 mg to curb HR

## 2014-08-07 NOTE — Assessment & Plan Note (Signed)
Well controlled.  Continue current medications and low sodium Dash type diet.    

## 2014-08-07 NOTE — Assessment & Plan Note (Signed)
Mostly sinus and upper airway no active wheezing today continue inhaler , antibiotics done  F/u Dr Annamaria Boots Monday

## 2014-08-07 NOTE — Assessment & Plan Note (Signed)
Cholesterol is at goal.  Continue current dose of statin and diet Rx.  No myalgias or side effects.  F/U  LFT's in 6 months. Lab Results  Component Value Date   LDLCALC * 07/30/2007    127        Total Cholesterol/HDL:CHD Risk Coronary Heart Disease Risk Table                     Men   Women  1/2 Average Risk   3.4   3.3  Labs with Dr Dahlia Client

## 2014-08-07 NOTE — Progress Notes (Signed)
Patient ID: [name], female   DOB: 1933/02/28, 80 y.o.   MRN: [id number] 80 yo with sig. COPD sees Dr Annamaria Boots. History of mild AS and PAF. Having issues with asthma and allergies. Dyspnea chronic from lung disease. No chest pain or syncope.   Echo 8/14  Study Conclusions  - Left ventricle: The cavity size was moderately dilated. Wall thickness was normal. Systolic function was severely reduced. The estimated ejection fraction was in the range of 25% to 30%. Diffuse hypokinesis. - Aortic valve: Moderately calcified with likely moderate AS given degree of LV dysfunction - Mitral valve: Mild regurgitation. - Left atrium: The atrium was mildly dilated. - Atrial septum: No defect or patent foramen ovale was identified.  Venous duplex 8/14 no DVT  Cath 07/13/13 by Dr Burt Knack no significant AS and no CAD  Left mainstem: No stenosis, arises from left cusp.  Left anterior descending (LAD): Patent to the distal anterior wall. No significant stenosis throughout  Left circumflex (LCx): Widely patent. OM2 and OM3 are the major branches and they have no stenosis.  Right coronary artery (RCA): normal caliber vessel with mild 20-30% mid-vessel stenosis. PDA is large without significant disease.  Left ventriculography: Left ventricular systolic function is moderately depressed (global). The LVEF is estimated at 35-40%.  Final Conclusions:  1. Minimal nonobstructive CAD  2. No significant aortic stenosis (no transvalvular gradient with simultaneous pressures)  3. Moderately severe global LV dysfunction  Recommendations: med Rx for cardiomyopathy.   Right heart pressures ok with no pulmonary hypertension  Hemodynamics  RA 9  RV 36/9  PA 40/14 mean 27  PCWP 17  LV 122/11  AO 122/74 mean 89  Oxygen saturations:  PA 63  AO 91  Cardiac Output (Fick) 4.4  Cardiac Index (Fick) 2.63   She is having back problems again Seeing [facility] She has history of ruptured disc.  Has  oxygen dependant COPD Sees [facility] Not a great candidate for general anesthesia base on this   Has been fighting URI last 3-4  Weeks  Seen by Dr Dahlia Client and given 10 day course of cefuroxime and steroids.  F/U  Dr Annamaria Boots Monday Considerable post nasal drip No fever.         ROS: Denies fever, malais, weight loss, blurry vision, decreased visual acuity, cough, sputum, SOB, hemoptysis, pleuritic pain, palpitaitons, heartburn, abdominal pain, melena, lower extremity edema, claudication, or rash.  All other systems reviewed and negative  General: Affect appropriate Healthy:  appears stated age 45: normal Neck supple with no adenopathy JVP normal no bruits no thyromegaly Lungs clear with no wheezing and good diaphragmatic motion Heart:  S1/S2 no murmur, no rub, gallop or click PMI normal Abdomen: benighn, BS positve, no tenderness, no AAA no bruit.  No HSM or HJR Distal pulses intact with no bruits No edema Neuro non-focal Skin warm and dry No muscular weakness   Current Outpatient Prescriptions  Medication Sig Dispense Refill  . albuterol (PROVENTIL HFA;VENTOLIN HFA) 108 (90 BASE) MCG/ACT inhaler Inhale 2 puffs into the lungs 2 (two) times daily as needed for wheezing or shortness of breath (copd/asthma).     . ALPRAZolam (XANAX) 0.25 MG tablet Take 0.25 mg by mouth daily.    Marland Kitchen azelastine (ASTELIN) 0.1 % nasal spray 1-2 puffs each nostril once or twice daily, 30 mL prn  . Azelastine-Fluticasone (DYMISTA) 137-50 MCG/ACT SUSP 1-2 puffs each nostril once daily at bedtime 1 Bottle prn  . Azelastine-Fluticasone (DYMISTA) 137-50 MCG/ACT SUSP 1-2 sprays in  each nostril once daily at bedtime 1 Bottle 0  . azithromycin (ZITHROMAX) 250 MG tablet Take as directed 6 tablet 0  . budesonide (PULMICORT) 0.25 MG/2ML nebulizer solution USE 1 VIAL IN NEBULIZER TWICE A DAY 360 mL 3  . Flunisolide HFA (AEROSPAN) 80 MCG/ACT AERS 2 puffs then rinse mouth twice daily until gone 1 Inhaler 0  .  fluticasone (FLONASE) 50 MCG/ACT nasal spray 1-2 puffs each nostril once or twice daily 16 g prn  . guaiFENesin (MUCINEX) 600 MG 12 hr tablet Take 600 mg by mouth daily.    . hydrochlorothiazide 25 MG tablet Take 25 mg by mouth daily.     Marland Kitchen ipratropium (ATROVENT) 0.02 % nebulizer solution USE 1 VIAL IN NEBULIZER THREE TIMES A DAY DX 493.20 187.5 mL 6  . levalbuterol (XOPENEX) 0.63 MG/3ML nebulizer solution USE 3 MLS BY NEBULIZATIONEVERY 6 HOURS AS NEEDED FOR SHORTNESS OF BREATH/WHEEZING dx 493.20 360 mL 5  . losartan (COZAAR) 25 MG tablet Take 1 tablet (25 mg total) by mouth daily. 30 tablet 1  . omeprazole (PRILOSEC) 20 MG capsule Take 20 mg by mouth 2 (two) times daily before a meal.     . oxyCODONE-acetaminophen (PERCOCET) 7.5-325 MG per tablet Take 1 tablet by mouth at bedtime.     Marland Kitchen PERFOROMIST 20 MCG/2ML nebulizer solution USE 1 VIAL IN NEBULIZER TWICE A DAY 120 mL 12  . predniSONE (DELTASONE) 10 MG tablet Take 10 mg by mouth daily with breakfast. Continuous course    . predniSONE (DELTASONE) 10 MG tablet TAKE 1 TABLET BY MOUTH AS DIRECTED 100 tablet 1  . predniSONE (DELTASONE) 10 MG tablet Take 4 tabs daily x 2 days, 3 tabs daily x 2 days, 2 tabs daily x 2 days, 1 tab daily x 2 days 20 tablet 0  . predniSONE (DELTASONE) 10 MG tablet Take 4 X 2 DAYS, 3 X 2 DAYS, 2 X 2 DAYS, 1 X 2 DAYS 20 tablet 0  . simvastatin (ZOCOR) 20 MG tablet Take 20 mg by mouth daily.     . [DISCONTINUED] metoprolol tartrate (LOPRESSOR) 25 MG tablet Take 1 tablet (25 mg total) by mouth 2 (two) times daily. 30 tablet 0  . [DISCONTINUED] pantoprazole (PROTONIX) 40 MG tablet Take 1 tablet (40 mg total) by mouth daily at 12 noon. 10 tablet 0   No current facility-administered medications for this visit.    Allergies  Amoxicillin-pot clavulanate; Daliresp; Diltiazem; Montelukast sodium; Zafirlukast; and Adhesive  Electrocardiogram:  07/15/13  SR PVC low voltage nonspecific ST/T wave changes   Today ST rate 120 RAD no  afib poor R wave progression Rate up compared to 07/15/13   Assessment and Plan

## 2014-08-08 ENCOUNTER — Telehealth: Payer: Self-pay | Admitting: *Deleted

## 2014-08-08 NOTE — Telephone Encounter (Signed)
[facility] in [place] called and stated that the patients insurance requires a prior authorization for the bystolic. Thanks, MI

## 2014-08-10 ENCOUNTER — Telehealth: Payer: Self-pay | Admitting: Cardiovascular Disease

## 2014-08-10 NOTE — Telephone Encounter (Signed)
PRIOR  AUTH  DONE PT  MAY  HAVE  MED  THRU  December  OF  NEXT YEAR  UNABLE  TO  NOTIFY PHARMACY  WILL CALL  PT ./CY PT  AWARE .Adonis Housekeeper

## 2014-08-10 NOTE — Telephone Encounter (Signed)
New problem   Pt has question about a medication that is very expensive, she doesn't know name of medication. Pt need another brand

## 2014-08-13 ENCOUNTER — Encounter: Payer: Self-pay | Admitting: Internal Medicine

## 2014-08-13 ENCOUNTER — Ambulatory Visit (INDEPENDENT_AMBULATORY_CARE_PROVIDER_SITE_OTHER): Payer: Medicare Other | Admitting: Internal Medicine

## 2014-08-13 VITALS — BP 122/74 | HR 108 | Ht 62.0 in | Wt 146.2 lb

## 2014-08-13 DIAGNOSIS — I480 Paroxysmal atrial fibrillation: Secondary | ICD-10-CM | POA: Diagnosis not present

## 2014-08-13 DIAGNOSIS — J449 Chronic obstructive pulmonary disease, unspecified: Secondary | ICD-10-CM | POA: Diagnosis not present

## 2014-08-13 DIAGNOSIS — I350 Nonrheumatic aortic (valve) stenosis: Secondary | ICD-10-CM | POA: Diagnosis not present

## 2014-08-13 DIAGNOSIS — B370 Candidal stomatitis: Secondary | ICD-10-CM | POA: Diagnosis not present

## 2014-08-13 DIAGNOSIS — J4489 Other specified chronic obstructive pulmonary disease: Secondary | ICD-10-CM

## 2014-08-13 MED ORDER — PREDNISONE 10 MG PO TABS: 10.0000 mg | ORAL_TABLET | Freq: Every day | ORAL | Status: DC

## 2014-08-13 MED ORDER — FLUCONAZOLE 150 MG PO TABS: 150.0000 mg | ORAL_TABLET | Freq: Every day | ORAL | Status: DC

## 2014-08-13 NOTE — Patient Instructions (Signed)
We can continue prednisone 10 mg daily  Since cost is a concern for you, I can contact Dr Johnsie Cancel and see if metoprolol would be an option instead of the Bystolic, for slowing your heart rate.   Script for Diflucan to clear the yeast "thrush" from your throat

## 2014-08-13 NOTE — Telephone Encounter (Signed)
LMTCB ./CY 

## 2014-08-13 NOTE — Telephone Encounter (Signed)
PT  CANNOT  AFFORD

## 2014-08-13 NOTE — Telephone Encounter (Signed)
PT RTN [place], [place]
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
## 2014-08-14 MED ORDER — CARVEDILOL 3.125 MG PO TABS: 3.1250 mg | ORAL_TABLET | Freq: Two times a day (BID) | ORAL | Status: DC

## 2014-08-14 NOTE — Telephone Encounter (Signed)
PT  CANNOT AFFORD  BYSTOLIC  NEEDS  SOMETHING  CHEAPER  WILL FORWARD   TO  DR  Johnsie Cancel FOR  RECOMMENDATIONS./CY

## 2014-08-14 NOTE — Telephone Encounter (Signed)
PT AWARE OF NEW MED .[name]

## 2014-08-14 NOTE — Telephone Encounter (Signed)
PER  DR NISHAN   START  CARVEDILOL 3.125 MG  TWICE  DAILY ./CY

## 2014-08-14 NOTE — Telephone Encounter (Signed)
Follow up ° ° ° ° ° °Returning Christine's call °

## 2014-08-18 DIAGNOSIS — B370 Candidal stomatitis: Secondary | ICD-10-CM | POA: Insufficient documentation

## 2014-08-18 NOTE — Assessment & Plan Note (Signed)
Known risk with antibiotics and steroids Plan-emphasis on mouth care. Diflucan

## 2014-08-18 NOTE — Assessment & Plan Note (Signed)
Currently controlled but with demonstrated potential for marked exacerbation. We are trying maintenance steroid and have discussed steroid side effects carefully.

## 2014-08-18 NOTE — Assessment & Plan Note (Signed)
Cardiology works to balance control of heart rate and blood pressure while avoiding increased bronchospasm. I discussed possible alternative medications including metoprolol and Cardizem that might be cheaper than Bystolic.

## 2014-08-18 NOTE — Assessment & Plan Note (Signed)
I don't usually hear murmur, but recognize any impediment adds to her dyspnea with exertion

## 2014-08-20 ENCOUNTER — Telehealth: Payer: Self-pay | Admitting: Cardiovascular Disease

## 2014-08-20 ENCOUNTER — Telehealth: Payer: Self-pay | Admitting: Internal Medicine

## 2014-08-20 MED ORDER — ATENOLOL 25 MG PO TABS: 25.0000 mg | ORAL_TABLET | Freq: Every day | ORAL | Status: DC

## 2014-08-20 NOTE — Telephone Encounter (Signed)
PER PT PHARMACISTS  RECOMMENDED  ATENOLOL  FOR PT  TO TAKE   WITH   ASTHMA   WILL FORWARD  TO  DR Johnsie Cancel FOR  REVIEW  .Adonis Housekeeper

## 2014-08-20 NOTE — Telephone Encounter (Signed)
Spoke with pt, was put on carvedilol on 11/24, states that since starting med she has had increased wheezing, fatigue, "asthma flare-up" per pt.  She stopped taking med yesterday after talking to her pharmacist.  Is requesting recs.  Last ov 11/23 Next ov 12/12/14  Allergies  Allergen Reactions  . Amoxicillin-Pot Clavulanate     GI upset  . Daliresp [Roflumilast] Other (See Comments)    unknown  . Diltiazem Nausea Only  . Montelukast Sodium Other (See Comments)     flu-like symptoms  . Zafirlukast Other (See Comments)    unknown  . Adhesive [Tape] Rash    Please use paper tape   Current Outpatient Prescriptions on File Prior to Visit  Medication Sig Dispense Refill  . albuterol (PROVENTIL HFA;VENTOLIN HFA) 108 (90 BASE) MCG/ACT inhaler Inhale 2 puffs into the lungs 2 (two) times daily as needed for wheezing or shortness of breath (copd/asthma).     . ALPRAZolam (XANAX) 0.25 MG tablet Take 0.25 mg by mouth daily.    Marland Kitchen azelastine (ASTELIN) 0.1 % nasal spray 1-2 puffs each nostril once or twice daily, 30 mL prn  . budesonide (PULMICORT) 0.25 MG/2ML nebulizer solution USE 1 VIAL IN NEBULIZER TWICE A DAY 360 mL 3  . carvedilol (COREG) 3.125 MG tablet Take 1 tablet (3.125 mg total) by mouth 2 (two) times daily. 60 tablet 11  . fluconazole (DIFLUCAN) 150 MG tablet Take 1 tablet (150 mg total) by mouth daily. 2 tablet 0  . guaiFENesin (MUCINEX) 600 MG 12 hr tablet Take 600 mg by mouth daily.    . hydrochlorothiazide 25 MG tablet Take 25 mg by mouth daily.     Marland Kitchen ipratropium (ATROVENT) 0.02 % nebulizer solution USE 1 VIAL IN NEBULIZER THREE TIMES A DAY DX 493.20 187.5 mL 6  . levalbuterol (XOPENEX) 0.63 MG/3ML nebulizer solution USE 3 MLS BY NEBULIZATIONEVERY 6 HOURS AS NEEDED FOR SHORTNESS OF BREATH/WHEEZING dx 493.20 360 mL 5  . losartan (COZAAR) 25 MG tablet Take 1 tablet (25 mg total) by mouth daily. 30 tablet 1  . omeprazole (PRILOSEC) 20 MG capsule Take 20 mg by mouth 2 (two) times daily  before a meal.     . PERFOROMIST 20 MCG/2ML nebulizer solution USE 1 VIAL IN NEBULIZER TWICE A DAY 120 mL 12  . predniSONE (DELTASONE) 10 MG tablet Take 1 tablet (10 mg total) by mouth daily with breakfast. Or as directed 100 tablet 0  . simvastatin (ZOCOR) 20 MG tablet Take 20 mg by mouth daily.     . [DISCONTINUED] metoprolol tartrate (LOPRESSOR) 25 MG tablet Take 1 tablet (25 mg total) by mouth 2 (two) times daily. 30 tablet 0  . [DISCONTINUED] pantoprazole (PROTONIX) 40 MG tablet Take 1 tablet (40 mg total) by mouth daily at 12 noon. 10 tablet 0   No current facility-administered medications on file prior to visit.

## 2014-08-20 NOTE — Telephone Encounter (Signed)
New problem   Pt need to speak to nurse concerning her having an allergic reaction from Carvedilol 3.125mg . Pt stated it may be affecting her asthma. Please call pt.

## 2014-08-20 NOTE — Telephone Encounter (Signed)
New Msg   Patient pharmacy [facility] calling to request a new prescription to be sent in for patient. Patient has been prescribed Carvedilol 3.125 mg taken twice a day but she has asthma and this med can cause an interaction. [facility] on [address] can be reached at [phone] please ask for  [name] or [name].

## 2014-08-20 NOTE — Telephone Encounter (Signed)
SCRIPT  PHONED IN  PT  AWARE ./CY

## 2014-08-20 NOTE — Telephone Encounter (Signed)
She has been on metoprolol in past and should tolerate coreg

## 2014-08-20 NOTE — Telephone Encounter (Signed)
ALSO  PT  HAS  NOT TAKEN  2  DOSES  AND FEELS  SOMEWHAT BETTER./CY

## 2014-08-20 NOTE — Telephone Encounter (Signed)
WILL FORWARD TO DR NISHAN FOR  REVIEW./CY 

## 2014-08-20 NOTE — Telephone Encounter (Signed)
New Message  Pt calling back Altha Harm to answer question about [facility], please call back and advise

## 2014-08-20 NOTE — Telephone Encounter (Signed)
PER  PT  IS  SOB SINCE  STARTING  CARVEDILOL ALSO VERY  FATIGUE  WILL CHECK ON  PRICE  OF  BYSTOLIC AND  WILL CALL BACK .Adonis Housekeeper

## 2014-08-20 NOTE — Telephone Encounter (Signed)
Atenolol 25 mg would be fine

## 2014-08-20 NOTE — Telephone Encounter (Signed)
Unless she now has some other reason to wheeze, like a cold, any problems caused by carvedilol should clear over next few days. I really getting uncomfortable, would go up on prednisone to 60 mg daily, as before, and taper back gradually.

## 2014-08-20 NOTE — Telephone Encounter (Signed)
Called and spoke with pt and she is aware of CY recs.  She is aware and nothing further is needed.

## 2014-08-21 ENCOUNTER — Other Ambulatory Visit: Payer: Self-pay | Admitting: Cardiovascular Disease

## 2014-08-30 ENCOUNTER — Encounter (HOSPITAL_COMMUNITY): Payer: Self-pay | Admitting: Cardiovascular Disease

## 2014-09-20 ENCOUNTER — Telehealth: Payer: Self-pay | Admitting: Internal Medicine

## 2014-09-20 MED ORDER — FLUCONAZOLE 150 MG PO TABS: 150.0000 mg | ORAL_TABLET | Freq: Every day | ORAL | Status: DC

## 2014-09-20 NOTE — Addendum Note (Signed)
Addended by: Raymondo Band D on: 09/20/2014 12:48 PM   Modules accepted: Orders

## 2014-09-20 NOTE — Telephone Encounter (Signed)
When she's tight she needs to increase prednisone to 60 mg x 2 days, then taper every 2 day.  Ok to refill prednisone if needed Also Rx diflucan 150 mg, # 7, 1 daily

## 2014-09-20 NOTE — Telephone Encounter (Signed)
Called and spoke with pt and she stated that she has been sneezing and coughing and having yellow sputum and her mouth is yellow.  She stated that she has thrush and she has increased her prednisone for the last couple of days but this has not helped with her breathinig.  Pt is requesting recs from CY.  Please advise thanks.    Last ov--08/13/14 Next ov--12/12/14  Allergies  Allergen Reactions  . Amoxicillin-Pot Clavulanate     GI upset  . Daliresp [Roflumilast] Other (See Comments)    unknown  . Diltiazem Nausea Only  . Montelukast Sodium Other (See Comments)     flu-like symptoms  . Zafirlukast Other (See Comments)    unknown  . Adhesive [Tape] Rash    Please use paper tape    Current Outpatient Prescriptions on File Prior to Visit  Medication Sig Dispense Refill  . albuterol (PROVENTIL HFA;VENTOLIN HFA) 108 (90 BASE) MCG/ACT inhaler Inhale 2 puffs into the lungs 2 (two) times daily as needed for wheezing or shortness of breath (copd/asthma).     . ALPRAZolam (XANAX) 0.25 MG tablet Take 0.25 mg by mouth daily.    Marland Kitchen atenolol (TENORMIN) 25 MG tablet Take 1 tablet (25 mg total) by mouth daily. 30 tablet 11  . azelastine (ASTELIN) 0.1 % nasal spray 1-2 puffs each nostril once or twice daily, 30 mL prn  . budesonide (PULMICORT) 0.25 MG/2ML nebulizer solution USE 1 VIAL IN NEBULIZER TWICE A DAY 360 mL 3  . fluconazole (DIFLUCAN) 150 MG tablet Take 1 tablet (150 mg total) by mouth daily. 2 tablet 0  . guaiFENesin (MUCINEX) 600 MG 12 hr tablet Take 600 mg by mouth daily.    . hydrochlorothiazide 25 MG tablet Take 25 mg by mouth daily.     Marland Kitchen ipratropium (ATROVENT) 0.02 % nebulizer solution USE 1 VIAL IN NEBULIZER THREE TIMES A DAY DX 493.20 187.5 mL 6  . levalbuterol (XOPENEX) 0.63 MG/3ML nebulizer solution USE 3 MLS BY NEBULIZATIONEVERY 6 HOURS AS NEEDED FOR SHORTNESS OF BREATH/WHEEZING dx 493.20 360 mL 5  . losartan (COZAAR) 25 MG tablet TAKE 1 TABLET (25 MG TOTAL) BY MOUTH DAILY. 30 tablet  1  . omeprazole (PRILOSEC) 20 MG capsule Take 20 mg by mouth 2 (two) times daily before a meal.     . PERFOROMIST 20 MCG/2ML nebulizer solution USE 1 VIAL IN NEBULIZER TWICE A DAY 120 mL 12  . predniSONE (DELTASONE) 10 MG tablet Take 1 tablet (10 mg total) by mouth daily with breakfast. Or as directed 100 tablet 0  . simvastatin (ZOCOR) 20 MG tablet Take 20 mg by mouth daily.     . [DISCONTINUED] metoprolol tartrate (LOPRESSOR) 25 MG tablet Take 1 tablet (25 mg total) by mouth 2 (two) times daily. 30 tablet 0  . [DISCONTINUED] pantoprazole (PROTONIX) 40 MG tablet Take 1 tablet (40 mg total) by mouth daily at 12 noon. 10 tablet 0   No current facility-administered medications on file prior to visit.

## 2014-09-20 NOTE — Telephone Encounter (Signed)
Called, spoke with pt.  Discussed below recs per Dr. Annamaria Boots.  She verbalized understanding, states she does not need pred rx at this time, and is aware [facility]  Pt verbalized understanding of instructions, is in agreement with plan, and is to call office back if symptoms do not improve or worsen and to seek emergency care if needed.

## 2014-09-23 ENCOUNTER — Other Ambulatory Visit: Payer: Self-pay | Admitting: Internal Medicine

## 2014-09-24 ENCOUNTER — Other Ambulatory Visit: Payer: Self-pay | Admitting: *Deleted

## 2014-09-24 MED ORDER — IPRATROPIUM BROMIDE 0.02 % IN SOLN: RESPIRATORY_TRACT | Status: DC

## 2014-09-26 ENCOUNTER — Telehealth: Payer: Self-pay | Admitting: Internal Medicine

## 2014-09-26 MED ORDER — PREDNISONE 10 MG PO TABS: 10.0000 mg | ORAL_TABLET | Freq: Every day | ORAL | Status: DC

## 2014-09-26 NOTE — Telephone Encounter (Signed)
Pt last seen by CY 11.23.15 Called spoke with patient who is requesting a refill on her prednisone 10mg  QD Per the last ov, she is to continue this medication  Advised pt will send rx to [facility] Will sign off

## 2014-09-27 DIAGNOSIS — J441 Chronic obstructive pulmonary disease with (acute) exacerbation: Secondary | ICD-10-CM | POA: Diagnosis not present

## 2014-09-27 DIAGNOSIS — I499 Cardiac arrhythmia, unspecified: Secondary | ICD-10-CM | POA: Diagnosis not present

## 2014-09-27 DIAGNOSIS — Z6826 Body mass index (BMI) 26.0-26.9, adult: Secondary | ICD-10-CM | POA: Diagnosis not present

## 2014-10-02 DIAGNOSIS — L570 Actinic keratosis: Secondary | ICD-10-CM | POA: Diagnosis not present

## 2014-10-02 DIAGNOSIS — Z6825 Body mass index (BMI) 25.0-25.9, adult: Secondary | ICD-10-CM | POA: Diagnosis not present

## 2014-10-02 DIAGNOSIS — L819 Disorder of pigmentation, unspecified: Secondary | ICD-10-CM | POA: Diagnosis not present

## 2014-10-22 ENCOUNTER — Telehealth: Payer: Self-pay | Admitting: Internal Medicine

## 2014-10-22 ENCOUNTER — Other Ambulatory Visit: Payer: Self-pay | Admitting: Cardiovascular Disease

## 2014-10-22 MED ORDER — PREDNISONE 20 MG PO TABS: ORAL_TABLET | ORAL | Status: DC

## 2014-10-22 NOTE — Telephone Encounter (Signed)
Rx sent for Prednisone 20 mg #50 3 daily until clearing, then 20 mg x 4 days, then back to usual daily dose no refills. Pt is aware as well. Nothing more needed at this time.

## 2014-10-22 NOTE — Telephone Encounter (Signed)
Usually we have to have her take prednisone 60 mg daily until her exacerbation of COPD breaks Offer prednisone 20 mg, # 50    3 daily until clearing, then 20 mg x 4 days, then 20 mg x 4 days, then back to usual daily dose

## 2014-10-22 NOTE — Telephone Encounter (Signed)
Spoke with pt. Reports having increased SOB and wheezing x2 days. Denies coughing or chest tightness. Has taking prednisone, nebs and albuterol HFA with minimal relief. Would like something called in.  Allergies  Allergen Reactions  . Amoxicillin-Pot Clavulanate     GI upset  . Daliresp [Roflumilast] Other (See Comments)    unknown  . Diltiazem Nausea Only  . Montelukast Sodium Other (See Comments)     flu-like symptoms  . Zafirlukast Other (See Comments)    unknown  . Adhesive [Tape] Rash    Please use paper tape    CY - please advise. Thanks.

## 2014-11-06 NOTE — Progress Notes (Signed)
Patient ID: [name], female   DOB: 14-Sep-1933, 81 y.o.   MRN: [id number] 81 y.o.  with sig. COPD sees Dr Annamaria Boots. History of mild AS  and PAF. Having issues with asthma and allergies. Dyspnea chronic from lung disease. No chest pain or syncope.   Echo 8/14  Study Conclusions  - Left ventricle: The cavity size was moderately dilated. Wall thickness was normal. Systolic function was severely reduced. The estimated ejection fraction was in the range of 25% to 30%. Diffuse hypokinesis. - Aortic valve: Moderately calcified with likely moderate AS given degree of LV dysfunction - Mitral valve: Mild regurgitation. - Left atrium: The atrium was mildly dilated. - Atrial septum: No defect or patent foramen ovale was identified.   Venous duplex 8/14 no DVT   Cath 07/13/13 by Dr Burt Knack no significant AS and no CAD  Left mainstem: No stenosis, arises from left cusp.  Left anterior descending (LAD): Patent to the distal anterior wall. No significant stenosis throughout  Left circumflex (LCx): Widely patent. OM2 and OM3 are the major branches and they have no stenosis.  Right coronary artery (RCA): normal caliber vessel with mild 20-30% mid-vessel stenosis. PDA is large without significant disease.  Left ventriculography: Left ventricular systolic function is moderately depressed (global). The LVEF is estimated at 35-40%.  Final Conclusions:  1. Minimal nonobstructive CAD  2. No significant aortic stenosis (no transvalvular gradient with simultaneous pressures)  3. Moderately severe global LV dysfunction  Recommendations: med Rx for cardiomyopathy.   Right heart pressures ok with no pulmonary hypertension  Hemodynamics  RA 9  RV 36/9  PA 40/14 mean 27  PCWP 17  LV 122/11  AO 122/74 mean 89  Oxygen saturations:  PA 63  AO 91  Cardiac Output (Fick) 4.4  Cardiac Index (Fick) 2.63   She is having back problems again Seeing [facility]  She has history of ruptured disc.  Not taking her  diuretic ran out.   Has oxygen dependant COPD Sees [facility]  Not a great candidate for general anesthesia base on this     ROS: Denies fever, malais, weight loss, blurry vision, decreased visual acuity, cough, sputum,hemoptysis, pleuritic pain, palpitaitons, heartburn, abdominal pain, melena, lower extremity edema, claudication, or rash.  All other systems reviewed and negative  General: Affect appropriate Chronically ill female  HEENT: normal Neck supple with no adenopathy JVP normal no bruits no thyromegaly Lungs no wheezing and good diaphragmatic motion Heart:  S1/S2 SEM , no rub, gallop or click PMI normal Abdomen: benighn, BS positve, no tenderness, no AAA no bruit.  No HSM or HJR Distal pulses intact with no bruits Plus one  edema Neuro non-focal Skin warm and dry No muscular weakness   Current Outpatient Prescriptions  Medication Sig Dispense Refill  . albuterol (PROVENTIL HFA;VENTOLIN HFA) 108 (90 BASE) MCG/ACT inhaler Inhale 2 puffs into the lungs 2 (two) times daily as needed for wheezing or shortness of breath (copd/asthma).     . ALPRAZolam (XANAX) 0.25 MG tablet Take 0.25 mg by mouth daily.    Marland Kitchen atenolol (TENORMIN) 25 MG tablet Take 1 tablet (25 mg total) by mouth daily. 30 tablet 11  . azelastine (ASTELIN) 0.1 % nasal spray 1-2 puffs each nostril once or twice daily, 30 mL prn  . budesonide (PULMICORT) 0.25 MG/2ML nebulizer solution USE 1 VIAL IN NEBULIZER TWICE A DAY 360 mL 3  . guaiFENesin (MUCINEX) 600 MG 12 hr tablet Take 600 mg by mouth daily.    . hydrochlorothiazide  25 MG tablet Take 25 mg by mouth daily.     Marland Kitchen ipratropium (ATROVENT) 0.02 % nebulizer solution USE 1 VIAL IN NEBULIZER THREE TIMES A DAY DX 493.20 187.5 mL 6  . levalbuterol (XOPENEX) 0.63 MG/3ML nebulizer solution USE 3 MLS BY NEBULIZATIONEVERY 6 HOURS AS NEEDED FOR SHORTNESS OF BREATH/WHEEZING dx 493.20 360 mL 5  . losartan (COZAAR) 25 MG tablet TAKE 1 TABLET (25 MG TOTAL) BY MOUTH DAILY. 30 tablet  1  . omeprazole (PRILOSEC) 20 MG capsule Take 20 mg by mouth 2 (two) times daily before a meal.     . PERFOROMIST 20 MCG/2ML nebulizer solution USE 1 VIAL IN NEBULIZER TWICE A DAY 120 mL 12  . predniSONE (DELTASONE) 10 MG tablet Take 1 tablet (10 mg total) by mouth daily with breakfast. Or as directed 100 tablet 3  . simvastatin (ZOCOR) 20 MG tablet Take 20 mg by mouth daily.     . [DISCONTINUED] metoprolol tartrate (LOPRESSOR) 25 MG tablet Take 1 tablet (25 mg total) by mouth 2 (two) times daily. 30 tablet 0  . [DISCONTINUED] pantoprazole (PROTONIX) 40 MG tablet Take 1 tablet (40 mg total) by mouth daily at 12 noon. 10 tablet 0   No current facility-administered medications for this visit.    Allergies  Amoxicillin-pot clavulanate; Daliresp; Diltiazem; Montelukast sodium; Zafirlukast; and Adhesive  Electrocardiogram:  07/15/14  SR PAC normal STlT waves   Assessment and Plan

## 2014-11-07 ENCOUNTER — Ambulatory Visit (INDEPENDENT_AMBULATORY_CARE_PROVIDER_SITE_OTHER): Payer: Medicare Other | Admitting: Cardiovascular Disease

## 2014-11-07 ENCOUNTER — Encounter: Payer: Self-pay | Admitting: Cardiovascular Disease

## 2014-11-07 VITALS — BP 90/64 | HR 57 | Ht 62.0 in | Wt 146.2 lb

## 2014-11-07 DIAGNOSIS — I10 Essential (primary) hypertension: Secondary | ICD-10-CM

## 2014-11-07 DIAGNOSIS — E785 Hyperlipidemia, unspecified: Secondary | ICD-10-CM | POA: Diagnosis not present

## 2014-11-07 DIAGNOSIS — I350 Nonrheumatic aortic (valve) stenosis: Secondary | ICD-10-CM

## 2014-11-07 DIAGNOSIS — J449 Chronic obstructive pulmonary disease, unspecified: Secondary | ICD-10-CM | POA: Diagnosis not present

## 2014-11-07 DIAGNOSIS — I480 Paroxysmal atrial fibrillation: Secondary | ICD-10-CM

## 2014-11-07 NOTE — Assessment & Plan Note (Signed)
Cholesterol is at goal.  Continue current dose of statin and diet Rx.  No myalgias or side effects.  F/U  LFT's in 6 months. Lab Results  Component Value Date   LDLCALC * 07/30/2007    127        Total Cholesterol/HDL:CHD Risk Coronary Heart Disease Risk Table                     Men   Women  1/2 Average Risk   3.4   3.3

## 2014-11-07 NOTE — Patient Instructions (Signed)
Your physician recommends that you schedule a follow-up appointment in:   [place] has recommended you make the following change in your medication:  STOP  LOSARTAN

## 2014-11-07 NOTE — Assessment & Plan Note (Signed)
Maint NSR with no palpitations  Improved

## 2014-11-07 NOTE — Assessment & Plan Note (Signed)
BP low stop losartan and reevalutate

## 2014-11-07 NOTE — Assessment & Plan Note (Signed)
No significant gradient at cath no change in murmur observe

## 2014-11-07 NOTE — Assessment & Plan Note (Signed)
Sees Dr Annamaria Boots  Recent flair Rx with prednison  Improved  Has oxygen but tends to only wear it when she's driving

## 2014-11-13 ENCOUNTER — Other Ambulatory Visit: Payer: Self-pay | Admitting: Internal Medicine

## 2014-11-13 ENCOUNTER — Telehealth: Payer: Self-pay | Admitting: Internal Medicine

## 2014-11-13 MED ORDER — ALBUTEROL SULFATE HFA 108 (90 BASE) MCG/ACT IN AERS: 2.0000 | INHALATION_SPRAY | Freq: Two times a day (BID) | RESPIRATORY_TRACT | Status: DC | PRN

## 2014-11-13 MED ORDER — PREDNISONE 10 MG PO TABS: ORAL_TABLET | ORAL | Status: DC

## 2014-11-13 NOTE — Telephone Encounter (Signed)
Spoke with pt. States that she has started wheezing again. Cough and congestion are also present. Finished prednisone 2 weeks ago and symptoms have come back.  Allergies  Allergen Reactions  . Amoxicillin-Pot Clavulanate     GI upset  . Daliresp [Roflumilast] Other (See Comments)    unknown  . Diltiazem Nausea Only  . Montelukast Sodium Other (See Comments)     flu-like symptoms  . Zafirlukast Other (See Comments)    unknown  . Adhesive [Tape] Rash    Please use paper tape    CY - please advise. Thanks.

## 2014-11-13 NOTE — Telephone Encounter (Signed)
Called and spoke with pt and she is aware of CY recs.  She is aware of med that has been sent to her pharmacy and nothing further is needed.

## 2014-11-13 NOTE — Telephone Encounter (Signed)
Is there a minimum amount of daily prednisone she thinks she needs to keep her stable? Otherwise I suggest We send prednisone 10 mg, # 50, and take 3 x 2 days, then 1 or 2 daily

## 2014-11-26 ENCOUNTER — Telehealth: Payer: Self-pay | Admitting: Internal Medicine

## 2014-11-26 MED ORDER — DOXYCYCLINE HYCLATE 100 MG PO TABS: 100.0000 mg | ORAL_TABLET | Freq: Two times a day (BID) | ORAL | Status: DC

## 2014-11-26 NOTE — Telephone Encounter (Signed)
Spoke with pt. States that she is getting sick. Reports cough with yellow mucus production, hoarseness, rib pain, SOB and chest tightness. Has had temp of 101. Would like something to be sent in.  Allergies  Allergen Reactions  . Amoxicillin-Pot Clavulanate     GI upset  . Daliresp [Roflumilast] Other (See Comments)    unknown  . Diltiazem Nausea Only  . Montelukast Sodium Other (See Comments)     flu-like symptoms  . Zafirlukast Other (See Comments)    unknown  . Adhesive [Tape] Rash    Please use paper tape   CY - please advise. Thanks.

## 2014-11-26 NOTE — Telephone Encounter (Signed)
Doxycycline 100 mg, # 14, 1 twice daily 

## 2014-11-26 NOTE — Telephone Encounter (Signed)
Spoke with pt, she is aware of recs.  Med sent in.  Nothing further needed.

## 2014-12-07 ENCOUNTER — Telehealth: Payer: Self-pay | Admitting: Internal Medicine

## 2014-12-07 NOTE — Telephone Encounter (Signed)
Patient notified.  Nothing further needed. 

## 2014-12-07 NOTE — Telephone Encounter (Signed)
Im not sure there is more to do, since the infection part has responded. Lets see how she is doing in the early part of next week.

## 2014-12-07 NOTE — Telephone Encounter (Signed)
Patient says that she had an infection last week, coughing up a lot of mucus.  She took 7 days of antibiotics.  She said she completed the antibiotics, she stopped coughing up mucus, but she is feeling worse.  She said she is very tired and her breathing is worse, she cannot cough up anything.  She is taking Prednisone 20mg  daily to see if that will help.  Using neb twice a day.  Having to use oxygen during the day as well as at night.   Current Outpatient Prescriptions on File Prior to Visit  Medication Sig Dispense Refill  . ALPRAZolam (XANAX) 0.25 MG tablet Take 0.25 mg by mouth daily.    Marland Kitchen atenolol (TENORMIN) 25 MG tablet Take 1 tablet (25 mg total) by mouth daily. 30 tablet 11  . azelastine (ASTELIN) 0.1 % nasal spray 1-2 puffs each nostril once or twice daily, 30 mL prn  . budesonide (PULMICORT) 0.25 MG/2ML nebulizer solution USE 1 VIAL IN NEBULIZER TWICE A DAY 360 mL 3  . doxycycline (VIBRA-TABS) 100 MG tablet Take 1 tablet (100 mg total) by mouth 2 (two) times daily. 14 tablet 0  . guaiFENesin (MUCINEX) 600 MG 12 hr tablet Take 600 mg by mouth daily.    . hydrochlorothiazide 25 MG tablet Take 25 mg by mouth daily.     Marland Kitchen ipratropium (ATROVENT) 0.02 % nebulizer solution USE 1 VIAL IN NEBULIZER THREE TIMES A DAY DX 493.20 187.5 mL 6  . levalbuterol (XOPENEX) 0.63 MG/3ML nebulizer solution USE 3 MLS BY NEBULIZATIONEVERY 6 HOURS AS NEEDED FOR SHORTNESS OF BREATH/WHEEZING dx 493.20 360 mL 5  . losartan (COZAAR) 25 MG tablet TAKE 1 TABLET (25 MG TOTAL) BY MOUTH DAILY. 30 tablet 1  . omeprazole (PRILOSEC) 20 MG capsule Take 20 mg by mouth 2 (two) times daily before a meal.     . PERFOROMIST 20 MCG/2ML nebulizer solution USE 1 VIAL IN NEBULIZER TWICE A DAY 120 mL 12  . predniSONE (DELTASONE) 10 MG tablet Take 3 tablets by mouth x 2 days then 1 to 2 tablets by mouth daily 50 tablet 0  . PROAIR HFA 108 (90 BASE) MCG/ACT inhaler INHALE 2 PUFFS BY MOUTH EVERY 6 HOURS AS NEEDED FOR WHEEZING OR SHORTNESS  OF BREATH 8.5 Inhaler 2  . simvastatin (ZOCOR) 20 MG tablet Take 20 mg by mouth daily.     . [DISCONTINUED] metoprolol tartrate (LOPRESSOR) 25 MG tablet Take 1 tablet (25 mg total) by mouth 2 (two) times daily. 30 tablet 0  . [DISCONTINUED] pantoprazole (PROTONIX) 40 MG tablet Take 1 tablet (40 mg total) by mouth daily at 12 noon. 10 tablet 0   No current facility-administered medications on file prior to visit.   Allergies  Allergen Reactions  . Amoxicillin-Pot Clavulanate     GI upset  . Daliresp [Roflumilast] Other (See Comments)    unknown  . Diltiazem Nausea Only  . Montelukast Sodium Other (See Comments)     flu-like symptoms  . Zafirlukast Other (See Comments)    unknown  . Adhesive [Tape] Rash    Please use paper tape

## 2014-12-10 ENCOUNTER — Telehealth: Payer: Self-pay | Admitting: Internal Medicine

## 2014-12-10 MED ORDER — PREDNISONE 10 MG PO TABS: ORAL_TABLET | ORAL | Status: DC

## 2014-12-10 NOTE — Telephone Encounter (Signed)
Patient says that she is feeling really bad, her breathing is worse and she cannot go without oxygen all day.  She would like to be seen tomorrow, but she said that she was told there were no appointments available tomorrow.  She wants to know if she should increase her prednisone until she comes in.  She has been taking 2 prednisone daily.    Current Outpatient Prescriptions on File Prior to Visit  Medication Sig Dispense Refill  . ALPRAZolam (XANAX) 0.25 MG tablet Take 0.25 mg by mouth daily.    Marland Kitchen atenolol (TENORMIN) 25 MG tablet Take 1 tablet (25 mg total) by mouth daily. 30 tablet 11  . azelastine (ASTELIN) 0.1 % nasal spray 1-2 puffs each nostril once or twice daily, 30 mL prn  . budesonide (PULMICORT) 0.25 MG/2ML nebulizer solution USE 1 VIAL IN NEBULIZER TWICE A DAY 360 mL 3  . doxycycline (VIBRA-TABS) 100 MG tablet Take 1 tablet (100 mg total) by mouth 2 (two) times daily. 14 tablet 0  . guaiFENesin (MUCINEX) 600 MG 12 hr tablet Take 600 mg by mouth daily.    . hydrochlorothiazide 25 MG tablet Take 25 mg by mouth daily.     Marland Kitchen ipratropium (ATROVENT) 0.02 % nebulizer solution USE 1 VIAL IN NEBULIZER THREE TIMES A DAY DX 493.20 187.5 mL 6  . levalbuterol (XOPENEX) 0.63 MG/3ML nebulizer solution USE 3 MLS BY NEBULIZATIONEVERY 6 HOURS AS NEEDED FOR SHORTNESS OF BREATH/WHEEZING dx 493.20 360 mL 5  . losartan (COZAAR) 25 MG tablet TAKE 1 TABLET (25 MG TOTAL) BY MOUTH DAILY. 30 tablet 1  . omeprazole (PRILOSEC) 20 MG capsule Take 20 mg by mouth 2 (two) times daily before a meal.     . PERFOROMIST 20 MCG/2ML nebulizer solution USE 1 VIAL IN NEBULIZER TWICE A DAY 120 mL 12  . predniSONE (DELTASONE) 10 MG tablet Take 3 tablets by mouth x 2 days then 1 to 2 tablets by mouth daily 50 tablet 0  . PROAIR HFA 108 (90 BASE) MCG/ACT inhaler INHALE 2 PUFFS BY MOUTH EVERY 6 HOURS AS NEEDED FOR WHEEZING OR SHORTNESS OF BREATH 8.5 Inhaler 2  . simvastatin (ZOCOR) 20 MG tablet Take 20 mg by mouth daily.     .  [DISCONTINUED] metoprolol tartrate (LOPRESSOR) 25 MG tablet Take 1 tablet (25 mg total) by mouth 2 (two) times daily. 30 tablet 0  . [DISCONTINUED] pantoprazole (PROTONIX) 40 MG tablet Take 1 tablet (40 mg total) by mouth daily at 12 noon. 10 tablet 0   No current facility-administered medications on file prior to visit.   Allergies  Allergen Reactions  . Amoxicillin-Pot Clavulanate     GI upset  . Daliresp [Roflumilast] Other (See Comments)    unknown  . Diltiazem Nausea Only  . Montelukast Sodium Other (See Comments)     flu-like symptoms  . Zafirlukast Other (See Comments)    unknown  . Adhesive [Tape] Rash    Please use paper tape

## 2014-12-10 NOTE — Telephone Encounter (Signed)
Pt aware of rec's per CY Rx for Prednisone 10mg  #50, ref x1  Pt has appt with CY on 12/12/14 for ROV Nothing further needed.

## 2014-12-10 NOTE — Telephone Encounter (Signed)
If she thinks she has infection- fever, green mucus, then we will send antibiotic tomorrow.  For now go up to prednisone 60 mg daily until this breaks. Ok to script prednisone 10 mg, # 50, ref x 1.  ER if really bad.

## 2014-12-12 ENCOUNTER — Ambulatory Visit (INDEPENDENT_AMBULATORY_CARE_PROVIDER_SITE_OTHER)
Admission: RE | Admit: 2014-12-12 | Discharge: 2014-12-12 | Disposition: A | Discharge status: Home / Self Care | Payer: Medicare Other | Source: Ambulatory Visit | Attending: Internal Medicine | Admitting: Internal Medicine

## 2014-12-12 ENCOUNTER — Encounter: Payer: Self-pay | Admitting: Internal Medicine

## 2014-12-12 ENCOUNTER — Ambulatory Visit (INDEPENDENT_AMBULATORY_CARE_PROVIDER_SITE_OTHER): Payer: Medicare Other | Admitting: Internal Medicine

## 2014-12-12 VITALS — BP 142/90 | HR 83 | Ht 62.0 in | Wt 142.4 lb

## 2014-12-12 DIAGNOSIS — J45909 Unspecified asthma, uncomplicated: Secondary | ICD-10-CM | POA: Diagnosis not present

## 2014-12-12 DIAGNOSIS — I350 Nonrheumatic aortic (valve) stenosis: Secondary | ICD-10-CM

## 2014-12-12 DIAGNOSIS — J449 Chronic obstructive pulmonary disease, unspecified: Secondary | ICD-10-CM | POA: Diagnosis not present

## 2014-12-12 DIAGNOSIS — J441 Chronic obstructive pulmonary disease with (acute) exacerbation: Secondary | ICD-10-CM

## 2014-12-12 NOTE — Assessment & Plan Note (Signed)
I can't tell how important  her documented aortic stenosis is as a component of her dyspnea and exacerbations. Usually she seems to have  viral triggered exacerbations of COPD responsive to prednisone. It is not obvious if she is having change in cardiac output or pulmonary edema.
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
## 2014-12-12 NOTE — Assessment & Plan Note (Signed)
She had a definite bronchitic exacerbation 2 weeks ago, likely viral. She is now dealing with a post viral bronchitis exacerbation of her severe COPD. This will progress slowly. Usually she responds best to prednisone. Plan-chest x-ray, prednisone taper

## 2014-12-12 NOTE — Patient Instructions (Signed)
Order- CXR   Dx acute exacerbation of COPD  Suggest you taper prednisone 60 mg x 2 days, 40 mg x 2 days, 30 mg x 2 days, 20 mg x 2 days, then 10 mg daily. Let us know if you need a script for more prednisone

## 2014-12-13 DIAGNOSIS — B370 Candidal stomatitis: Secondary | ICD-10-CM | POA: Diagnosis not present

## 2014-12-13 DIAGNOSIS — Z6826 Body mass index (BMI) 26.0-26.9, adult: Secondary | ICD-10-CM | POA: Diagnosis not present

## 2014-12-13 DIAGNOSIS — L03114 Cellulitis of left upper limb: Secondary | ICD-10-CM | POA: Diagnosis not present

## 2014-12-18 NOTE — Progress Notes (Signed)
Quick Note:  Called and spoke to pt. Informed pt of the results and recs per CY. Pt verbalized understanding and denied any further questions or concerns at this time. ______ 

## 2014-12-24 ENCOUNTER — Telehealth: Payer: Self-pay | Admitting: Internal Medicine

## 2014-12-24 NOTE — Telephone Encounter (Signed)
Called spoke with patient and discussed CY's recommendations with her.  Pt stated she is already taking Mucinex but will continue this along with trying Claritin or Allegra.  Pt will call back if symptoms do not improve or they worsen.  Nothing further needed; will sign off.

## 2014-12-24 NOTE — Telephone Encounter (Signed)
Called and spoke to pt. Pt c/o rhinorrhea, non prod cough, chest congestion, and increase in SOB. Pt denies CP/tightness, f/c/s, sinus pressure. Pt stated she is not taking anything new OTC. Pt requesting recs.   CY please advise.   Allergies  Allergen Reactions  . Amoxicillin-Pot Clavulanate     GI upset  . Daliresp [Roflumilast] Other (See Comments)    unknown  . Diltiazem Nausea Only  . Montelukast Sodium Other (See Comments)     flu-like symptoms  . Zafirlukast Other (See Comments)    unknown  . Adhesive [Tape] Rash    Please use paper tape    Current Outpatient Prescriptions on File Prior to Visit  Medication Sig Dispense Refill  . ALPRAZolam (XANAX) 0.25 MG tablet Take 0.25 mg by mouth daily.    Marland Kitchen atenolol (TENORMIN) 25 MG tablet Take 1 tablet (25 mg total) by mouth daily. 30 tablet 11  . azelastine (ASTELIN) 0.1 % nasal spray 1-2 puffs each nostril once or twice daily, 30 mL prn  . budesonide (PULMICORT) 0.25 MG/2ML nebulizer solution USE 1 VIAL IN NEBULIZER TWICE A DAY 360 mL 3  . doxycycline (VIBRA-TABS) 100 MG tablet Take 1 tablet (100 mg total) by mouth 2 (two) times daily. 14 tablet 0  . guaiFENesin (MUCINEX) 600 MG 12 hr tablet Take 600 mg by mouth daily.    . hydrochlorothiazide 25 MG tablet Take 25 mg by mouth daily.     Marland Kitchen ipratropium (ATROVENT) 0.02 % nebulizer solution USE 1 VIAL IN NEBULIZER THREE TIMES A DAY DX 493.20 187.5 mL 6  . levalbuterol (XOPENEX) 0.63 MG/3ML nebulizer solution USE 3 MLS BY NEBULIZATIONEVERY 6 HOURS AS NEEDED FOR SHORTNESS OF BREATH/WHEEZING dx 493.20 360 mL 5  . omeprazole (PRILOSEC) 20 MG capsule Take 20 mg by mouth 2 (two) times daily before a meal.     . PERFOROMIST 20 MCG/2ML nebulizer solution USE 1 VIAL IN NEBULIZER TWICE A DAY 120 mL 12  . predniSONE (DELTASONE) 10 MG tablet Take as directed. 50 tablet 1  . PROAIR HFA 108 (90 BASE) MCG/ACT inhaler INHALE 2 PUFFS BY MOUTH EVERY 6 HOURS AS NEEDED FOR WHEEZING OR SHORTNESS OF BREATH 8.5  Inhaler 2  . simvastatin (ZOCOR) 20 MG tablet Take 20 mg by mouth daily.     . [DISCONTINUED] metoprolol tartrate (LOPRESSOR) 25 MG tablet Take 1 tablet (25 mg total) by mouth 2 (two) times daily. 30 tablet 0  . [DISCONTINUED] pantoprazole (PROTONIX) 40 MG tablet Take 1 tablet (40 mg total) by mouth daily at 12 noon. 10 tablet 0   No current facility-administered medications on file prior to visit.

## 2014-12-24 NOTE — Telephone Encounter (Signed)
Suggest Mucinex-DM and maybe an antihistamine like claritin or allegra for nose. She is still on prednisone.

## 2015-01-15 DIAGNOSIS — G35 Multiple sclerosis: Secondary | ICD-10-CM | POA: Diagnosis not present

## 2015-01-15 DIAGNOSIS — I739 Peripheral vascular disease, unspecified: Secondary | ICD-10-CM | POA: Diagnosis not present

## 2015-01-15 DIAGNOSIS — E785 Hyperlipidemia, unspecified: Secondary | ICD-10-CM | POA: Diagnosis not present

## 2015-01-15 DIAGNOSIS — E35 Disorders of endocrine glands in diseases classified elsewhere: Secondary | ICD-10-CM | POA: Diagnosis not present

## 2015-01-15 DIAGNOSIS — Z6825 Body mass index (BMI) 25.0-25.9, adult: Secondary | ICD-10-CM | POA: Diagnosis not present

## 2015-01-15 DIAGNOSIS — J449 Chronic obstructive pulmonary disease, unspecified: Secondary | ICD-10-CM | POA: Diagnosis not present

## 2015-01-15 DIAGNOSIS — I10 Essential (primary) hypertension: Secondary | ICD-10-CM | POA: Diagnosis not present

## 2015-01-18 ENCOUNTER — Telehealth: Payer: Self-pay | Admitting: Internal Medicine

## 2015-01-18 NOTE — Telephone Encounter (Signed)
She has steroid dependent asthma with COPD. Suggest she stay on prednisone 20 mg daily until she feels better, then try to gradually reduce to 10 mg/ day. Ok to send Rx for prednisone 10 mg, # 50, 1 or 2 daily as directed, ref x 1

## 2015-01-18 NOTE — Telephone Encounter (Signed)
Spoke with pt. Reports increased wheezing x3 days. Denies SOB, chest tightness, coughing. Would not give much more information. Wants CY's recommendations.  Allergies  Allergen Reactions  . Amoxicillin-Pot Clavulanate     GI upset  . Daliresp [Roflumilast] Other (See Comments)    unknown  . Diltiazem Nausea Only  . Montelukast Sodium Other (See Comments)     flu-like symptoms  . Zafirlukast Other (See Comments)    unknown  . Adhesive [Tape] Rash    Please use paper tape    Current Outpatient Prescriptions on File Prior to Visit  Medication Sig Dispense Refill  . ALPRAZolam (XANAX) 0.25 MG tablet Take 0.25 mg by mouth daily.    Marland Kitchen atenolol (TENORMIN) 25 MG tablet Take 1 tablet (25 mg total) by mouth daily. 30 tablet 11  . azelastine (ASTELIN) 0.1 % nasal spray 1-2 puffs each nostril once or twice daily, 30 mL prn  . budesonide (PULMICORT) 0.25 MG/2ML nebulizer solution USE 1 VIAL IN NEBULIZER TWICE A DAY 360 mL 3  . doxycycline (VIBRA-TABS) 100 MG tablet Take 1 tablet (100 mg total) by mouth 2 (two) times daily. 14 tablet 0  . guaiFENesin (MUCINEX) 600 MG 12 hr tablet Take 600 mg by mouth daily.    . hydrochlorothiazide 25 MG tablet Take 25 mg by mouth daily.     Marland Kitchen ipratropium (ATROVENT) 0.02 % nebulizer solution USE 1 VIAL IN NEBULIZER THREE TIMES A DAY DX 493.20 187.5 mL 6  . levalbuterol (XOPENEX) 0.63 MG/3ML nebulizer solution USE 3 MLS BY NEBULIZATIONEVERY 6 HOURS AS NEEDED FOR SHORTNESS OF BREATH/WHEEZING dx 493.20 360 mL 5  . omeprazole (PRILOSEC) 20 MG capsule Take 20 mg by mouth 2 (two) times daily before a meal.     . PERFOROMIST 20 MCG/2ML nebulizer solution USE 1 VIAL IN NEBULIZER TWICE A DAY 120 mL 12  . predniSONE (DELTASONE) 10 MG tablet Take as directed. 50 tablet 1  . PROAIR HFA 108 (90 BASE) MCG/ACT inhaler INHALE 2 PUFFS BY MOUTH EVERY 6 HOURS AS NEEDED FOR WHEEZING OR SHORTNESS OF BREATH 8.5 Inhaler 2  . simvastatin (ZOCOR) 20 MG tablet Take 20 mg by mouth daily.      . [DISCONTINUED] metoprolol tartrate (LOPRESSOR) 25 MG tablet Take 1 tablet (25 mg total) by mouth 2 (two) times daily. 30 tablet 0  . [DISCONTINUED] pantoprazole (PROTONIX) 40 MG tablet Take 1 tablet (40 mg total) by mouth daily at 12 noon. 10 tablet 0   No current facility-administered medications on file prior to visit.    CY - please advise. Thanks.

## 2015-01-18 NOTE — Telephone Encounter (Signed)
Patient notified.  Patient says she has enough Prednisone and does not need refill at this time.  She said that she will call us and let us know if she needs a refill.  She says that she doesn't think that this will work because she has been taking prednisone for 3 days now and it has not helped much.  But, she said she will try it and let us know if she is not doing any better. Nothing further needed.

## 2015-02-04 ENCOUNTER — Ambulatory Visit: Payer: Medicare Other | Admitting: Cardiovascular Disease

## 2015-02-06 DIAGNOSIS — S9031XA Contusion of right foot, initial encounter: Secondary | ICD-10-CM | POA: Diagnosis not present

## 2015-02-06 DIAGNOSIS — D3613 Benign neoplasm of peripheral nerves and autonomic nervous system of lower limb, including hip: Secondary | ICD-10-CM | POA: Diagnosis not present

## 2015-02-06 DIAGNOSIS — S61459A Open bite of unspecified hand, initial encounter: Secondary | ICD-10-CM | POA: Diagnosis not present

## 2015-02-06 DIAGNOSIS — Z6825 Body mass index (BMI) 25.0-25.9, adult: Secondary | ICD-10-CM | POA: Diagnosis not present

## 2015-02-13 DIAGNOSIS — L03114 Cellulitis of left upper limb: Secondary | ICD-10-CM | POA: Diagnosis not present

## 2015-02-13 DIAGNOSIS — L97519 Non-pressure chronic ulcer of other part of right foot with unspecified severity: Secondary | ICD-10-CM | POA: Diagnosis not present

## 2015-02-13 DIAGNOSIS — Z6825 Body mass index (BMI) 25.0-25.9, adult: Secondary | ICD-10-CM | POA: Diagnosis not present

## 2015-02-20 DIAGNOSIS — D3612 Benign neoplasm of peripheral nerves and autonomic nervous system, upper limb, including shoulder: Secondary | ICD-10-CM | POA: Diagnosis not present

## 2015-02-20 DIAGNOSIS — Z6825 Body mass index (BMI) 25.0-25.9, adult: Secondary | ICD-10-CM | POA: Diagnosis not present

## 2015-02-20 DIAGNOSIS — L97519 Non-pressure chronic ulcer of other part of right foot with unspecified severity: Secondary | ICD-10-CM | POA: Diagnosis not present

## 2015-03-04 DIAGNOSIS — Z6825 Body mass index (BMI) 25.0-25.9, adult: Secondary | ICD-10-CM | POA: Diagnosis not present

## 2015-03-04 DIAGNOSIS — S2232XA Fracture of one rib, left side, initial encounter for closed fracture: Secondary | ICD-10-CM | POA: Diagnosis not present

## 2015-03-11 DIAGNOSIS — S2232XS Fracture of one rib, left side, sequela: Secondary | ICD-10-CM | POA: Diagnosis not present

## 2015-03-11 DIAGNOSIS — Z6825 Body mass index (BMI) 25.0-25.9, adult: Secondary | ICD-10-CM | POA: Diagnosis not present

## 2015-03-12 ENCOUNTER — Other Ambulatory Visit: Payer: Self-pay | Admitting: Internal Medicine

## 2015-03-13 ENCOUNTER — Telehealth: Payer: Self-pay | Admitting: Internal Medicine

## 2015-03-13 MED ORDER — IPRATROPIUM BROMIDE 0.02 % IN SOLN: RESPIRATORY_TRACT | Status: DC

## 2015-03-13 MED ORDER — PREDNISONE 10 MG PO TABS: ORAL_TABLET | ORAL | Status: DC

## 2015-03-13 NOTE — Telephone Encounter (Signed)
Rx has been resent with the ICD 10 code. Nothing further was needed.

## 2015-03-13 NOTE — Telephone Encounter (Signed)
Spoke with pt, requesting prednisone refill. Pt notes increased wheezing with exertion, pnd, sinus congestion X1 week. -also states she fell 2 weeks ago, cracked a couple of ribs-this is being treated by PCP.   Wants to make CY aware of this.  Pt uses [facility] in [place].  Last ov: 12/12/14 Next ov: 04/15/15  CY please advise.  Thanks!  Allergies  Allergen Reactions  . Amoxicillin-Pot Clavulanate     GI upset  . Daliresp [Roflumilast] Other (See Comments)    unknown  . Diltiazem Nausea Only  . Montelukast Sodium Other (See Comments)     flu-like symptoms  . Zafirlukast Other (See Comments)    unknown  . Adhesive [Tape] Rash    Please use paper tape   Current Outpatient Prescriptions on File Prior to Visit  Medication Sig Dispense Refill  . ALPRAZolam (XANAX) 0.25 MG tablet Take 0.25 mg by mouth daily.    Marland Kitchen atenolol (TENORMIN) 25 MG tablet Take 1 tablet (25 mg total) by mouth daily. 30 tablet 11  . azelastine (ASTELIN) 0.1 % nasal spray 1-2 puffs each nostril once or twice daily, 30 mL prn  . budesonide (PULMICORT) 0.25 MG/2ML nebulizer solution USE 1 VIAL IN NEBULIZER TWICE A DAY 360 mL 3  . doxycycline (VIBRA-TABS) 100 MG tablet Take 1 tablet (100 mg total) by mouth 2 (two) times daily. 14 tablet 0  . guaiFENesin (MUCINEX) 600 MG 12 hr tablet Take 600 mg by mouth daily.    . hydrochlorothiazide 25 MG tablet Take 25 mg by mouth daily.     Marland Kitchen ipratropium (ATROVENT) 0.02 % nebulizer solution USE 1 VIAL IN NEBULIZER THREE TIMES A DAY 187.5 mL 6  . levalbuterol (XOPENEX) 0.63 MG/3ML nebulizer solution USE 3 MLS BY NEBULIZATIONEVERY 6 HOURS AS NEEDED FOR SHORTNESS OF BREATH/WHEEZING dx 493.20 360 mL 5  . omeprazole (PRILOSEC) 20 MG capsule Take 20 mg by mouth 2 (two) times daily before a meal.     . PERFOROMIST 20 MCG/2ML nebulizer solution USE 1 VIAL IN NEBULIZER TWICE A DAY 120 mL 12  . predniSONE (DELTASONE) 10 MG tablet Take as directed. 50 tablet 1  . PROAIR HFA 108 (90 BASE) MCG/ACT  inhaler INHALE 2 PUFFS BY MOUTH EVERY 6 HOURS AS NEEDED FOR WHEEZING OR SHORTNESS OF BREATH 8.5 Inhaler 2  . simvastatin (ZOCOR) 20 MG tablet Take 20 mg by mouth daily.     . [DISCONTINUED] metoprolol tartrate (LOPRESSOR) 25 MG tablet Take 1 tablet (25 mg total) by mouth 2 (two) times daily. 30 tablet 0  . [DISCONTINUED] pantoprazole (PROTONIX) 40 MG tablet Take 1 tablet (40 mg total) by mouth daily at 12 noon. 10 tablet 0   No current facility-administered medications on file prior to visit.

## 2015-03-13 NOTE — Telephone Encounter (Signed)
Ok to refill prednisone as last time.

## 2015-03-13 NOTE — Telephone Encounter (Signed)
Pt aware of recs.  pred refilled as last time (#50 with 1refill) per CY.  Nothing further needed.

## 2015-03-18 ENCOUNTER — Telehealth: Payer: Self-pay | Admitting: Internal Medicine

## 2015-03-18 NOTE — Telephone Encounter (Signed)
We know she tends to clear slowly. Is she is distress, or does she think that we can watch over next couple of days as she follows the prednisone taper, to see if she improves?

## 2015-03-18 NOTE — Telephone Encounter (Signed)
Spoke with patient-states she wakes up wheezing(if sitting down-has had to use O2 at times or laying down for bed she does not wheeze). States wheezing starts about 15 minutes after awake. She previously took Prednisone 10 mg tablets at 3 x 2 days, 2 x 1-2 days, then went back to usual 10 mg QD. Could not tell this was helping since being back at 10 mg QD. The hotter weather has been causing her breathing issues as well.   I spoke with CY about patient and feel she is not in any distress; per CY we will have patient take Prednisone 10 mg 1.5 (15 mg) tablets by mouth QD x 1 week and then call our office to report how she is feeling.   Pt is aware; since office is closed Monday for 03-25-15 patient will contact our office of Tuesday 03-26-15. If patient has questions/concerns or feels she is worsening then she will contact our office sooner.   Nothing more needed at this time.

## 2015-03-18 NOTE — Telephone Encounter (Signed)
Spoke with pt. States that she continues to having issues with wheezing. Has been having to use oxygen more often than not. Denies chest tightness, SOB or coughing. Currently tapering down off of prednisone that was sent in last week.  Allergies  Allergen Reactions  . Amoxicillin-Pot Clavulanate     GI upset  . Daliresp [Roflumilast] Other (See Comments)    unknown  . Diltiazem Nausea Only  . Montelukast Sodium Other (See Comments)     flu-like symptoms  . Zafirlukast Other (See Comments)    unknown  . Adhesive [Tape] Rash    Please use paper tape   Current Outpatient Prescriptions on File Prior to Visit  Medication Sig Dispense Refill  . ALPRAZolam (XANAX) 0.25 MG tablet Take 0.25 mg by mouth daily.    Marland Kitchen atenolol (TENORMIN) 25 MG tablet Take 1 tablet (25 mg total) by mouth daily. 30 tablet 11  . azelastine (ASTELIN) 0.1 % nasal spray 1-2 puffs each nostril once or twice daily, 30 mL prn  . budesonide (PULMICORT) 0.25 MG/2ML nebulizer solution USE 1 VIAL IN NEBULIZER TWICE A DAY 360 mL 3  . doxycycline (VIBRA-TABS) 100 MG tablet Take 1 tablet (100 mg total) by mouth 2 (two) times daily. 14 tablet 0  . guaiFENesin (MUCINEX) 600 MG 12 hr tablet Take 600 mg by mouth daily.    . hydrochlorothiazide 25 MG tablet Take 25 mg by mouth daily.     Marland Kitchen ipratropium (ATROVENT) 0.02 % nebulizer solution USE 1 VIAL IN NEBULIZER THREE TIMES A DAY 187.5 mL 6  . levalbuterol (XOPENEX) 0.63 MG/3ML nebulizer solution USE 3 MLS BY NEBULIZATIONEVERY 6 HOURS AS NEEDED FOR SHORTNESS OF BREATH/WHEEZING dx 493.20 360 mL 5  . omeprazole (PRILOSEC) 20 MG capsule Take 20 mg by mouth 2 (two) times daily before a meal.     . PERFOROMIST 20 MCG/2ML nebulizer solution USE 1 VIAL IN NEBULIZER TWICE A DAY 120 mL 12  . predniSONE (DELTASONE) 10 MG tablet Take as directed. 50 tablet 1  . PROAIR HFA 108 (90 BASE) MCG/ACT inhaler INHALE 2 PUFFS BY MOUTH EVERY 6 HOURS AS NEEDED FOR WHEEZING OR SHORTNESS OF BREATH 8.5  Inhaler 2  . simvastatin (ZOCOR) 20 MG tablet Take 20 mg by mouth daily.     . [DISCONTINUED] metoprolol tartrate (LOPRESSOR) 25 MG tablet Take 1 tablet (25 mg total) by mouth 2 (two) times daily. 30 tablet 0  . [DISCONTINUED] pantoprazole (PROTONIX) 40 MG tablet Take 1 tablet (40 mg total) by mouth daily at 12 noon. 10 tablet 0   No current facility-administered medications on file prior to visit.    CY - please advise. Thanks.

## 2015-03-22 ENCOUNTER — Telehealth: Payer: Self-pay | Admitting: Internal Medicine

## 2015-03-22 NOTE — Telephone Encounter (Signed)
Mild persistent wheezing after increasing prednisone to 15 mg daily for past week from usual 10 mg. Mostly she is concerned she won't be able manage husband who is returning home from [place] in two weeks after hemiplegic CVA. Plan- increase prednisone to 30 mg daily x 3 days. Return to 15 mg daily on July 5.

## 2015-03-22 NOTE — Telephone Encounter (Signed)
Spoke with pt, states that on Monday her prednisone was increased from 10mg  to 15mg . Pt still wheezing with exertion, nonprod cough, pnd.   [facility] in [place].    CY please advise on recs.  Thanks!  Allergies  Allergen Reactions  . Amoxicillin-Pot Clavulanate     GI upset  . Daliresp [Roflumilast] Other (See Comments)    unknown  . Diltiazem Nausea Only  . Montelukast Sodium Other (See Comments)     flu-like symptoms  . Zafirlukast Other (See Comments)    unknown  . Adhesive [Tape] Rash    Please use paper tape   Current Outpatient Prescriptions on File Prior to Visit  Medication Sig Dispense Refill  . ALPRAZolam (XANAX) 0.25 MG tablet Take 0.25 mg by mouth daily.    Marland Kitchen atenolol (TENORMIN) 25 MG tablet Take 1 tablet (25 mg total) by mouth daily. 30 tablet 11  . azelastine (ASTELIN) 0.1 % nasal spray 1-2 puffs each nostril once or twice daily, 30 mL prn  . budesonide (PULMICORT) 0.25 MG/2ML nebulizer solution USE 1 VIAL IN NEBULIZER TWICE A DAY 360 mL 3  . doxycycline (VIBRA-TABS) 100 MG tablet Take 1 tablet (100 mg total) by mouth 2 (two) times daily. 14 tablet 0  . guaiFENesin (MUCINEX) 600 MG 12 hr tablet Take 600 mg by mouth daily.    . hydrochlorothiazide 25 MG tablet Take 25 mg by mouth daily.     Marland Kitchen ipratropium (ATROVENT) 0.02 % nebulizer solution USE 1 VIAL IN NEBULIZER THREE TIMES A DAY 187.5 mL 6  . levalbuterol (XOPENEX) 0.63 MG/3ML nebulizer solution USE 3 MLS BY NEBULIZATIONEVERY 6 HOURS AS NEEDED FOR SHORTNESS OF BREATH/WHEEZING dx 493.20 360 mL 5  . omeprazole (PRILOSEC) 20 MG capsule Take 20 mg by mouth 2 (two) times daily before a meal.     . PERFOROMIST 20 MCG/2ML nebulizer solution USE 1 VIAL IN NEBULIZER TWICE A DAY 120 mL 12  . predniSONE (DELTASONE) 10 MG tablet Take as directed. 50 tablet 1  . PROAIR HFA 108 (90 BASE) MCG/ACT inhaler INHALE 2 PUFFS BY MOUTH EVERY 6 HOURS AS NEEDED FOR WHEEZING OR SHORTNESS OF BREATH 8.5 Inhaler 2  . simvastatin (ZOCOR) 20 MG  tablet Take 20 mg by mouth daily.     . [DISCONTINUED] metoprolol tartrate (LOPRESSOR) 25 MG tablet Take 1 tablet (25 mg total) by mouth 2 (two) times daily. 30 tablet 0  . [DISCONTINUED] pantoprazole (PROTONIX) 40 MG tablet Take 1 tablet (40 mg total) by mouth daily at 12 noon. 10 tablet 0   No current facility-administered medications on file prior to visit.

## 2015-03-22 NOTE — Telephone Encounter (Signed)
Spoke with pt, she is aware of recs.  Nothing further needed.  

## 2015-03-22 NOTE — Telephone Encounter (Signed)
See phone note. Increase pred to 30 mg daily x 3 days, then resume 15 mg.

## 2015-04-15 ENCOUNTER — Ambulatory Visit: Payer: Medicare Other | Admitting: Internal Medicine

## 2015-04-15 ENCOUNTER — Encounter: Payer: Self-pay | Admitting: Internal Medicine

## 2015-04-15 VITALS — BP 134/86 | HR 88 | Ht 62.0 in | Wt 131.0 lb

## 2015-04-15 DIAGNOSIS — J4489 Other specified chronic obstructive pulmonary disease: Secondary | ICD-10-CM

## 2015-04-15 MED ORDER — PREDNISONE 10 MG PO TABS: ORAL_TABLET | ORAL | Status: DC
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
## 2015-04-15 NOTE — Patient Instructions (Signed)
Script sent for prednisone refill  Continue present meds  Please call if we can help

## 2015-05-06 ENCOUNTER — Telehealth: Payer: Self-pay | Admitting: Internal Medicine

## 2015-05-06 ENCOUNTER — Other Ambulatory Visit: Payer: Self-pay | Admitting: Internal Medicine

## 2015-05-06 MED ORDER — FORMOTEROL FUMARATE 20 MCG/2ML IN NEBU: INHALATION_SOLUTION | RESPIRATORY_TRACT | Status: DC

## 2015-05-06 MED ORDER — BUDESONIDE 0.25 MG/2ML IN SUSP: RESPIRATORY_TRACT | Status: DC

## 2015-05-06 NOTE — Telephone Encounter (Signed)
Called pt and confirmed medications she needed to be sent in. RX's sent and nothing further needed

## 2015-05-06 NOTE — Telephone Encounter (Signed)
RX's have been resent in. Nothing further needed

## 2015-05-13 DIAGNOSIS — Z6824 Body mass index (BMI) 24.0-24.9, adult: Secondary | ICD-10-CM | POA: Diagnosis not present

## 2015-05-13 DIAGNOSIS — G5600 Carpal tunnel syndrome, unspecified upper limb: Secondary | ICD-10-CM | POA: Diagnosis not present

## 2015-06-01 DIAGNOSIS — N390 Urinary tract infection, site not specified: Secondary | ICD-10-CM | POA: Diagnosis not present

## 2015-06-04 DIAGNOSIS — E785 Hyperlipidemia, unspecified: Secondary | ICD-10-CM | POA: Diagnosis not present

## 2015-06-04 DIAGNOSIS — J449 Chronic obstructive pulmonary disease, unspecified: Secondary | ICD-10-CM | POA: Diagnosis not present

## 2015-06-04 DIAGNOSIS — I10 Essential (primary) hypertension: Secondary | ICD-10-CM | POA: Diagnosis not present

## 2015-06-04 DIAGNOSIS — Z6824 Body mass index (BMI) 24.0-24.9, adult: Secondary | ICD-10-CM | POA: Diagnosis not present

## 2015-06-04 DIAGNOSIS — R310 Gross hematuria: Secondary | ICD-10-CM | POA: Diagnosis not present

## 2015-06-04 DIAGNOSIS — G35 Multiple sclerosis: Secondary | ICD-10-CM | POA: Diagnosis not present

## 2015-06-10 DIAGNOSIS — Z6824 Body mass index (BMI) 24.0-24.9, adult: Secondary | ICD-10-CM | POA: Diagnosis not present

## 2015-06-10 DIAGNOSIS — M7711 Lateral epicondylitis, right elbow: Secondary | ICD-10-CM | POA: Diagnosis not present

## 2015-06-10 DIAGNOSIS — M792 Neuralgia and neuritis, unspecified: Secondary | ICD-10-CM | POA: Diagnosis not present

## 2015-06-17 DIAGNOSIS — G5600 Carpal tunnel syndrome, unspecified upper limb: Secondary | ICD-10-CM | POA: Diagnosis not present

## 2015-06-17 DIAGNOSIS — M1831 Unilateral post-traumatic osteoarthritis of first carpometacarpal joint, right hand: Secondary | ICD-10-CM | POA: Diagnosis not present

## 2015-06-20 ENCOUNTER — Other Ambulatory Visit: Payer: Self-pay | Admitting: Internal Medicine

## 2015-06-28 ENCOUNTER — Other Ambulatory Visit: Payer: Self-pay | Admitting: Internal Medicine

## 2015-06-28 ENCOUNTER — Telehealth: Payer: Self-pay | Admitting: Internal Medicine

## 2015-06-28 MED ORDER — PREDNISONE 10 MG PO TABS: ORAL_TABLET | ORAL | Status: DC

## 2015-06-28 NOTE — Telephone Encounter (Signed)
Spoke with pt, needs refill on prednisone.  This has been sent.  Nothing further needed.  

## 2015-07-02 DIAGNOSIS — M47817 Spondylosis without myelopathy or radiculopathy, lumbosacral region: Secondary | ICD-10-CM | POA: Diagnosis not present

## 2015-07-02 DIAGNOSIS — Z23 Encounter for immunization: Secondary | ICD-10-CM | POA: Diagnosis not present

## 2015-07-02 DIAGNOSIS — Z6825 Body mass index (BMI) 25.0-25.9, adult: Secondary | ICD-10-CM | POA: Diagnosis not present

## 2015-07-12 DIAGNOSIS — Z1231 Encounter for screening mammogram for malignant neoplasm of breast: Secondary | ICD-10-CM | POA: Diagnosis not present

## 2015-07-23 ENCOUNTER — Telehealth: Payer: Self-pay | Admitting: Internal Medicine

## 2015-07-23 MED ORDER — PREDNISONE 10 MG PO TABS: ORAL_TABLET | ORAL | Status: DC

## 2015-07-23 NOTE — Telephone Encounter (Signed)
Ok to refill prednisone and ask her to take 40 mg daily until she feels better, then taper down as tolerated

## 2015-07-23 NOTE — Telephone Encounter (Signed)
Patient is wheezing really bad x 1 week, sob, chest does not feel tight. No fever, just "does not feel good";  Fatigue.  Patient increased prednisone to 2 daily last week and felt a little better, but did not want to continue because she didn't want to run out of Prednisone.    Pharmacy:  [facility] [place]  Allergies  Allergen Reactions  . Amoxicillin-Pot Clavulanate     GI upset  . Daliresp [Roflumilast] Other (See Comments)    unknown  . Diltiazem Nausea Only  . Montelukast Sodium Other (See Comments)     flu-like symptoms  . Zafirlukast Other (See Comments)    unknown  . Adhesive [Tape] Rash    Please use paper tape    Current Outpatient Prescriptions on File Prior to Visit  Medication Sig Dispense Refill  . ALPRAZolam (XANAX) 0.25 MG tablet Take 0.25 mg by mouth daily.    Marland Kitchen atenolol (TENORMIN) 25 MG tablet Take 1 tablet (25 mg total) by mouth daily. 30 tablet 11  . azelastine (ASTELIN) 0.1 % nasal spray 1-2 puffs each nostril once or twice daily, 30 mL prn  . budesonide (PULMICORT) 0.25 MG/2ML nebulizer solution USE 1 VIAL IN NEBULIZER TWICE A DAY 120 mL 6  . doxycycline (VIBRA-TABS) 100 MG tablet Take 1 tablet (100 mg total) by mouth 2 (two) times daily. 14 tablet 0  . formoterol (PERFOROMIST) 20 MCG/2ML nebulizer solution USE 1 VIAL IN NEBULIZER TWICE A DAY 120 mL 6  . guaiFENesin (MUCINEX) 600 MG 12 hr tablet Take 600 mg by mouth daily.    . hydrochlorothiazide 25 MG tablet Take 25 mg by mouth daily.     Marland Kitchen ipratropium (ATROVENT) 0.02 % nebulizer solution USE 1 VIAL IN NEBULIZER THREE TIMES A DAY 187.5 mL 6  . levalbuterol (XOPENEX) 0.63 MG/3ML nebulizer solution Take 3 mLs (0.63 mg total) by nebulization every 6 (six) hours as needed for wheezing or shortness of breath. 360 mL 5  . omeprazole (PRILOSEC) 20 MG capsule Take 20 mg by mouth 2 (two) times daily before a meal.     . predniSONE (DELTASONE) 10 MG tablet Take as directed. 50 tablet 1  . PROAIR HFA 108 (90 BASE) MCG/ACT  inhaler INHALE 2 PUFFS BY MOUTH EVERY 6 HOURS AS NEEDED FOR WHEEZING OR SHORTNESS OF BREATH 8.5 Inhaler 2  . simvastatin (ZOCOR) 20 MG tablet Take 20 mg by mouth daily.     . [DISCONTINUED] metoprolol tartrate (LOPRESSOR) 25 MG tablet Take 1 tablet (25 mg total) by mouth 2 (two) times daily. 30 tablet 0  . [DISCONTINUED] pantoprazole (PROTONIX) 40 MG tablet Take 1 tablet (40 mg total) by mouth daily at 12 noon. 10 tablet 0   No current facility-administered medications on file prior to visit.

## 2015-07-23 NOTE — Telephone Encounter (Signed)
Called [facility] and they needed instructions written on RX.  Rx re-submitted with Dr. Janee Morn instructions. Nothing further needed. Closing encounter

## 2015-07-23 NOTE — Telephone Encounter (Signed)
Patient notified of Dr. Janee Morn recommendations.  Refill of Prednisone sent to pharmacy.  Patient notified. Nothing further needed. Closing encounter

## 2015-07-29 ENCOUNTER — Telehealth: Payer: Self-pay | Admitting: Internal Medicine

## 2015-07-29 MED ORDER — AZELASTINE HCL 0.1 % NA SOLN: NASAL | Status: DC

## 2015-07-29 NOTE — Telephone Encounter (Signed)
Patient says that she is feeling some better, but not a lot better.  She says she is sneezing and her nose is dripping every where.  She is not wheezing as bad as she was.  She just "does not feel good".  She says that she does not have fever or chills.  She said she took Loratadine for 3 days and she doesn't think it is helping, she has been sneezing about 25 times a day.  Patient is on 3 tablets daily on the Prednisone Taper now.     [facility]  Allergies  Allergen Reactions  . Amoxicillin-Pot Clavulanate     GI upset  . Daliresp [Roflumilast] Other (See Comments)    unknown  . Diltiazem Nausea Only  . Montelukast Sodium Other (See Comments)     flu-like symptoms  . Zafirlukast Other (See Comments)    unknown  . Adhesive [Tape] Rash    Please use paper tape   Current Outpatient Prescriptions on File Prior to Visit  Medication Sig Dispense Refill  . ALPRAZolam (XANAX) 0.25 MG tablet Take 0.25 mg by mouth daily.    Marland Kitchen atenolol (TENORMIN) 25 MG tablet Take 1 tablet (25 mg total) by mouth daily. 30 tablet 11  . azelastine (ASTELIN) 0.1 % nasal spray 1-2 puffs each nostril once or twice daily, 30 mL prn  . budesonide (PULMICORT) 0.25 MG/2ML nebulizer solution USE 1 VIAL IN NEBULIZER TWICE A DAY 120 mL 6  . doxycycline (VIBRA-TABS) 100 MG tablet Take 1 tablet (100 mg total) by mouth 2 (two) times daily. 14 tablet 0  . formoterol (PERFOROMIST) 20 MCG/2ML nebulizer solution USE 1 VIAL IN NEBULIZER TWICE A DAY 120 mL 6  . guaiFENesin (MUCINEX) 600 MG 12 hr tablet Take 600 mg by mouth daily.    . hydrochlorothiazide 25 MG tablet Take 25 mg by mouth daily.     Marland Kitchen ipratropium (ATROVENT) 0.02 % nebulizer solution USE 1 VIAL IN NEBULIZER THREE TIMES A DAY 187.5 mL 6  . levalbuterol (XOPENEX) 0.63 MG/3ML nebulizer solution Take 3 mLs (0.63 mg total) by nebulization every 6 (six) hours as needed for wheezing or shortness of breath. 360 mL 5  . omeprazole (PRILOSEC) 20 MG capsule Take 20 mg  by mouth 2 (two) times daily before a meal.     . predniSONE (DELTASONE) 10 MG tablet Take 40mg  daily until feeling better, then taper as tolerated 50 tablet 1  . PROAIR HFA 108 (90 BASE) MCG/ACT inhaler INHALE 2 PUFFS BY MOUTH EVERY 6 HOURS AS NEEDED FOR WHEEZING OR SHORTNESS OF BREATH 8.5 Inhaler 2  . simvastatin (ZOCOR) 20 MG tablet Take 20 mg by mouth daily.     . [DISCONTINUED] metoprolol tartrate (LOPRESSOR) 25 MG tablet Take 1 tablet (25 mg total) by mouth 2 (two) times daily. 30 tablet 0  . [DISCONTINUED] pantoprazole (PROTONIX) 40 MG tablet Take 1 tablet (40 mg total) by mouth daily at 12 noon. 10 tablet 0   No current facility-administered medications on file prior to visit.

## 2015-07-29 NOTE — Telephone Encounter (Signed)
Suggest Rx Azelastine nasal spray, # 1      1-2 puffs each nostril twice daily as needed, refill prn  Alternatively, she could try otc Nasalcrom/ cromol nasal spray     follow directions on box

## 2015-07-29 NOTE — Telephone Encounter (Signed)
Pt is aware of CY's recommendations. Rx has been sent in. Nothing further was needed. 

## 2015-07-30 DIAGNOSIS — L03113 Cellulitis of right upper limb: Secondary | ICD-10-CM | POA: Diagnosis not present

## 2015-07-30 DIAGNOSIS — Z6824 Body mass index (BMI) 24.0-24.9, adult: Secondary | ICD-10-CM | POA: Diagnosis not present

## 2015-08-06 DIAGNOSIS — L03113 Cellulitis of right upper limb: Secondary | ICD-10-CM | POA: Diagnosis not present

## 2015-08-06 DIAGNOSIS — Z6824 Body mass index (BMI) 24.0-24.9, adult: Secondary | ICD-10-CM | POA: Diagnosis not present

## 2015-08-11 DIAGNOSIS — Z23 Encounter for immunization: Secondary | ICD-10-CM | POA: Diagnosis not present

## 2015-08-16 ENCOUNTER — Encounter: Payer: Self-pay | Admitting: Internal Medicine

## 2015-08-16 ENCOUNTER — Ambulatory Visit (INDEPENDENT_AMBULATORY_CARE_PROVIDER_SITE_OTHER): Payer: Medicare Other | Admitting: Internal Medicine

## 2015-08-16 VITALS — BP 122/84 | HR 88 | Ht 62.0 in | Wt 136.2 lb

## 2015-08-16 DIAGNOSIS — J309 Allergic rhinitis, unspecified: Secondary | ICD-10-CM

## 2015-08-16 DIAGNOSIS — F4321 Adjustment disorder with depressed mood: Secondary | ICD-10-CM | POA: Diagnosis not present

## 2015-08-16 DIAGNOSIS — J45909 Unspecified asthma, uncomplicated: Secondary | ICD-10-CM

## 2015-08-16 DIAGNOSIS — J449 Chronic obstructive pulmonary disease, unspecified: Secondary | ICD-10-CM | POA: Diagnosis not present

## 2015-08-16 DIAGNOSIS — I480 Paroxysmal atrial fibrillation: Secondary | ICD-10-CM | POA: Diagnosis not present

## 2015-08-16 DIAGNOSIS — J3089 Other allergic rhinitis: Secondary | ICD-10-CM

## 2015-08-16 DIAGNOSIS — J302 Other seasonal allergic rhinitis: Secondary | ICD-10-CM

## 2015-08-16 NOTE — Assessment & Plan Note (Signed)
I don't think she is having an exacerbation now but she is stressed by her husband so it seems worse.
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
## 2015-08-16 NOTE — Assessment & Plan Note (Signed)
Noticing postnasal drainage making her a little hoarse Plan-antihistamines and continue maintenance prednisone

## 2015-08-16 NOTE — Patient Instructions (Signed)
Ok to finish the doxycycline  Ok to continue prednisone 10 mg daily   Please call if we can help

## 2015-08-16 NOTE — Assessment & Plan Note (Signed)
Tolerating her bronchodilators without loss of heart rate control

## 2015-08-20 ENCOUNTER — Other Ambulatory Visit: Payer: Self-pay | Admitting: Cardiovascular Disease

## 2015-09-05 ENCOUNTER — Other Ambulatory Visit: Payer: Self-pay | Admitting: Internal Medicine

## 2015-09-05 ENCOUNTER — Telehealth: Payer: Self-pay | Admitting: Internal Medicine

## 2015-09-05 MED ORDER — IPRATROPIUM BROMIDE 0.02 % IN SOLN: RESPIRATORY_TRACT | Status: DC

## 2015-09-05 NOTE — Telephone Encounter (Signed)
Spoke with pt. States that she needs refill on ipratropium nebs. This has been sent in. Nothing further was needed at this time.

## 2015-09-25 ENCOUNTER — Telehealth: Payer: Self-pay | Admitting: Internal Medicine

## 2015-09-25 MED ORDER — PREDNISONE 10 MG PO TABS: 10.0000 mg | ORAL_TABLET | Freq: Every day | ORAL | Status: DC

## 2015-09-25 NOTE — Telephone Encounter (Signed)
Okay to refill prednisone.

## 2015-09-25 NOTE — Telephone Encounter (Signed)
Patient is having trouble with her breathing, nose running, draining down throat, wheezing a lot, sneezing a lot. Has 1 week left of prednisone, needs refill on the prednisone.  Worried about getting snow and wants to make sure she has Prednisone.   [facility] [place]  Allergies  Allergen Reactions  . Amoxicillin-Pot Clavulanate     GI upset  . Daliresp [Roflumilast] Other (See Comments)    unknown  . Diltiazem Nausea Only  . Montelukast Sodium Other (See Comments)     flu-like symptoms  . Zafirlukast Other (See Comments)    unknown  . Adhesive [Tape] Rash    Please use paper tape   Current Outpatient Prescriptions on File Prior to Visit  Medication Sig Dispense Refill  . ALPRAZolam (XANAX) 0.25 MG tablet Take 0.25 mg by mouth daily.    Marland Kitchen atenolol (TENORMIN) 25 MG tablet TAKE 1 TABLET BY MOUTH EVERY DAY 30 tablet 1  . azelastine (ASTELIN) 0.1 % nasal spray 1-2 puffs each nostril once or twice daily, 30 mL prn  . budesonide (PULMICORT) 0.25 MG/2ML nebulizer solution USE 1 VIAL IN NEBULIZER TWICE A DAY 120 mL 6  . cephALEXin (KEFLEX) 500 MG capsule Take 500 mg by mouth daily.    . formoterol (PERFOROMIST) 20 MCG/2ML nebulizer solution USE 1 VIAL IN NEBULIZER TWICE A DAY 120 mL 6  . gabapentin (NEURONTIN) 300 MG capsule Take 300 mg by mouth daily.    Marland Kitchen guaiFENesin (MUCINEX) 600 MG 12 hr tablet Take 600 mg by mouth daily.    . hydrochlorothiazide 25 MG tablet Take 25 mg by mouth daily.     Marland Kitchen HYDROcodone-acetaminophen (NORCO/VICODIN) 5-325 MG tablet Take 1 tablet by mouth every 6 (six) hours as needed for moderate pain.    Marland Kitchen ipratropium (ATROVENT) 0.02 % nebulizer solution USE 1 VIAL IN NEBULIZER THREE TIMES A DAY 187.5 mL 6  . levalbuterol (XOPENEX) 0.63 MG/3ML nebulizer solution Take 3 mLs (0.63 mg total) by nebulization every 6 (six) hours as needed for wheezing or shortness of breath. 360 mL 5  . omeprazole (PRILOSEC) 20 MG capsule Take 20 mg by mouth 2 (two) times daily before a meal.      . predniSONE (DELTASONE) 10 MG tablet Take 40mg  daily until feeling better, then taper as tolerated (Patient taking differently: Take 10 mg by mouth daily. ) 50 tablet 1  . PROAIR HFA 108 (90 BASE) MCG/ACT inhaler INHALE 2 PUFFS BY MOUTH EVERY 6 HOURS AS NEEDED FOR WHEEZING OR SHORTNESS OF BREATH 8.5 Inhaler 2  . simvastatin (ZOCOR) 20 MG tablet Take 20 mg by mouth daily.     . [DISCONTINUED] metoprolol tartrate (LOPRESSOR) 25 MG tablet Take 1 tablet (25 mg total) by mouth 2 (two) times daily. 30 tablet 0  . [DISCONTINUED] pantoprazole (PROTONIX) 40 MG tablet Take 1 tablet (40 mg total) by mouth daily at 12 noon. 10 tablet 0   No current facility-administered medications on file prior to visit.

## 2015-09-25 NOTE — Telephone Encounter (Signed)
Called spoke with pt. Aware refill for prednisone sent in. Nothing further needed

## 2015-09-27 ENCOUNTER — Telehealth: Payer: Self-pay | Admitting: Internal Medicine

## 2015-09-27 NOTE — Telephone Encounter (Signed)
Patient calling regarding her neb treatments being too expensive.  Sp[name], he explained that the neb meds were Billed to Part B - No PA needed for that - patient has a Deductible of $183.00/ once this has been met, her meds will be free at that point.   Called and explained this to patient, verbalized understanding. Nothing further needed.

## 2015-10-07 DIAGNOSIS — G35 Multiple sclerosis: Secondary | ICD-10-CM | POA: Diagnosis not present

## 2015-10-07 DIAGNOSIS — J449 Chronic obstructive pulmonary disease, unspecified: Secondary | ICD-10-CM | POA: Diagnosis not present

## 2015-10-07 DIAGNOSIS — Z6824 Body mass index (BMI) 24.0-24.9, adult: Secondary | ICD-10-CM | POA: Diagnosis not present

## 2015-10-07 DIAGNOSIS — E785 Hyperlipidemia, unspecified: Secondary | ICD-10-CM | POA: Diagnosis not present

## 2015-10-07 DIAGNOSIS — I739 Peripheral vascular disease, unspecified: Secondary | ICD-10-CM | POA: Diagnosis not present

## 2015-10-07 DIAGNOSIS — I10 Essential (primary) hypertension: Secondary | ICD-10-CM | POA: Diagnosis not present

## 2015-10-14 ENCOUNTER — Telehealth: Payer: Self-pay | Admitting: Cardiovascular Disease

## 2015-10-14 MED ORDER — ATENOLOL 25 MG PO TABS: 25.0000 mg | ORAL_TABLET | Freq: Every day | ORAL | Status: DC

## 2015-10-14 NOTE — Telephone Encounter (Signed)
Rx sent to pt's pharmacy as requested. Confirmation received. °

## 2015-10-14 NOTE — Telephone Encounter (Signed)
New message       *STAT* If patient is at the pharmacy, call can be transferred to refill team.   1. Which medications need to be refilled? (please list name of each medication and dose if known)  Atenolol 25mg  2. Which pharmacy/location (including street and city if local pharmacy) is medication to be sent to? [email or web address]. Do they need a 30 day or 90 day supply? 90 day supply

## 2015-10-15 ENCOUNTER — Other Ambulatory Visit: Payer: Self-pay | Admitting: Cardiovascular Disease

## 2015-10-16 ENCOUNTER — Telehealth: Payer: Self-pay | Admitting: Internal Medicine

## 2015-10-16 MED ORDER — PREDNISONE 10 MG PO TABS: ORAL_TABLET | ORAL | Status: DC

## 2015-10-16 NOTE — Telephone Encounter (Signed)
Patient notified of Dr. Janee Morn recommendations.  Patient states that the pharmacy would not refill her last prescription because they needed a new Rx sent in. Rx sent in for refill of Prednisone with directions listed below.   Nothing further needed.

## 2015-10-16 NOTE — Telephone Encounter (Signed)
May need to extend prednisone at 20-30 mg daily until feeling better, then gradually taper as tolerated.

## 2015-10-16 NOTE — Telephone Encounter (Signed)
Spoke with pt. Reports increased SOB, chest tightness and wheezing. Denies cough or fever. Onset was 1 week ago. Her PCP gave her Ceftin and prednisone to treat her symptoms. She has a few days left of both prescriptions. Would like CY's recommendations.  Allergies  Allergen Reactions  . Amoxicillin-Pot Clavulanate     GI upset  . Daliresp [Roflumilast] Other (See Comments)    unknown  . Diltiazem Nausea Only  . Montelukast Sodium Other (See Comments)     flu-like symptoms  . Zafirlukast Other (See Comments)    unknown  . Adhesive [Tape] Rash    Please use paper tape   Current Outpatient Prescriptions on File Prior to Visit  Medication Sig Dispense Refill  . ALPRAZolam (XANAX) 0.25 MG tablet Take 0.25 mg by mouth daily.    Marland Kitchen atenolol (TENORMIN) 25 MG tablet Take 1 tablet (25 mg total) by mouth daily. 30 tablet 0  . azelastine (ASTELIN) 0.1 % nasal spray 1-2 puffs each nostril once or twice daily, 30 mL prn  . budesonide (PULMICORT) 0.25 MG/2ML nebulizer solution USE 1 VIAL IN NEBULIZER TWICE A DAY 120 mL 6  . cephALEXin (KEFLEX) 500 MG capsule Take 500 mg by mouth daily.    . formoterol (PERFOROMIST) 20 MCG/2ML nebulizer solution USE 1 VIAL IN NEBULIZER TWICE A DAY 120 mL 6  . gabapentin (NEURONTIN) 300 MG capsule Take 300 mg by mouth daily.    Marland Kitchen guaiFENesin (MUCINEX) 600 MG 12 hr tablet Take 600 mg by mouth daily.    . hydrochlorothiazide 25 MG tablet Take 25 mg by mouth daily.     Marland Kitchen HYDROcodone-acetaminophen (NORCO/VICODIN) 5-325 MG tablet Take 1 tablet by mouth every 6 (six) hours as needed for moderate pain.    Marland Kitchen ipratropium (ATROVENT) 0.02 % nebulizer solution USE 1 VIAL IN NEBULIZER THREE TIMES A DAY 187.5 mL 6  . levalbuterol (XOPENEX) 0.63 MG/3ML nebulizer solution Take 3 mLs (0.63 mg total) by nebulization every 6 (six) hours as needed for wheezing or shortness of breath. 360 mL 5  . omeprazole (PRILOSEC) 20 MG capsule Take 20 mg by mouth 2 (two) times daily before a  meal.     . predniSONE (DELTASONE) 10 MG tablet Take 1 tablet (10 mg total) by mouth daily. 50 tablet 1  . PROAIR HFA 108 (90 BASE) MCG/ACT inhaler INHALE 2 PUFFS BY MOUTH EVERY 6 HOURS AS NEEDED FOR WHEEZING OR SHORTNESS OF BREATH 8.5 Inhaler 2  . simvastatin (ZOCOR) 20 MG tablet Take 20 mg by mouth daily.     . [DISCONTINUED] metoprolol tartrate (LOPRESSOR) 25 MG tablet Take 1 tablet (25 mg total) by mouth 2 (two) times daily. 30 tablet 0  . [DISCONTINUED] pantoprazole (PROTONIX) 40 MG tablet Take 1 tablet (40 mg total) by mouth daily at 12 noon. 10 tablet 0   No current facility-administered medications on file prior to visit.   CY- please advise. Thanks.

## 2015-10-25 DIAGNOSIS — H2511 Age-related nuclear cataract, right eye: Secondary | ICD-10-CM | POA: Diagnosis not present

## 2015-10-25 DIAGNOSIS — Z961 Presence of intraocular lens: Secondary | ICD-10-CM | POA: Diagnosis not present

## 2015-10-25 DIAGNOSIS — H25811 Combined forms of age-related cataract, right eye: Secondary | ICD-10-CM | POA: Diagnosis not present

## 2015-10-28 DIAGNOSIS — H2511 Age-related nuclear cataract, right eye: Secondary | ICD-10-CM | POA: Diagnosis not present

## 2015-11-13 ENCOUNTER — Ambulatory Visit: Payer: Medicare Other | Admitting: Nurse Practitioner

## 2015-11-13 ENCOUNTER — Encounter: Payer: Self-pay | Admitting: Nurse Practitioner

## 2015-11-13 ENCOUNTER — Ambulatory Visit (INDEPENDENT_AMBULATORY_CARE_PROVIDER_SITE_OTHER): Payer: Medicare Other | Admitting: Nurse Practitioner

## 2015-11-13 VITALS — BP 100/80 | HR 75 | Resp 18 | Ht 62.0 in | Wt 132.0 lb

## 2015-11-13 DIAGNOSIS — I350 Nonrheumatic aortic (valve) stenosis: Secondary | ICD-10-CM | POA: Diagnosis not present

## 2015-11-13 MED ORDER — ATENOLOL 25 MG PO TABS: 25.0000 mg | ORAL_TABLET | Freq: Every day | ORAL | Status: DC

## 2015-11-13 NOTE — Progress Notes (Signed)
CARDIOLOGY OFFICE NOTE  Date:  11/13/2015    [name] Date of Birth: 12-20-1932 Medical Record [id number]  PCP:  Lillard Anes, MD  Cardiologist:  Johnsie Cancel    Chief Complaint  Patient presents with  . Cardiac Valve Problem  . Atrial Fibrillation  . Shortness of Breath    1 year check - seen for Dr. Johnsie Cancel    History of Present Illness: [name] is a 82 y.o. female who presents today for a one year check. Seen for Dr. Johnsie Cancel.   She has a history of mild AS, PAF, chronic dyspnea/COPD and chronic back issues. She is oxygen dependent.  EF in the 25% range.   Last seen one year ago.   Comes back today. Here alone. She was not able to come earlier due to her husband having had a stroke. She is now the total caregiver. Kids not really helping but she has a grand daughter that seems to be helping out. Little chest discomfort - she attributes this to her lungs. She is chronically short of breath and on oxygen. Thinks her breathing may be worse. Some dizziness. No syncope. She is fatigued but sounds like she has a lot on her plate at home. She does not check her BP at home. Labs are checked by PCP.    Past Medical History  Diagnosis Date  . Aortic stenosis     mild by echo (2008)  . Paroxysmal atrial fibrillation ([facility] [facility]) 11/08  . Allergic rhinitis     takes Mucinex daily  . Rheumatoid arthritis(714.0)   . Stasis dermatitis   . Multiple sclerosis ([place])   . Asthma     uses Albuterol daily as needed. Pulmicort and Perforomist neb daily  . Bronchitis, chronic obstructive, with exacerbation ([place])   . COPD (chronic obstructive pulmonary disease) ([facility])     Xopenex and Atrobent neb daily   . Hyperlipidemia     takes Simvastatin daily  . Hypertension     takes Losartan daily  . GERD (gastroesophageal reflux disease)     takes Omeprazole daily  . PONV (postoperative nausea and vomiting)   . Peripheral edema   . Shortness of breath     with exertion  . Pneumonia      hx of-as a baby  . History of bronchitis     many yrs ago  . Joint pain   . Joint swelling   . Chronic back pain     HNP  . Bruises easily   . History of gout     no meds required  . Hemorrhoid   . History of colon polyps   . Cataract     right  . Anxiety     takes Alprazolam daily  . History of staph infection     Past Surgical History  Procedure Laterality Date  . Abdominal hysterectomy    . Lumbar disc surgery  2003    L5  . Breast cyst excision    . Colonoscopy    . Cataract surgery Left   . Lumbar laminectomy/decompression microdiscectomy Right 12/15/2013    Procedure: LUMBAR LAMINECTOMY/DECOMPRESSION MICRODISCECTOMY 1 LEVEL two/three;  Surgeon: Winfield Cunas, MD;  Location: [facility];  Service: Neurosurgery;  Laterality: Right;  Right L23 microdiskectomy  . Left and right heart catheterization with coronary angiogram N/A 07/13/2013    Procedure: LEFT AND RIGHT HEART CATHETERIZATION WITH CORONARY ANGIOGRAM;  Surgeon: Blane Ohara, MD;  Location: [facility];  Service: Cardiovascular;  Laterality: N/A;     Medications: Current Outpatient Prescriptions  Medication Sig Dispense Refill  . ALPRAZolam (XANAX) 0.25 MG tablet Take 0.25 mg by mouth daily.    Marland Kitchen atenolol (TENORMIN) 25 MG tablet Take 1 tablet (25 mg total) by mouth daily. 30 tablet 11  . azelastine (ASTELIN) 0.1 % nasal spray 1-2 puffs each nostril once or twice daily, 30 mL prn  . budesonide (PULMICORT) 0.25 MG/2ML nebulizer solution USE 1 VIAL IN NEBULIZER TWICE A DAY 120 mL 6  . formoterol (PERFOROMIST) 20 MCG/2ML nebulizer solution USE 1 VIAL IN NEBULIZER TWICE A DAY 120 mL 6  . guaiFENesin (MUCINEX) 600 MG 12 hr tablet Take 600 mg by mouth daily.    . hydrochlorothiazide 25 MG tablet Take 25 mg by mouth daily.     Marland Kitchen HYDROcodone-acetaminophen (NORCO/VICODIN) 5-325 MG tablet Take 1 tablet by mouth every 6 (six) hours as needed for moderate pain. Reported on 11/13/2015    . ipratropium (ATROVENT)  0.02 % nebulizer solution USE 1 VIAL IN NEBULIZER THREE TIMES A DAY 187.5 mL 6  . levalbuterol (XOPENEX) 0.63 MG/3ML nebulizer solution Take 3 mLs (0.63 mg total) by nebulization every 6 (six) hours as needed for wheezing or shortness of breath. 360 mL 5  . ofloxacin (OCUFLOX) 0.3 % ophthalmic solution 1 DROP IN SURGICAL EYE FOUR TIMES A DAY STARTING SUNDAY BEFORE SURGERY  1  . omeprazole (PRILOSEC) 20 MG capsule Take 20 mg by mouth 2 (two) times daily before a meal.     . prednisoLONE acetate (PRED FORTE) 1 % ophthalmic suspension PLACE 1 DROP IN SURGICAL EYE FOUR TIMES A DAY AFTER SURGERY  1  . predniSONE (DELTASONE) 10 MG tablet Take 1 tablet (10 mg total) by mouth daily. 50 tablet 1  . PROAIR HFA 108 (90 BASE) MCG/ACT inhaler INHALE 2 PUFFS BY MOUTH EVERY 6 HOURS AS NEEDED FOR WHEEZING OR SHORTNESS OF BREATH 8.5 Inhaler 2  . simvastatin (ZOCOR) 20 MG tablet Take 20 mg by mouth daily.     . [DISCONTINUED] metoprolol tartrate (LOPRESSOR) 25 MG tablet Take 1 tablet (25 mg total) by mouth 2 (two) times daily. 30 tablet 0  . [DISCONTINUED] pantoprazole (PROTONIX) 40 MG tablet Take 1 tablet (40 mg total) by mouth daily at 12 noon. 10 tablet 0   No current facility-administered medications for this visit.    Allergies: Allergies  Allergen Reactions  . Amoxicillin-Pot Clavulanate     GI upset  . Daliresp [Roflumilast] Other (See Comments)    unknown  . Diltiazem Nausea Only  . Montelukast Sodium Other (See Comments)     flu-like symptoms  . Zafirlukast Other (See Comments)    unknown  . Adhesive [Tape] Rash    Please use paper tape    Social History: The patient  reports that she quit smoking about 26 years ago. Her smoking use included Cigarettes. She has a 25 pack-year smoking history. She has never used smokeless tobacco. She reports that she does not drink alcohol or use illicit drugs.   Family History: The patient's family history includes Alzheimer's disease in her brother; Asthma  in her mother; Colon cancer in her other; Emphysema in her father; Heart disease in her other.   Review of Systems: Please see the history of present illness.   Otherwise, the review of systems is positive for none.   All other systems are reviewed and negative.   Physical Exam: VS:  BP 100/80 mmHg  Pulse 75  Resp 18  Ht 5\' 2"  (1.575 m)  Wt 132 lb (59.875 kg)  BMI 24.14 kg/m2  SpO2 97% .  BMI Body mass index is 24.14 kg/(m^2).  Wt Readings from Last 3 Encounters:  11/13/15 132 lb (59.875 kg)  08/16/15 136 lb 3.2 oz (61.78 kg)  04/15/15 131 lb (59.421 kg)   BP by me is 140/90.   General: Pleasant. Elderly female who looks chronically ill but in no acute distress.She was 146 pounds one year ago - now down to 132.  HEENT: Normal. Neck: Supple, no JVD, carotid bruits, or masses noted.  Cardiac: Regular rate and rhythm. Harsh murmur of AS noted.  No edema.  Respiratory:  Lungs are fairly clear to auscultation bilaterally with normal work of breathing.  GI: Soft and nontender.  MS: No deformity or atrophy. Gait and ROM intact. Skin: Warm and dry. Color is normal.  Neuro:  Strength and sensation are intact and no gross focal deficits noted.  Psych: Alert, appropriate and with normal affect.   LABORATORY DATA:  EKG:  EKG is ordered today. This demonstrates NSR with anterior infarct, poor R wave progression.  Lab Results  Component Value Date   WBC 11.5* 12/13/2013   HGB 13.5 12/13/2013   HCT 40.4 12/13/2013   PLT 226 12/13/2013   GLUCOSE 103* 12/13/2013   CHOL  07/30/2007    195        ATP III CLASSIFICATION:  <200     mg/dL   Desirable  200-239  mg/dL   Borderline High  >=240    mg/dL   High   TRIG 143 07/30/2007   HDL 39* 07/30/2007   LDLCALC * 07/30/2007    127        Total Cholesterol/HDL:CHD Risk Coronary Heart Disease Risk Table                     Men   Women  1/2 Average Risk   3.4   3.3   ALT 23 12/07/2011   AST 18 12/07/2011   NA 140 12/13/2013   K 4.0  12/13/2013   CL 97 12/13/2013   CREATININE 0.65 12/13/2013   BUN 20 12/13/2013   CO2 31 12/13/2013   TSH 0.074* 04/07/2010   INR 1.0 07/10/2013    BNP (last 3 results) No results for input(s): BNP in the last 8760 hours.  ProBNP (last 3 results) No results for input(s): PROBNP in the last 8760 hours.   Other Studies Reviewed Today:   Echo 8/14  Study Conclusions  - Left ventricle: The cavity size was moderately dilated. Wall thickness was normal. Systolic function was severely reduced. The estimated ejection fraction was in the range of 25% to 30%. Diffuse hypokinesis. - Aortic valve: Moderately calcified with likely moderate AS given degree of LV dysfunction - Mitral valve: Mild regurgitation. - Left atrium: The atrium was mildly dilated. - Atrial septum: No defect or patent foramen ovale was identified.   Venous duplex 8/14 no DVT   Cath 07/13/13 by Dr Burt Knack no significant AS and no CAD  Left mainstem: No stenosis, arises from left cusp.  Left anterior descending (LAD): Patent to the distal anterior wall. No significant stenosis throughout  Left circumflex (LCx): Widely patent. OM2 and OM3 are the major branches and they have no stenosis.  Right coronary artery (RCA): normal caliber vessel with mild 20-30% mid-vessel stenosis. PDA is large without significant disease.  Left ventriculography: Left ventricular systolic function is moderately depressed (  global). The LVEF is estimated at 35-40%.  Final Conclusions:  1. Minimal nonobstructive CAD  2. No significant aortic stenosis (no transvalvular gradient with simultaneous pressures)  3. Moderately severe global LV dysfunction  Recommendations: med Rx for cardiomyopathy.   Right heart pressures ok with no pulmonary hypertension  Hemodynamics  RA 9  RV 36/9  PA 40/14 mean 27  PCWP 17  LV 122/11  AO 122/74 mean 89  Oxygen saturations:  PA 63  AO 91  Cardiac Output (Fick) 4.4  Cardiac  Index (Fick) 2.63     Assessment/Plan: 1. Aortic stenosis - not significant based on cardiac cath from 2014 but she has symptoms - will get her echo updated.   2. HTN - BP higher for me. She does not check at home. May need to restart Losartan.  3. NICM - last EF of 25% - having more dyspnea, dizziness and some chest tightness - mild nonobstructive CAD per cath from 2014. Needs echo updated. Not really on ideal CHF regimen.   4. COPD/chronic DOE - on oxygen. Followed by pulmonary. Endorsing more dyspnea on exertion  5. Situation stress - she is primary caregiver to her husband. This will not be ideal.   Current medicines are reviewed with the patient today.  The patient does not have concerns regarding medicines other than what has been noted above.  The following changes have been made:  See above.  Labs/ tests ordered today include:    Orders Placed This Encounter  Procedures  . EKG 12-Lead  . ECHOCARDIOGRAM COMPLETE     Disposition:   FU with Dr. Johnsie Cancel.   Patient is agreeable to this plan and will call if any problems develop in the interim.   Signed: Burtis Junes, RN, ANP-C 11/13/2015 11:20 AM  [facility] [address] [place], [place]  [address] Phone: [phone] Fax: [phone]

## 2015-11-13 NOTE — Patient Instructions (Addendum)
We will be checking the following labs today - NONE   Medication Instructions:    Continue with your current medicines. BUT  I have refilled the atenolol today.     Testing/Procedures To Be Arranged:  Echocardiogram  Follow-Up:   See Dr. Johnsie Cancel for follow up after echo - discuss medical therapy    Other Special Instructions:   N/A    If you need a refill on your cardiac medications before your next appointment, please call your pharmacy.   Call the [facility] office at [phone] if you have any questions, problems or concerns.

## 2015-11-21 DIAGNOSIS — Z961 Presence of intraocular lens: Secondary | ICD-10-CM | POA: Diagnosis not present

## 2015-11-28 ENCOUNTER — Other Ambulatory Visit: Payer: Self-pay

## 2015-11-28 ENCOUNTER — Ambulatory Visit (HOSPITAL_COMMUNITY): Payer: Medicare Other | Attending: Cardiovascular Disease

## 2015-11-28 DIAGNOSIS — E785 Hyperlipidemia, unspecified: Secondary | ICD-10-CM | POA: Insufficient documentation

## 2015-11-28 DIAGNOSIS — I350 Nonrheumatic aortic (valve) stenosis: Secondary | ICD-10-CM

## 2015-12-10 ENCOUNTER — Telehealth: Payer: Self-pay | Admitting: Internal Medicine

## 2015-12-10 ENCOUNTER — Other Ambulatory Visit: Payer: Self-pay | Admitting: Internal Medicine

## 2015-12-10 NOTE — Telephone Encounter (Signed)
Spoke with pharmacy and they have received the rx for budesonide. Pt advised. No further questions or concerns.

## 2015-12-16 ENCOUNTER — Encounter: Payer: Self-pay | Admitting: Internal Medicine

## 2015-12-16 ENCOUNTER — Ambulatory Visit (INDEPENDENT_AMBULATORY_CARE_PROVIDER_SITE_OTHER): Payer: Medicare Other | Admitting: Internal Medicine

## 2015-12-16 VITALS — BP 108/58 | HR 83 | Ht 62.0 in | Wt 133.0 lb

## 2015-12-16 DIAGNOSIS — J449 Chronic obstructive pulmonary disease, unspecified: Secondary | ICD-10-CM | POA: Diagnosis not present

## 2015-12-16 DIAGNOSIS — J45909 Unspecified asthma, uncomplicated: Secondary | ICD-10-CM

## 2015-12-16 DIAGNOSIS — I5032 Chronic diastolic (congestive) heart failure: Secondary | ICD-10-CM

## 2015-12-16 MED ORDER — PREDNISONE 10 MG PO TABS: 10.0000 mg | ORAL_TABLET | Freq: Every day | ORAL | Status: DC

## 2015-12-16 NOTE — Patient Instructions (Signed)
Script sent refilling prednisone  Please call as needed
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
## 2015-12-17 NOTE — Assessment & Plan Note (Signed)
Moderate persistent without acute exacerbation at this visit. Depends on maintenance prednisone which was discussed, oxygen mainly for sleep. Plan-refill prednisone

## 2015-12-17 NOTE — Assessment & Plan Note (Signed)
Discussed improved cardiac function as indicated by recent echocardiogram. Discussed interaction of heart and lungs contributing to dyspnea.

## 2015-12-29 ENCOUNTER — Other Ambulatory Visit: Payer: Self-pay | Admitting: Internal Medicine

## 2015-12-31 ENCOUNTER — Telehealth: Payer: Self-pay | Admitting: Internal Medicine

## 2015-12-31 MED ORDER — BUDESONIDE 0.25 MG/2ML IN SUSP: RESPIRATORY_TRACT | Status: AC

## 2015-12-31 MED ORDER — FORMOTEROL FUMARATE 20 MCG/2ML IN NEBU: INHALATION_SOLUTION | RESPIRATORY_TRACT | Status: DC

## 2015-12-31 NOTE — Telephone Encounter (Signed)
Called and spoke with the pharmacist. She states that the rx needs to be for the performist. I explained to her that I would send the rx today with the diagnosis code. She voiced understanding and had no further questions. Rx has been sent. Nothing further needed.

## 2015-12-31 NOTE — Telephone Encounter (Signed)
Called and spoke with the pt. She states that the performist that was called in for her on 12/30/15 can not be filled due to a code. I explained to her that I would contact the pharmacy and discuss this in further detail. She voiced understanding and had no further questions.   I called the [facility] on [address] and spoke with [name]. He states that in order for the rx to be filled it must have a diagnosis code. I explained to him that I would resend the medication with the code. He voiced understanding and had no further questions.   Called and spoke with pt. Informed her that the medication would be sent today. She voiced understanding and had no further questions.

## 2016-01-07 NOTE — Progress Notes (Signed)
Patient ID: [name], female   DOB: 02-Jul-1933, 82 y.o.   MRN: [id number]    CARDIOLOGY OFFICE NOTE  Date:  01/07/2016    [name] Date of Birth: 05/16/1933 Medical Record [id number]  PCP:  Lillard Anes, MD  Cardiologist:  Johnsie Cancel    No chief complaint on file.   History of Present Illness: [name] is a 82 y.o. female  With  a history of mild AS, PAF, chronic dyspnea/COPD and chronic back issues. She is oxygen dependent.  EF in the 25% range.    Comes back today. Here alone. She was not able to come earlier due to her husband having had a stroke. She is now the total caregiver. Kids not really helping but she has a grand daughter that seems to be helping out. Little chest discomfort - she attributes this to her lungs. She is chronically short of breath and on oxygen. Thinks her breathing may be worse. Some dizziness. No syncope. She is fatigued but sounds like she has a lot on her plate at home. She does not check her BP at home. Labs are checked by PCP.   Echo 11/28/15  Mean gradient AV only 19 mmHg  Study Conclusions  - Left ventricle: The cavity size was normal. Wall thickness was  normal. Systolic function was normal. The estimated ejection  fraction was in the range of 55% to 60%. Wall motion was normal;  there were no regional wall motion abnormalities. Features are  consistent with a pseudonormal left ventricular filling pattern,  with concomitant abnormal relaxation and increased filling  pressure (grade 2 diastolic dysfunction). - Aortic valve: There was mild stenosis.  Impressions:  - Compared to the report from 2014, there is marked improvement in  LV function.  Past Medical History  Diagnosis Date  . Aortic stenosis     mild by echo (2008)  . Paroxysmal atrial fibrillation ([facility]) 11/08  . Allergic rhinitis     takes Mucinex daily  . Rheumatoid arthritis(714.0)   . Stasis dermatitis   . Multiple sclerosis ([facility])   . Asthma     uses Albuterol daily as needed. Pulmicort and Perforomist neb daily  . Bronchitis, chronic obstructive, with exacerbation ([facility])   . COPD (chronic obstructive pulmonary disease) ([facility])     Xopenex and Atrobent neb daily   . Hyperlipidemia     takes Simvastatin daily  . Hypertension     takes Losartan daily  . GERD (gastroesophageal reflux disease)     takes Omeprazole daily  . PONV (postoperative nausea and vomiting)   . Peripheral edema   . Shortness of breath     with exertion  . Pneumonia     hx of-as a baby  . History of bronchitis     many yrs ago  . Joint pain   . Joint swelling   . Chronic back pain     HNP  . Bruises easily   . History of gout     no meds required  . Hemorrhoid   . History of colon polyps   . Cataract     right  . Anxiety     takes Alprazolam daily  . History of staph infection     Past Surgical History  Procedure Laterality Date  . Abdominal hysterectomy    . Lumbar disc surgery  2003    L5  . Breast cyst excision    . Colonoscopy    . Cataract surgery Left   .  Lumbar laminectomy/decompression microdiscectomy Right 12/15/2013    Procedure: LUMBAR LAMINECTOMY/DECOMPRESSION MICRODISCECTOMY 1 LEVEL two/three;  Surgeon: Winfield Cunas, MD;  Location: [facility];  Service: Neurosurgery;  Laterality: Right;  Right L23 microdiskectomy  . Left and right heart catheterization with coronary angiogram N/A 07/13/2013    Procedure: LEFT AND RIGHT HEART CATHETERIZATION WITH CORONARY ANGIOGRAM;  Surgeon: Blane Ohara, MD;  Location: [facility] CATH LAB;  Service: Cardiovascular;  Laterality: N/A;     Medications: Current Outpatient Prescriptions  Medication Sig Dispense Refill  . ALPRAZolam (XANAX) 0.25 MG tablet Take 0.25 mg by mouth daily.    Marland Kitchen atenolol (TENORMIN) 25 MG tablet Take 1 tablet (25 mg total) by mouth daily. 30 tablet 11  . azelastine (ASTELIN) 0.1 % nasal spray 1-2 puffs each nostril once or twice daily, 30 mL prn  . budesonide (PULMICORT)  0.25 MG/2ML nebulizer solution USE 1 VIAL IN NEBULIZER TWICE A DAY 120 mL 6  . formoterol (PERFOROMIST) 20 MCG/2ML nebulizer solution USE 1 VIAL IN NEBULIZER TWICE A DAY 120 mL 6  . guaiFENesin (MUCINEX) 600 MG 12 hr tablet Take 600 mg by mouth daily.    . hydrochlorothiazide 25 MG tablet Take 25 mg by mouth daily.     Marland Kitchen HYDROcodone-acetaminophen (NORCO/VICODIN) 5-325 MG tablet Take 1 tablet by mouth every 6 (six) hours as needed for moderate pain. Reported on 11/13/2015    . ipratropium (ATROVENT) 0.02 % nebulizer solution USE 1 VIAL IN NEBULIZER THREE TIMES A DAY 187.5 mL 6  . levalbuterol (XOPENEX) 0.63 MG/3ML nebulizer solution Take 3 mLs (0.63 mg total) by nebulization every 6 (six) hours as needed for wheezing or shortness of breath. 360 mL 5  . omeprazole (PRILOSEC) 20 MG capsule Take 20 mg by mouth 2 (two) times daily before a meal.     . predniSONE (DELTASONE) 10 MG tablet Take 1 tablet (10 mg total) by mouth daily. 50 tablet 1  . PROAIR HFA 108 (90 BASE) MCG/ACT inhaler INHALE 2 PUFFS BY MOUTH EVERY 6 HOURS AS NEEDED FOR WHEEZING OR SHORTNESS OF BREATH 8.5 Inhaler 2  . simvastatin (ZOCOR) 20 MG tablet Take 20 mg by mouth daily.     . [DISCONTINUED] metoprolol tartrate (LOPRESSOR) 25 MG tablet Take 1 tablet (25 mg total) by mouth 2 (two) times daily. 30 tablet 0  . [DISCONTINUED] pantoprazole (PROTONIX) 40 MG tablet Take 1 tablet (40 mg total) by mouth daily at 12 noon. 10 tablet 0   No current facility-administered medications for this visit.    Allergies: Allergies  Allergen Reactions  . Amoxicillin-Pot Clavulanate     GI upset  . Daliresp [Roflumilast] Other (See Comments)    unknown  . Diltiazem Nausea Only  . Montelukast Sodium Other (See Comments)     flu-like symptoms  . Zafirlukast Other (See Comments)    unknown  . Adhesive [Tape] Rash    Please use paper tape    Social History: The patient  reports that she quit smoking about 27 years ago. Her smoking use included  Cigarettes. She has a 25 pack-year smoking history. She has never used smokeless tobacco. She reports that she does not drink alcohol or use illicit drugs.   Family History: The patient's family history includes Alzheimer's disease in her brother; Asthma in her mother; Colon cancer in her other; Emphysema in her father; Heart disease in her other.   Review of Systems: Please see the history of present illness.   Otherwise, the review of systems  is positive for none.   All other systems are reviewed and negative.   Physical Exam: VS:  There were no vitals taken for this visit. Marland Kitchen  BMI There is no weight on file to calculate BMI.  Wt Readings from Last 3 Encounters:  12/16/15 60.328 kg (133 lb)  11/13/15 59.875 kg (132 lb)  08/16/15 61.78 kg (136 lb 3.2 oz)   BP by me is 140/90.   General: Pleasant. Elderly female who looks chronically ill but in no acute distress.She was 146 pounds one year ago - now down to 132.  HEENT: Normal. Neck: Supple, no JVD, carotid bruits, or masses noted.  Cardiac: Regular rate and rhythm. Harsh murmur of AS noted.  No edema.  Respiratory:  Lungs are fairly clear to auscultation bilaterally with normal work of breathing.  GI: Soft and nontender.  MS: No deformity or atrophy. Gait and ROM intact. Skin: Warm and dry. Color is normal.  Neuro:  Strength and sensation are intact and no gross focal deficits noted.  Psych: Alert, appropriate and with normal affect.   LABORATORY DATA:  EKG:  EKG is ordered today. This demonstrates NSR with anterior infarct, poor R wave progression.  Lab Results  Component Value Date   WBC 11.5* 12/13/2013   HGB 13.5 12/13/2013   HCT 40.4 12/13/2013   PLT 226 12/13/2013   GLUCOSE 103* 12/13/2013   CHOL  07/30/2007    195        ATP III CLASSIFICATION:  <200     mg/dL   Desirable  200-239  mg/dL   Borderline High  >=240    mg/dL   High   TRIG 143 07/30/2007   HDL 39* 07/30/2007   LDLCALC * 07/30/2007    127          Total Cholesterol/HDL:CHD Risk Coronary Heart Disease Risk Table                     Men   Women  1/2 Average Risk   3.4   3.3   ALT 23 12/07/2011   AST 18 12/07/2011   NA 140 12/13/2013   K 4.0 12/13/2013   CL 97 12/13/2013   CREATININE 0.65 12/13/2013   BUN 20 12/13/2013   CO2 31 12/13/2013   TSH 0.074* 04/07/2010   INR 1.0 07/10/2013    BNP (last 3 results) No results for input(s): BNP in the last 8760 hours.  ProBNP (last 3 results) No results for input(s): PROBNP in the last 8760 hours.   Other Studies Reviewed Today:   Echo 8/14  Study Conclusions  - Left ventricle: The cavity size was moderately dilated. Wall thickness was normal. Systolic function was severely reduced. The estimated ejection fraction was in the range of 25% to 30%. Diffuse hypokinesis. - Aortic valve: Moderately calcified with likely moderate AS given degree of LV dysfunction - Mitral valve: Mild regurgitation. - Left atrium: The atrium was mildly dilated. - Atrial septum: No defect or patent foramen ovale was identified.   Venous duplex 8/14 no DVT   Cath 07/13/13 by Dr Burt Knack no significant AS and no CAD  Left mainstem: No stenosis, arises from left cusp.  Left anterior descending (LAD): Patent to the distal anterior wall. No significant stenosis throughout  Left circumflex (LCx): Widely patent. OM2 and OM3 are the major branches and they have no stenosis.  Right coronary artery (RCA): normal caliber vessel with mild 20-30% mid-vessel stenosis. PDA is large without significant disease.  Left ventriculography: Left ventricular systolic function is moderately depressed (global). The LVEF is estimated at 35-40%.  Final Conclusions:  1. Minimal nonobstructive CAD  2. No significant aortic stenosis (no transvalvular gradient with simultaneous pressures)  3. Moderately severe global LV dysfunction  Recommendations: med Rx for cardiomyopathy.   Right heart pressures ok with no  pulmonary hypertension  Hemodynamics  RA 9  RV 36/9  PA 40/14 mean 27  PCWP 17  LV 122/11  AO 122/74 mean 89  Oxygen saturations:  PA 63  AO 91  Cardiac Output (Fick) 4.4  Cardiac Index (Fick) 2.63     Assessment/Plan: 1. Aortic stenosis - not significant based on cardiac cath from 2014  Mild by recent echo stable   2. HTN - BP higher for me. She does not check at home. May need to restart Losartan.  3. NICM -EF improved by recent echo dyspnea more related to lung disease   4. COPD/chronic DOE - on oxygen. Followed by pulmonary. Endorsing more dyspnea on exertion  5. Situation stress - she is primary caregiver to her husband. This will not be ideal.   Current medicines are reviewed with the patient today.  The patient does not have concerns regarding medicines other than what has been noted above.   Jenkins Rouge         This encounter was created in error - please disregard.

## 2016-01-09 DIAGNOSIS — M189 Osteoarthritis of first carpometacarpal joint, unspecified: Secondary | ICD-10-CM | POA: Diagnosis not present

## 2016-01-09 DIAGNOSIS — Z6824 Body mass index (BMI) 24.0-24.9, adult: Secondary | ICD-10-CM | POA: Diagnosis not present

## 2016-01-10 ENCOUNTER — Encounter: Payer: Medicare Other | Admitting: Cardiovascular Disease

## 2016-01-27 DIAGNOSIS — A6920 Lyme disease, unspecified: Secondary | ICD-10-CM | POA: Diagnosis not present

## 2016-01-27 DIAGNOSIS — Z6824 Body mass index (BMI) 24.0-24.9, adult: Secondary | ICD-10-CM | POA: Diagnosis not present

## 2016-02-10 DIAGNOSIS — E46 Unspecified protein-calorie malnutrition: Secondary | ICD-10-CM | POA: Diagnosis not present

## 2016-02-10 DIAGNOSIS — G35 Multiple sclerosis: Secondary | ICD-10-CM | POA: Diagnosis not present

## 2016-02-10 DIAGNOSIS — Z6824 Body mass index (BMI) 24.0-24.9, adult: Secondary | ICD-10-CM | POA: Diagnosis not present

## 2016-02-10 DIAGNOSIS — M47817 Spondylosis without myelopathy or radiculopathy, lumbosacral region: Secondary | ICD-10-CM | POA: Diagnosis not present

## 2016-02-12 DIAGNOSIS — E785 Hyperlipidemia, unspecified: Secondary | ICD-10-CM | POA: Diagnosis not present

## 2016-02-12 DIAGNOSIS — I10 Essential (primary) hypertension: Secondary | ICD-10-CM | POA: Diagnosis not present

## 2016-02-12 DIAGNOSIS — J449 Chronic obstructive pulmonary disease, unspecified: Secondary | ICD-10-CM | POA: Diagnosis not present

## 2016-02-12 DIAGNOSIS — I350 Nonrheumatic aortic (valve) stenosis: Secondary | ICD-10-CM | POA: Diagnosis not present

## 2016-02-12 DIAGNOSIS — E46 Unspecified protein-calorie malnutrition: Secondary | ICD-10-CM | POA: Diagnosis not present

## 2016-02-12 DIAGNOSIS — I739 Peripheral vascular disease, unspecified: Secondary | ICD-10-CM | POA: Diagnosis not present

## 2016-02-12 DIAGNOSIS — I400 Infective myocarditis: Secondary | ICD-10-CM | POA: Diagnosis not present

## 2016-02-12 DIAGNOSIS — G35 Multiple sclerosis: Secondary | ICD-10-CM | POA: Diagnosis not present

## 2016-02-12 DIAGNOSIS — Z6825 Body mass index (BMI) 25.0-25.9, adult: Secondary | ICD-10-CM | POA: Diagnosis not present

## 2016-02-17 DIAGNOSIS — M810 Age-related osteoporosis without current pathological fracture: Secondary | ICD-10-CM | POA: Diagnosis not present

## 2016-02-21 ENCOUNTER — Ambulatory Visit (INDEPENDENT_AMBULATORY_CARE_PROVIDER_SITE_OTHER): Payer: Medicare Other | Admitting: Cardiovascular Disease

## 2016-02-21 ENCOUNTER — Encounter: Payer: Self-pay | Admitting: Cardiovascular Disease

## 2016-02-21 VITALS — BP 120/70 | HR 67 | Ht 60.0 in | Wt 131.0 lb

## 2016-02-21 DIAGNOSIS — I10 Essential (primary) hypertension: Secondary | ICD-10-CM

## 2016-02-21 NOTE — Progress Notes (Signed)
Patient ID: [name], female   DOB: 1933/06/14, 82 y.o.   MRN: [id number]     CARDIOLOGY OFFICE NOTE  Date:  02/21/2016    [name] Date of Birth: November 26, 1932 Medical Record [id number]  PCP:  Lillard Anes, MD  Cardiologist:  Johnsie Cancel    Chief Complaint  Patient presents with  . Hypertension    SOB    History of Present Illness: [name] is a 82 y.o. female who presents today for a one year check.    She has a history of mild AS, PAF, chronic dyspnea/COPD and chronic back issues. She is oxygen dependent.  EF in the 25% range.   Last seen by PA 10/2015   Husband had a stroke. She is now the total caregiver. Kids not really helping but she has a grand daughter that seems to be helping out. Little chest discomfort - she attributes this to her lungs. She is chronically short of breath and on oxygen. Thinks her breathing may be worse. Some dizziness. No syncope. She is fatigued but sounds like she has a lot on her plate at home. She does not check her BP at home. Labs are checked by PCP.   Updated echo 11/2015 sowed mild AS and improved EF wit hmean gradient only 18 mmHg  Study Conclusions  - Left ventricle: The cavity size was normal. Wall thickness was  normal. Systolic function was normal. The estimated ejection  fraction was in the range of 55% to 60%. Wall motion was normal;  there were no regional wall motion abnormalities. Features are  consistent with a pseudonormal left ventricular filling pattern,  with concomitant abnormal relaxation and increased filling  pressure (grade 2 diastolic dysfunction). - Aortic valve: There was mild stenosis.  Impressions:  - Compared to the report from 2014, there is marked improvement in  LV function.  Still stressed with caring for 6 animals her grand daughter and husband.    Past Medical History  Diagnosis Date  . Aortic stenosis     mild by echo (2008)  . Paroxysmal atrial fibrillation ([place]) 11/08    . Allergic rhinitis     takes Mucinex daily  . Rheumatoid arthritis(714.0)   . Stasis dermatitis   . Multiple sclerosis ([place])   . Asthma     uses Albuterol daily as needed. Pulmicort and Perforomist neb daily  . Bronchitis, chronic obstructive, with exacerbation ([place])   . COPD (chronic obstructive pulmonary disease) ([facility])     Xopenex and Atrobent neb daily   . Hyperlipidemia     takes Simvastatin daily  . Hypertension     takes Losartan daily  . GERD (gastroesophageal reflux disease)     takes Omeprazole daily  . PONV (postoperative nausea and vomiting)   . Peripheral edema   . Shortness of breath     with exertion  . Pneumonia     hx of-as a baby  . History of bronchitis     many yrs ago  . Joint pain   . Joint swelling   . Chronic back pain     HNP  . Bruises easily   . History of gout     no meds required  . Hemorrhoid   . History of colon polyps   . Cataract     right  . Anxiety     takes Alprazolam daily  . History of staph infection     Past Surgical History  Procedure Laterality Date  .  Abdominal hysterectomy    . Lumbar disc surgery  2003    L5  . Breast cyst excision    . Colonoscopy    . Cataract surgery Left   . Lumbar laminectomy/decompression microdiscectomy Right 12/15/2013    Procedure: LUMBAR LAMINECTOMY/DECOMPRESSION MICRODISCECTOMY 1 LEVEL two/three;  Surgeon: Winfield Cunas, MD;  Location: [facility];  Service: Neurosurgery;  Laterality: Right;  Right L23 microdiskectomy  . Left and right heart catheterization with coronary angiogram N/A 07/13/2013    Procedure: LEFT AND RIGHT HEART CATHETERIZATION WITH CORONARY ANGIOGRAM;  Surgeon: Blane Ohara, MD;  Location: [facility] CATH LAB;  Service: Cardiovascular;  Laterality: N/A;     Medications: Current Outpatient Prescriptions  Medication Sig Dispense Refill  . ALPRAZolam (XANAX) 0.25 MG tablet Take 0.25 mg by mouth daily.    Marland Kitchen atenolol (TENORMIN) 25 MG tablet Take 1 tablet (25 mg total) by  mouth daily. 30 tablet 11  . azelastine (ASTELIN) 0.1 % nasal spray 1-2 puffs each nostril once or twice daily, 30 mL prn  . budesonide (PULMICORT) 0.25 MG/2ML nebulizer solution USE 1 VIAL IN NEBULIZER TWICE A DAY 120 mL 6  . Calcium Carb-Cholecalciferol (CALCIUM 600/VITAMIN D3) 600-800 MG-UNIT TABS Take 1 tablet by mouth daily.    . Calcium Carbonate (CALCIUM 600 PO) Take 1 tablet by mouth daily.    . formoterol (PERFOROMIST) 20 MCG/2ML nebulizer solution USE 1 VIAL IN NEBULIZER TWICE A DAY 120 mL 6  . gabapentin (NEURONTIN) 300 MG capsule Take 300 mg by mouth at bedtime.  3  . guaiFENesin (MUCINEX) 600 MG 12 hr tablet Take 600 mg by mouth daily.    . hydrochlorothiazide 25 MG tablet Take 25 mg by mouth daily.     Marland Kitchen HYDROcodone-acetaminophen (NORCO/VICODIN) 5-325 MG tablet Take 1 tablet by mouth every 6 (six) hours as needed for moderate pain. Reported on 11/13/2015    . ipratropium (ATROVENT) 0.02 % nebulizer solution USE 1 VIAL IN NEBULIZER THREE TIMES A DAY 187.5 mL 6  . levalbuterol (XOPENEX) 0.63 MG/3ML nebulizer solution Take 3 mLs (0.63 mg total) by nebulization every 6 (six) hours as needed for wheezing or shortness of breath. 360 mL 5  . omeprazole (PRILOSEC) 20 MG capsule Take 20 mg by mouth 2 (two) times daily before a meal.     . predniSONE (DELTASONE) 10 MG tablet Take 1 tablet (10 mg total) by mouth daily. 50 tablet 1  . PROAIR HFA 108 (90 BASE) MCG/ACT inhaler INHALE 2 PUFFS BY MOUTH EVERY 6 HOURS AS NEEDED FOR WHEEZING OR SHORTNESS OF BREATH 8.5 Inhaler 2  . simvastatin (ZOCOR) 20 MG tablet Take 20 mg by mouth daily.     . [DISCONTINUED] metoprolol tartrate (LOPRESSOR) 25 MG tablet Take 1 tablet (25 mg total) by mouth 2 (two) times daily. 30 tablet 0  . [DISCONTINUED] pantoprazole (PROTONIX) 40 MG tablet Take 1 tablet (40 mg total) by mouth daily at 12 noon. 10 tablet 0   No current facility-administered medications for this visit.    Allergies: Allergies  Allergen Reactions    . Amoxicillin-Pot Clavulanate     GI upset  . Daliresp [Roflumilast] Other (See Comments)    unknown  . Diltiazem Nausea Only  . Montelukast Sodium Other (See Comments)     flu-like symptoms  . Zafirlukast Other (See Comments)    unknown  . Adhesive [Tape] Rash    Please use paper tape    Social History: The patient  reports that she quit smoking  about 27 years ago. Her smoking use included Cigarettes. She has a 25 pack-year smoking history. She has never used smokeless tobacco. She reports that she does not drink alcohol or use illicit drugs.   Family History: The patient's family history includes Alzheimer's disease in her brother; Asthma in her mother; Colon cancer in her other; Emphysema in her father; Heart disease in her other.   Review of Systems: Please see the history of present illness.   Otherwise, the review of systems is positive for none.   All other systems are reviewed and negative.   Physical Exam: VS:  BP 120/70 mmHg  Pulse 67  Ht 5' (1.524 m)  Wt 59.421 kg (131 lb)  BMI 25.58 kg/m2  SpO2 90% .  BMI Body mass index is 25.58 kg/(m^2).  Wt Readings from Last 3 Encounters:  02/21/16 59.421 kg (131 lb)  12/16/15 60.328 kg (133 lb)  11/13/15 59.875 kg (132 lb)   BP by me is 140/90.   General: Pleasant. Elderly female who looks chronically ill but in no acute distress.She was 146 pounds one year ago - now down to 132.  HEENT: Normal. Neck: Supple, no JVD,  Right carotid bruits, or masses noted.  Cardiac: Regular rate and rhythm. Harsh murmur of AS noted.  No edema.  Respiratory:  Lungs are fairly clear to auscultation bilaterally with normal work of breathing.  GI: Soft and nontender.  MS: No deformity or atrophy. Gait and ROM intact. Skin: Warm and dry. Color is normal.  Neuro:  Strength and sensation are intact and no gross focal deficits noted.  Psych: Alert, appropriate and with normal affect. Arthritic changes in hands   LABORATORY DATA:  EKG:   10/2015 This demonstrates NSR with anterior infarct, poor R wave progression. 02/21/16 SR frequent PAC;s low voltage   Lab Results  Component Value Date   WBC 11.5* 12/13/2013   HGB 13.5 12/13/2013   HCT 40.4 12/13/2013   PLT 226 12/13/2013   GLUCOSE 103* 12/13/2013   CHOL  07/30/2007    195        ATP III CLASSIFICATION:  <200     mg/dL   Desirable  200-239  mg/dL   Borderline High  >=240    mg/dL   High   TRIG 143 07/30/2007   HDL 39* 07/30/2007   LDLCALC * 07/30/2007    127        Total Cholesterol/HDL:CHD Risk Coronary Heart Disease Risk Table                     Men   Women  1/2 Average Risk   3.4   3.3   ALT 23 12/07/2011   AST 18 12/07/2011   NA 140 12/13/2013   K 4.0 12/13/2013   CL 97 12/13/2013   CREATININE 0.65 12/13/2013   BUN 20 12/13/2013   CO2 31 12/13/2013   TSH 0.074* 04/07/2010   INR 1.0 07/10/2013    BNP (last 3 results) No results for input(s): BNP in the last 8760 hours.  ProBNP (last 3 results) No results for input(s): PROBNP in the last 8760 hours.   Other Studies Reviewed Today:   Echo 8/14  Study Conclusions  - Left ventricle: The cavity size was moderately dilated. Wall thickness was normal. Systolic function was severely reduced. The estimated ejection fraction was in the range of 25% to 30%. Diffuse hypokinesis. - Aortic valve: Moderately calcified with likely moderate AS given degree of LV dysfunction -  Mitral valve: Mild regurgitation. - Left atrium: The atrium was mildly dilated. - Atrial septum: No defect or patent foramen ovale was identified.   Venous duplex 8/14 no DVT   Cath 07/13/13 by Dr Burt Knack no significant AS and no CAD  Left mainstem: No stenosis, arises from left cusp.  Left anterior descending (LAD): Patent to the distal anterior wall. No significant stenosis throughout  Left circumflex (LCx): Widely patent. OM2 and OM3 are the major branches and they have no stenosis.  Right coronary artery (RCA): normal  caliber vessel with mild 20-30% mid-vessel stenosis. PDA is large without significant disease.  Left ventriculography: Left ventricular systolic function is moderately depressed (global). The LVEF is estimated at 35-40%.  Final Conclusions:  1. Minimal nonobstructive CAD  2. No significant aortic stenosis (no transvalvular gradient with simultaneous pressures)  3. Moderately severe global LV dysfunction  Recommendations: med Rx for cardiomyopathy.   Right heart pressures ok with no pulmonary hypertension  Hemodynamics  RA 9  RV 36/9  PA 40/14 mean 27  PCWP 17  LV 122/11  AO 122/74 mean 89  Oxygen saturations:  PA 63  AO 91  Cardiac Output (Fick) 4.4  Cardiac Index (Fick) 2.63     Assessment/Plan: 1. Aortic stenosis - not significant based on cardiac cath from 2014 mild by recent echo   2. HTN - BP higher for me. She does not check at home. May need to restart Losartan.  3. NICM - marked improvement based on latest echo continue medical Rx   4. COPD/chronic DOE - on oxygen. Followed by pulmonary. No active wheezing   5. Situation stress - she is primary caregiver to her husband. This will not be ideal.   6. PAC;s frequent in office today and not MAT  Continue beta blocker   Current medicines are reviewed with the patient today.  The patient does not have concerns regarding medicines other than what has been noted above.   Jenkins Rouge

## 2016-02-21 NOTE — Patient Instructions (Signed)

## 2016-02-25 ENCOUNTER — Telehealth: Payer: Self-pay | Admitting: Internal Medicine

## 2016-02-25 ENCOUNTER — Other Ambulatory Visit: Payer: Self-pay | Admitting: Internal Medicine

## 2016-02-25 MED ORDER — IPRATROPIUM BROMIDE 0.02 % IN SOLN: 0.5000 mg | Freq: Three times a day (TID) | RESPIRATORY_TRACT | Status: DC | PRN

## 2016-02-25 MED ORDER — PREDNISONE 10 MG PO TABS: ORAL_TABLET | ORAL | Status: DC

## 2016-02-25 NOTE — Telephone Encounter (Signed)
Spoke with patient-she is aware that [facility] just sent refill request today and we sent back approval. Nothing more needed at this time.

## 2016-02-25 NOTE — Telephone Encounter (Signed)
Spoke with pharmacist, states that ipratropium rx needs to be resent to pharmacy with a dx code on rx.  This has been done.  Nothing further needed.

## 2016-02-25 NOTE — Telephone Encounter (Signed)
Suggest prednisone  40 mg x 2 days, 30 mg x 2 days, 20 mg x 2 days, then ba k to 10 mg daily  Ok to refill her pred script if she needs

## 2016-02-25 NOTE — Telephone Encounter (Signed)
Spoke with pt. Reports increased SOB for 3 days. Denies chest tightness, wheezing or coughing. Feels that she needs to increase her prednisone. She is currently using prednisone 10mg  daily. She would like CY's recommendations. CY - please advise. Thanks.  Allergies  Allergen Reactions  . Amoxicillin-Pot Clavulanate     GI upset  . Daliresp [Roflumilast] Other (See Comments)    unknown  . Diltiazem Nausea Only  . Montelukast Sodium Other (See Comments)     flu-like symptoms  . Zafirlukast Other (See Comments)    unknown  . Adhesive [Tape] Rash    Please use paper tape   Current Outpatient Prescriptions on File Prior to Visit  Medication Sig Dispense Refill  . ALPRAZolam (XANAX) 0.25 MG tablet Take 0.25 mg by mouth daily.    Marland Kitchen atenolol (TENORMIN) 25 MG tablet Take 1 tablet (25 mg total) by mouth daily. 30 tablet 11  . azelastine (ASTELIN) 0.1 % nasal spray 1-2 puffs each nostril once or twice daily, 30 mL prn  . budesonide (PULMICORT) 0.25 MG/2ML nebulizer solution USE 1 VIAL IN NEBULIZER TWICE A DAY 120 mL 6  . Calcium Carb-Cholecalciferol (CALCIUM 600/VITAMIN D3) 600-800 MG-UNIT TABS Take 1 tablet by mouth daily.    . Calcium Carbonate (CALCIUM 600 PO) Take 1 tablet by mouth daily.    . formoterol (PERFOROMIST) 20 MCG/2ML nebulizer solution USE 1 VIAL IN NEBULIZER TWICE A DAY 120 mL 6  . gabapentin (NEURONTIN) 300 MG capsule Take 300 mg by mouth at bedtime.  3  . guaiFENesin (MUCINEX) 600 MG 12 hr tablet Take 600 mg by mouth daily.    . hydrochlorothiazide 25 MG tablet Take 25 mg by mouth daily.     Marland Kitchen HYDROcodone-acetaminophen (NORCO/VICODIN) 5-325 MG tablet Take 1 tablet by mouth every 6 (six) hours as needed for moderate pain. Reported on 11/13/2015    . ipratropium (ATROVENT) 0.02 % nebulizer solution USE 1 VIAL IN NEBULIZER THREE TIMES A DAY 187.5 mL 6  . levalbuterol (XOPENEX) 0.63 MG/3ML nebulizer solution Take 3 mLs (0.63 mg total) by nebulization every 6 (six) hours as  needed for wheezing or shortness of breath. 360 mL 5  . omeprazole (PRILOSEC) 20 MG capsule Take 20 mg by mouth 2 (two) times daily before a meal.     . predniSONE (DELTASONE) 10 MG tablet Take 1 tablet (10 mg total) by mouth daily. 50 tablet 1  . PROAIR HFA 108 (90 BASE) MCG/ACT inhaler INHALE 2 PUFFS BY MOUTH EVERY 6 HOURS AS NEEDED FOR WHEEZING OR SHORTNESS OF BREATH 8.5 Inhaler 2  . simvastatin (ZOCOR) 20 MG tablet Take 20 mg by mouth daily.     . [DISCONTINUED] metoprolol tartrate (LOPRESSOR) 25 MG tablet Take 1 tablet (25 mg total) by mouth 2 (two) times daily. 30 tablet 0  . [DISCONTINUED] pantoprazole (PROTONIX) 40 MG tablet Take 1 tablet (40 mg total) by mouth daily at 12 noon. 10 tablet 0   No current facility-administered medications on file prior to visit.

## 2016-02-25 NOTE — Telephone Encounter (Signed)
Spoke with pt. She is aware of CY's recommendation. Rx has been sent in. Nothing further was needed.  

## 2016-03-10 DIAGNOSIS — S51809A Unspecified open wound of unspecified forearm, initial encounter: Secondary | ICD-10-CM | POA: Diagnosis not present

## 2016-03-10 DIAGNOSIS — Z6823 Body mass index (BMI) 23.0-23.9, adult: Secondary | ICD-10-CM | POA: Diagnosis not present

## 2016-03-10 DIAGNOSIS — S81802A Unspecified open wound, left lower leg, initial encounter: Secondary | ICD-10-CM | POA: Diagnosis not present

## 2016-03-10 DIAGNOSIS — L03119 Cellulitis of unspecified part of limb: Secondary | ICD-10-CM | POA: Diagnosis not present

## 2016-03-14 ENCOUNTER — Other Ambulatory Visit: Payer: Self-pay | Admitting: Internal Medicine

## 2016-03-17 DIAGNOSIS — S51802D Unspecified open wound of left forearm, subsequent encounter: Secondary | ICD-10-CM | POA: Diagnosis not present

## 2016-03-17 DIAGNOSIS — Z6823 Body mass index (BMI) 23.0-23.9, adult: Secondary | ICD-10-CM | POA: Diagnosis not present

## 2016-03-17 NOTE — Telephone Encounter (Signed)
Pt still does not have refill for prednisone per her pharmacy

## 2016-04-07 DIAGNOSIS — G5600 Carpal tunnel syndrome, unspecified upper limb: Secondary | ICD-10-CM | POA: Diagnosis not present

## 2016-04-07 DIAGNOSIS — Z6823 Body mass index (BMI) 23.0-23.9, adult: Secondary | ICD-10-CM | POA: Diagnosis not present

## 2016-04-16 DIAGNOSIS — G5612 Other lesions of median nerve, left upper limb: Secondary | ICD-10-CM | POA: Diagnosis not present

## 2016-04-16 DIAGNOSIS — G5611 Other lesions of median nerve, right upper limb: Secondary | ICD-10-CM | POA: Diagnosis not present

## 2016-04-20 ENCOUNTER — Telehealth: Payer: Self-pay | Admitting: Internal Medicine

## 2016-04-20 MED ORDER — PREDNISONE 10 MG PO TABS: 10.0000 mg | ORAL_TABLET | Freq: Every day | ORAL | 0 refills | Status: DC

## 2016-04-20 NOTE — Telephone Encounter (Signed)
Spoke with pt. States that she is having issues with her breathing and allergies. Reports SOB, wheezing, sneezing and runny nose. Denies chest tightness, coughing or fever. Symptoms started 1 week ago. Would like to have something called in if possible. CY - please advise. Thanks.  Allergies  Allergen Reactions  . Amoxicillin-Pot Clavulanate     GI upset  . Daliresp [Roflumilast] Other (See Comments)    unknown  . Diltiazem Nausea Only  . Montelukast Sodium Other (See Comments)     flu-like symptoms  . Zafirlukast Other (See Comments)    unknown  . Adhesive [Tape] Rash    Please use paper tape   Current Outpatient Prescriptions on File Prior to Visit  Medication Sig Dispense Refill  . ALPRAZolam (XANAX) 0.25 MG tablet Take 0.25 mg by mouth daily.    Marland Kitchen atenolol (TENORMIN) 25 MG tablet Take 1 tablet (25 mg total) by mouth daily. 30 tablet 11  . azelastine (ASTELIN) 0.1 % nasal spray 1-2 puffs each nostril once or twice daily, 30 mL prn  . budesonide (PULMICORT) 0.25 MG/2ML nebulizer solution USE 1 VIAL IN NEBULIZER TWICE A DAY 120 mL 6  . Calcium Carb-Cholecalciferol (CALCIUM 600/VITAMIN D3) 600-800 MG-UNIT TABS Take 1 tablet by mouth daily.    . Calcium Carbonate (CALCIUM 600 PO) Take 1 tablet by mouth daily.    . formoterol (PERFOROMIST) 20 MCG/2ML nebulizer solution USE 1 VIAL IN NEBULIZER TWICE A DAY 120 mL 6  . gabapentin (NEURONTIN) 300 MG capsule Take 300 mg by mouth at bedtime.  3  . guaiFENesin (MUCINEX) 600 MG 12 hr tablet Take 600 mg by mouth daily.    . hydrochlorothiazide 25 MG tablet Take 25 mg by mouth daily.     Marland Kitchen HYDROcodone-acetaminophen (NORCO/VICODIN) 5-325 MG tablet Take 1 tablet by mouth every 6 (six) hours as needed for moderate pain. Reported on 11/13/2015    . ipratropium (ATROVENT) 0.02 % nebulizer solution Take 2.5 mLs (0.5 mg total) by nebulization 3 (three) times daily as needed for wheezing or shortness of breath. DX code J44.9. 187.5 mL 6  .  levalbuterol (XOPENEX) 0.63 MG/3ML nebulizer solution Take 3 mLs (0.63 mg total) by nebulization every 6 (six) hours as needed for wheezing or shortness of breath. 360 mL 5  . omeprazole (PRILOSEC) 20 MG capsule Take 20 mg by mouth 2 (two) times daily before a meal.     . predniSONE (DELTASONE) 10 MG tablet 40 mg x 2 days, 30 mg x 2 days, 20 mg x 2 days, then back to 10 mg daily 20 tablet 0  . predniSONE (DELTASONE) 10 MG tablet TAKE 1 TABLET BY MOUTH DAILY. 50 tablet 1  . PROAIR HFA 108 (90 BASE) MCG/ACT inhaler INHALE 2 PUFFS BY MOUTH EVERY 6 HOURS AS NEEDED FOR WHEEZING OR SHORTNESS OF BREATH 8.5 Inhaler 2  . simvastatin (ZOCOR) 20 MG tablet Take 20 mg by mouth daily.     . [DISCONTINUED] metoprolol tartrate (LOPRESSOR) 25 MG tablet Take 1 tablet (25 mg total) by mouth 2 (two) times daily. 30 tablet 0  . [DISCONTINUED] pantoprazole (PROTONIX) 40 MG tablet Take 1 tablet (40 mg total) by mouth daily at 12 noon. 10 tablet 0   No current facility-administered medications on file prior to visit.

## 2016-04-20 NOTE — Telephone Encounter (Signed)
Spoke with the pt and notified of recs per CDY  She verbalized understanding  Rx was sent to pharm  

## 2016-04-20 NOTE — Telephone Encounter (Signed)
Suggest we refill her prednisone taper. When done, return to maintenance prednisone 10 mg daily.[name]

## 2016-04-22 DIAGNOSIS — Z6823 Body mass index (BMI) 23.0-23.9, adult: Secondary | ICD-10-CM | POA: Diagnosis not present

## 2016-04-22 DIAGNOSIS — G5600 Carpal tunnel syndrome, unspecified upper limb: Secondary | ICD-10-CM | POA: Diagnosis not present

## 2016-04-24 ENCOUNTER — Ambulatory Visit (INDEPENDENT_AMBULATORY_CARE_PROVIDER_SITE_OTHER): Payer: Medicare Other | Admitting: Internal Medicine

## 2016-04-24 ENCOUNTER — Encounter: Payer: Self-pay | Admitting: Internal Medicine

## 2016-04-24 ENCOUNTER — Telehealth: Payer: Self-pay | Admitting: Internal Medicine

## 2016-04-24 VITALS — BP 114/70 | HR 69 | Ht 62.0 in | Wt 125.6 lb

## 2016-04-24 DIAGNOSIS — I480 Paroxysmal atrial fibrillation: Secondary | ICD-10-CM

## 2016-04-24 DIAGNOSIS — J449 Chronic obstructive pulmonary disease, unspecified: Secondary | ICD-10-CM

## 2016-04-24 NOTE — Telephone Encounter (Signed)
Katie please advise what slot on CY schedule we can use for the pt in December for a 4  Month follow up.  thanks

## 2016-04-24 NOTE — Assessment & Plan Note (Signed)
Exam today is consistent with sinus rhythm

## 2016-04-24 NOTE — Telephone Encounter (Signed)
08/25/16 at 11:15am slot for 30 minute OV. Thanks

## 2016-04-24 NOTE — Telephone Encounter (Signed)
Called and [email or web address] for pt to ask for triage so we can schedule her appt in December.

## 2016-04-24 NOTE — Telephone Encounter (Signed)
Pt called back and she is aware of appt scheduled for her on 12/15 at 11:15.
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
## 2016-04-24 NOTE — Assessment & Plan Note (Signed)
Resolving recent exacerbation. She remains steroid dependent, usually 10 mg daily. Her respiratory status can be labile, but I expect her to be able to tolerate necessary carpal tunnel surgery. Meds are okay currently.

## 2016-04-24 NOTE — Patient Instructions (Signed)
Ok to continue maintenance prednisone 10 mg daily, increasing if needed on bad days.  Please call as needed

## 2016-05-05 DIAGNOSIS — G5602 Carpal tunnel syndrome, left upper limb: Secondary | ICD-10-CM | POA: Diagnosis not present

## 2016-05-05 DIAGNOSIS — G5601 Carpal tunnel syndrome, right upper limb: Secondary | ICD-10-CM | POA: Diagnosis not present

## 2016-05-13 DIAGNOSIS — G5601 Carpal tunnel syndrome, right upper limb: Secondary | ICD-10-CM | POA: Diagnosis not present

## 2016-05-20 DIAGNOSIS — Z6822 Body mass index (BMI) 22.0-22.9, adult: Secondary | ICD-10-CM | POA: Diagnosis not present

## 2016-05-20 DIAGNOSIS — J441 Chronic obstructive pulmonary disease with (acute) exacerbation: Secondary | ICD-10-CM | POA: Diagnosis not present

## 2016-05-26 DIAGNOSIS — Z9889 Other specified postprocedural states: Secondary | ICD-10-CM | POA: Diagnosis not present

## 2016-05-26 DIAGNOSIS — Z4789 Encounter for other orthopedic aftercare: Secondary | ICD-10-CM | POA: Diagnosis not present

## 2016-05-27 DIAGNOSIS — J441 Chronic obstructive pulmonary disease with (acute) exacerbation: Secondary | ICD-10-CM | POA: Diagnosis not present

## 2016-06-12 DIAGNOSIS — J441 Chronic obstructive pulmonary disease with (acute) exacerbation: Secondary | ICD-10-CM | POA: Diagnosis not present

## 2016-06-12 DIAGNOSIS — J9690 Respiratory failure, unspecified, unspecified whether with hypoxia or hypercapnia: Secondary | ICD-10-CM | POA: Diagnosis not present

## 2016-06-19 DIAGNOSIS — J441 Chronic obstructive pulmonary disease with (acute) exacerbation: Secondary | ICD-10-CM | POA: Diagnosis not present

## 2016-06-19 DIAGNOSIS — Z23 Encounter for immunization: Secondary | ICD-10-CM | POA: Diagnosis not present

## 2016-06-29 DIAGNOSIS — L909 Atrophic disorder of skin, unspecified: Secondary | ICD-10-CM | POA: Diagnosis not present

## 2016-06-29 DIAGNOSIS — Z6822 Body mass index (BMI) 22.0-22.9, adult: Secondary | ICD-10-CM | POA: Diagnosis not present

## 2016-06-30 ENCOUNTER — Other Ambulatory Visit: Payer: Self-pay | Admitting: Internal Medicine

## 2016-06-30 ENCOUNTER — Telehealth: Payer: Self-pay | Admitting: Internal Medicine

## 2016-06-30 MED ORDER — PREDNISONE 10 MG PO TABS: 10.0000 mg | ORAL_TABLET | Freq: Every day | ORAL | 2 refills | Status: DC

## 2016-06-30 MED ORDER — LEVALBUTEROL HCL 0.63 MG/3ML IN NEBU: 0.6300 mg | INHALATION_SOLUTION | Freq: Four times a day (QID) | RESPIRATORY_TRACT | 5 refills | Status: AC | PRN

## 2016-06-30 MED ORDER — PREDNISONE 10 MG PO TABS: ORAL_TABLET | ORAL | 0 refills | Status: DC

## 2016-06-30 NOTE — Telephone Encounter (Signed)
CY---  The pt called back and stated that the rx for the prednisone that was sent in this morning only gave her 30 tablets.  She was worried since sometimes she has to take an extra prednisone and that she normally gets 50 tablets.  I called the pharmacy and they stated that the rx they received was for 30 tablets with 2 refills.  She stated that we could send in a new RX that has a change in the directions of taking the medication and send it in for 20 more tablets.  Would this be ok?  Please advise. thanks

## 2016-06-30 NOTE — Telephone Encounter (Signed)
Yes- ok to increase pill count. Change sig to " one daily or increase for flare"

## 2016-06-30 NOTE — Telephone Encounter (Signed)
Called and spoke with pt and she is aware of new rx that has been sent in to her pharmacy and she is aware that they will hold this rx until she needs it.

## 2016-06-30 NOTE — Telephone Encounter (Signed)
Spoke with pt and advised that refill for Xopenex and Prednisone was sent to pharmacy.  Nothing further needed at this time.

## 2016-07-09 DIAGNOSIS — Z4789 Encounter for other orthopedic aftercare: Secondary | ICD-10-CM | POA: Diagnosis not present

## 2016-07-09 DIAGNOSIS — G5601 Carpal tunnel syndrome, right upper limb: Secondary | ICD-10-CM | POA: Diagnosis not present

## 2016-07-09 DIAGNOSIS — G5602 Carpal tunnel syndrome, left upper limb: Secondary | ICD-10-CM | POA: Diagnosis not present

## 2016-07-10 ENCOUNTER — Other Ambulatory Visit: Payer: Self-pay | Admitting: Internal Medicine

## 2016-07-10 NOTE — Telephone Encounter (Signed)
Patient is calling for refill budesonide. States she will be out of this medication this weekend.  CB is [phone]. Pharmacy is [facility] in [place].

## 2016-07-20 DIAGNOSIS — S32000A Wedge compression fracture of unspecified lumbar vertebra, initial encounter for closed fracture: Secondary | ICD-10-CM | POA: Diagnosis not present

## 2016-07-24 ENCOUNTER — Telehealth: Payer: Self-pay | Admitting: Internal Medicine

## 2016-07-24 MED ORDER — PREDNISONE 10 MG PO TABS: 10.0000 mg | ORAL_TABLET | Freq: Every day | ORAL | 5 refills | Status: DC

## 2016-07-24 NOTE — Telephone Encounter (Signed)
Called and spoke with pt and she is aware of refill called to the pharmacy for her prednisone. Nothing further is needed.

## 2016-07-27 DIAGNOSIS — S32050D Wedge compression fracture of fifth lumbar vertebra, subsequent encounter for fracture with routine healing: Secondary | ICD-10-CM | POA: Diagnosis not present

## 2016-08-10 DIAGNOSIS — M4850XD Collapsed vertebra, not elsewhere classified, site unspecified, subsequent encounter for fracture with routine healing: Secondary | ICD-10-CM | POA: Diagnosis not present

## 2016-08-18 ENCOUNTER — Telehealth: Payer: Self-pay | Admitting: Internal Medicine

## 2016-08-18 MED ORDER — PREDNISONE 10 MG PO TABS: ORAL_TABLET | ORAL | 0 refills | Status: DC

## 2016-08-18 NOTE — Telephone Encounter (Signed)
Pt was not called this morning as the message was not routed to triage.  Spoke with pt. States that she is having issues with her breathing for the past few days. Reports increased SOB, chest tightness, wheezing, runny nose and sinus congestion at night. Denies cough. States that a few days ago she took 3 tablets of her prednisone 10mg  and she felt a little better. Pt would like CY's recommendations. CY - please advise. Thanks.  Allergies  Allergen Reactions  . Amoxicillin-Pot Clavulanate     GI upset  . Daliresp [Roflumilast] Other (See Comments)    unknown  . Diltiazem Nausea Only  . Montelukast Sodium Other (See Comments)     flu-like symptoms  . Zafirlukast Other (See Comments)    unknown  . Adhesive [Tape] Rash    Please use paper tape   Current Outpatient Prescriptions on File Prior to Visit  Medication Sig Dispense Refill  . ALPRAZolam (XANAX) 0.25 MG tablet Take 0.25 mg by mouth daily.    Marland Kitchen atenolol (TENORMIN) 25 MG tablet Take 1 tablet (25 mg total) by mouth daily. 30 tablet 11  . azelastine (ASTELIN) 0.1 % nasal spray 1-2 puffs each nostril once or twice daily, 30 mL prn  . budesonide (PULMICORT) 0.25 MG/2ML nebulizer solution USE 1 VIAL IN NEBULIZER TWICE A DAY 120 mL 6  . budesonide (PULMICORT) 0.25 MG/2ML nebulizer solution USE 1 VIAL IN NEBULIZER TWICE A DAY 120 mL 6  . Calcium Carb-Cholecalciferol (CALCIUM 600/VITAMIN D3) 600-800 MG-UNIT TABS Take 1 tablet by mouth daily.    . Calcium Carbonate (CALCIUM 600 PO) Take 1 tablet by mouth daily.    . cephALEXin (KEFLEX) 500 MG capsule TAKE 1 CAPSULE BY MOUTH 3 TIMES DAILY FOR 10 DAYS  0  . formoterol (PERFOROMIST) 20 MCG/2ML nebulizer solution USE 1 VIAL IN NEBULIZER TWICE A DAY 120 mL 6  . gabapentin (NEURONTIN) 300 MG capsule Take 300 mg by mouth at bedtime.  3  . guaiFENesin (MUCINEX) 600 MG 12 hr tablet Take 600 mg by mouth daily.    . hydrochlorothiazide 25 MG tablet Take 25 mg by mouth daily.     Marland Kitchen  HYDROcodone-acetaminophen (NORCO) 10-325 MG tablet Take 1 tablet by mouth 4 (four) times daily as needed.  0  . ipratropium (ATROVENT) 0.02 % nebulizer solution Take 2.5 mLs (0.5 mg total) by nebulization 3 (three) times daily as needed for wheezing or shortness of breath. DX code J44.9. 187.5 mL 6  . levalbuterol (XOPENEX) 0.63 MG/3ML nebulizer solution Take 3 mLs (0.63 mg total) by nebulization every 6 (six) hours as needed for wheezing or shortness of breath. 360 mL 5  . omeprazole (PRILOSEC) 20 MG capsule Take 20 mg by mouth 2 (two) times daily before a meal.     . predniSONE (DELTASONE) 10 MG tablet One daily for increase for flare 20 tablet 0  . predniSONE (DELTASONE) 10 MG tablet Take 1 tablet (10 mg total) by mouth daily. May take up to 2 tablets if needed 50 tablet 5  . PROAIR HFA 108 (90 BASE) MCG/ACT inhaler INHALE 2 PUFFS BY MOUTH EVERY 6 HOURS AS NEEDED FOR WHEEZING OR SHORTNESS OF BREATH 8.5 Inhaler 2  . simvastatin (ZOCOR) 20 MG tablet Take 20 mg by mouth daily.     . [DISCONTINUED] metoprolol tartrate (LOPRESSOR) 25 MG tablet Take 1 tablet (25 mg total) by mouth 2 (two) times daily. 30 tablet 0  . [DISCONTINUED] pantoprazole (PROTONIX) 40 MG tablet Take 1 tablet (40  mg total) by mouth daily at 12 noon. 10 tablet 0   No current facility-administered medications on file prior to visit.

## 2016-08-18 NOTE — Telephone Encounter (Signed)
Pt calling again because she has not heard from nurse.[name]

## 2016-08-18 NOTE — Telephone Encounter (Signed)
Prednisone 10 mg # 20, 4 X 2 DAYS, 3 X 2 DAYS, 2 X 2 DAYS, 1 X 2 DAYS  Okay to use her own supply if she has enough, or to call in.

## 2016-08-18 NOTE — Telephone Encounter (Signed)
Pt aware of CY recommendations. Rx sent to preferred pharmacy. Pt aware & voiced understanding. Nothing further needed.

## 2016-08-19 ENCOUNTER — Telehealth: Payer: Self-pay | Admitting: Internal Medicine

## 2016-08-19 MED ORDER — IPRATROPIUM BROMIDE 0.02 % IN SOLN: 0.5000 mg | Freq: Three times a day (TID) | RESPIRATORY_TRACT | 6 refills | Status: AC | PRN

## 2016-08-19 NOTE — Telephone Encounter (Signed)
rx sent electronically with dx code  I called and spoke with the pharmacist and advised that this was done  She is getting it ready now, nothing further needed

## 2016-08-20 DIAGNOSIS — J441 Chronic obstructive pulmonary disease with (acute) exacerbation: Secondary | ICD-10-CM | POA: Diagnosis not present

## 2016-08-21 ENCOUNTER — Telehealth: Payer: Self-pay | Admitting: Internal Medicine

## 2016-08-21 ENCOUNTER — Other Ambulatory Visit: Payer: Self-pay | Admitting: Internal Medicine

## 2016-08-21 MED ORDER — FORMOTEROL FUMARATE 20 MCG/2ML IN NEBU: INHALATION_SOLUTION | RESPIRATORY_TRACT | 6 refills | Status: AC

## 2016-08-21 NOTE — Telephone Encounter (Signed)
Called the pharmacy and they did receive the perforomist.  Nothing further is  Needed.

## 2016-09-04 ENCOUNTER — Encounter: Payer: Self-pay | Admitting: Internal Medicine

## 2016-09-04 ENCOUNTER — Ambulatory Visit (INDEPENDENT_AMBULATORY_CARE_PROVIDER_SITE_OTHER): Payer: Medicare Other | Admitting: Internal Medicine

## 2016-09-04 DIAGNOSIS — J449 Chronic obstructive pulmonary disease, unspecified: Secondary | ICD-10-CM

## 2016-09-04 DIAGNOSIS — I5020 Unspecified systolic (congestive) heart failure: Secondary | ICD-10-CM | POA: Diagnosis not present

## 2016-09-04 MED ORDER — INDACATEROL-GLYCOPYRROLATE 27.5-15.6 MCG IN CAPS: 1.0000 | ORAL_CAPSULE | Freq: Two times a day (BID) | RESPIRATORY_TRACT | 0 refills | Status: DC

## 2016-09-04 MED ORDER — INDACATEROL-GLYCOPYRROLATE 27.5-15.6 MCG IN CAPS: 1.0000 | ORAL_CAPSULE | Freq: Two times a day (BID) | RESPIRATORY_TRACT | 12 refills | Status: DC

## 2016-09-04 MED ORDER — CEFDINIR 300 MG PO CAPS: 300.0000 mg | ORAL_CAPSULE | Freq: Two times a day (BID) | ORAL | 1 refills | Status: DC

## 2016-09-04 NOTE — Assessment & Plan Note (Signed)
The postnasal drainage consistent with a bilateral maxillary or pansinusitis Plan-Cefdinir antibiotic

## 2016-09-04 NOTE — Assessment & Plan Note (Signed)
She has not been using a maintenance controller-type inhaler. We discussed options. Plan-sample and prescription to Utibron inhaler,=.

## 2016-09-04 NOTE — Assessment & Plan Note (Signed)
Previous cardiomyopathy subsequently improved. Being followed by cardiology.

## 2016-09-04 NOTE — Progress Notes (Signed)
Patient ID: [name], female    DOB: [birth year], 82 y.o.   MRN: [id number]  HPI female former smoker with COPD/chronic obstructive asthma, markedly labile component with recurrent acute bronchitis, complicated by paroxysmal atrial fib, aortic stenosis, CHF, grade 2 diastolic dysfunction, RA, multiple sclerosis Cardiology note Dr Johnsie Cancel 07/19/13:  LVEF is estimated at 35-40%.  Echocardiogram 11/28/2015-EF 55-60% (much improved), grade 2 diastolic dysfunction, mild aortic stenosis  ------------------------------------------------------------------------------------------   04/24/2016-81 year old female former smoker with COPD/chronic obstructive asthma, markedly labile component with recurrent acute bronchitis, complicated by paroxysmal atrial fib, aortic stenosis, CHF, grade 2 diastolic dysfunction, RA, multiple sclerosis O2 2 L/Advanced-sleep and as needed Tapering prednisone after recent nonspecific exacerbation. She thinks may be working outdoors in the heat was enough to make her chest tight. No fever, purulent sputum or chest pain. Recent antibiotic for cellulitis on leg after fall. Planning carpal tunnel surgery with Dr. Apolonio Schneiders. Still pushes herself hard caring for her debilitated husband and 6 pets.  09/04/2016-82 year old female former smoker with COPD/chronic obstructive asthma, markedly labile component with recurrent acute bronchitis, complicated by paroxysmal atrial fib, aortic stenosis, CHF, grade 2 diastolic dysfunction, RA, multiple sclerosis O2 2 L/Advanced-sleep and as needed FOLLOWS FOR: DME:[facility] Pt continues to wear O2 QHS and during the day more than usual now. Pt states she continues to have SOB and wheezing.  (Patient seen in the office today and instructed on use of Utibron.  Patient expressed understanding and demonstrated technique. Blair Hailey) Shortness of breath varies with weather and stress related to caring for her husband. Over the last week or 2 she  has had head congestion with bothersome yellow postnasal drainage, no headache or fever.  Review of Systems-see HPI Constitutional:   No weight loss, night sweats, Fevers, chills, + fatigue, lassitude. HEENT:   No headaches,  Difficulty swallowing,  Tooth/dental problems,  Sore throat,                No-sneezing, itching, ear ache, +nasal congestion, +-post nasal drip,  CV:  No chest pain,  Orthopnea, PND, swelling in lower extremities, anasarca, dizziness, palpitations GI  No heartburn, indigestion, abdominal pain, nausea,   Resp:- + DOE, No excess mucus,  + Cough  sputum,  No coughing up of blood.         No- change in color of mucus.  +wheezing. Skin: no rash or lesions. GU: . MS:  + arthritis pains Psych:  No change in mood or affect. No depression or anxiety.  No memory loss.  Objective:   Physical Exam  General- Alert, Oriented, Affect-appropriate, Distress-  none; not in distress today. Talkative Skin- no rash Lymphadenopathy- none with special attention to cervical nodes Head- atraumatic            Eyes- Gross vision intact, PERRLA, conjunctivae clear secretions            Ears- Hearing, canals normal            Nose- no- sniffing, No-Septal dev, mucus, polyps, erosion, perforation             Throat- Mallampati II , mucosa clear , drainage+ Yellow postnasal drip, tonsils- atrophic,  Neck- flexible , trachea midline, no stridor , thyroid nl, carotid no bruit Chest - symmetrical excursion , unlabored           Heart/CV- Feels like RR , no murmur heard , no gallop  , no rub, nl s1 s2  JVD- -none, edema- none, stasis changes- none, varices- none           Lung-  +decreased breath sounds, Clear, wheeze-none, unlabored., cough- none , dullness-none, rub- none.            Chest wall-  Abd-  Br/ Gen/ Rectal- Not done, not indicated Extrem- cyanosis- none, clubbing, none, atrophy- none, strength- nl ,  +osteoarthritis changes in hands.  Neuro- grossly intact to  observation

## 2016-09-04 NOTE — Patient Instructions (Addendum)
Sample Utibron inhaler and script-   Inhale 1 dose daily   If it helps your breathing, then ok to fill the script  Continue other meds as before  Please call as needed

## 2016-09-11 DIAGNOSIS — I10 Essential (primary) hypertension: Secondary | ICD-10-CM | POA: Diagnosis not present

## 2016-09-11 DIAGNOSIS — J449 Chronic obstructive pulmonary disease, unspecified: Secondary | ICD-10-CM | POA: Diagnosis not present

## 2016-09-11 DIAGNOSIS — E782 Mixed hyperlipidemia: Secondary | ICD-10-CM | POA: Diagnosis not present

## 2016-09-11 DIAGNOSIS — F5101 Primary insomnia: Secondary | ICD-10-CM | POA: Diagnosis not present

## 2016-09-17 DIAGNOSIS — R600 Localized edema: Secondary | ICD-10-CM | POA: Diagnosis not present

## 2016-09-24 ENCOUNTER — Telehealth: Payer: Self-pay | Admitting: Internal Medicine

## 2016-09-24 MED ORDER — UMECLIDINIUM-VILANTEROL 62.5-25 MCG/INH IN AEPB
1.0000 | INHALATION_SPRAY | Freq: Every day | RESPIRATORY_TRACT | 11 refills | Status: DC
Start: 2016-09-24 — End: 2016-12-05

## 2016-09-24 NOTE — Telephone Encounter (Signed)
Spoke with pt, aware of recs.  Med list updated, anoro sent to preferred pharmacy.  Nothing further needed.

## 2016-09-24 NOTE — Telephone Encounter (Signed)
Spoke with pt, states that utibron inhaler is not available at her pharmacy- per pt her pharmacist states this is not available on the market currently.  I advised pt that this medication is in fact on the market and that she may be able to pick this up at a different pharmacy.  Pt disagreed and stated that she needed an alternative inhaler that she could get at her own pharmacy.    CY please advise on alternative inhaler to utibron.  Thanks!

## 2016-09-24 NOTE — Telephone Encounter (Signed)
DC Utibron from her med list  Offer Anoro    Inhale 1 puff, once daily   # 1, refill x 12

## 2016-11-05 ENCOUNTER — Other Ambulatory Visit: Payer: Self-pay | Admitting: Nurse Practitioner

## 2016-11-11 DIAGNOSIS — S51809A Unspecified open wound of unspecified forearm, initial encounter: Secondary | ICD-10-CM | POA: Diagnosis not present

## 2016-11-11 DIAGNOSIS — S41109A Unspecified open wound of unspecified upper arm, initial encounter: Secondary | ICD-10-CM | POA: Diagnosis not present

## 2016-11-18 DIAGNOSIS — S41109A Unspecified open wound of unspecified upper arm, initial encounter: Secondary | ICD-10-CM | POA: Diagnosis not present

## 2016-11-18 DIAGNOSIS — S51809A Unspecified open wound of unspecified forearm, initial encounter: Secondary | ICD-10-CM | POA: Diagnosis not present

## 2016-11-19 ENCOUNTER — Telehealth: Payer: Self-pay | Admitting: Internal Medicine

## 2016-11-19 MED ORDER — PREDNISONE 10 MG PO TABS: ORAL_TABLET | ORAL | 0 refills | Status: DC

## 2016-11-19 NOTE — Telephone Encounter (Signed)
Spoke with pt. States that she is having a COPD flare. Reports SOB and chest tightness. Denies wheezing, coughing or fever. Symptoms started a few days ago. Has been still taking prescribed pulmonary medications. Would like CY's recommendations.  CY - please advise. Thanks.  Allergies  Allergen Reactions  . Amoxicillin-Pot Clavulanate     GI upset  . Daliresp [Roflumilast] Other (See Comments)    unknown  . Diltiazem Nausea Only  . Montelukast Sodium Other (See Comments)     flu-like symptoms  . Zafirlukast Other (See Comments)    unknown  . Adhesive [Tape] Rash    Please use paper tape   Current Outpatient Prescriptions on File Prior to Visit  Medication Sig Dispense Refill  . ALPRAZolam (XANAX) 0.25 MG tablet Take 0.25 mg by mouth daily.    Marland Kitchen atenolol (TENORMIN) 25 MG tablet TAKE 1 TABLET (25 MG TOTAL) BY MOUTH DAILY. 30 tablet 3  . azelastine (ASTELIN) 0.1 % nasal spray 1-2 puffs each nostril once or twice daily, 30 mL prn  . budesonide (PULMICORT) 0.25 MG/2ML nebulizer solution USE 1 VIAL IN NEBULIZER TWICE A DAY 120 mL 6  . Calcium Carb-Cholecalciferol (CALCIUM 600/VITAMIN D3) 600-800 MG-UNIT TABS Take 1 tablet by mouth daily.    . Calcium Carbonate (CALCIUM 600 PO) Take 1 tablet by mouth daily.    . cefdinir (OMNICEF) 300 MG capsule Take 1 capsule (300 mg total) by mouth 2 (two) times daily. 14 capsule 1  . formoterol (PERFOROMIST) 20 MCG/2ML nebulizer solution USE 1 VIAL IN NEBULIZER TWICE A DAY 120 mL 6  . gabapentin (NEURONTIN) 300 MG capsule Take 300 mg by mouth at bedtime.  3  . guaiFENesin (MUCINEX) 600 MG 12 hr tablet Take 600 mg by mouth daily.    . hydrochlorothiazide 25 MG tablet Take 25 mg by mouth daily.     Marland Kitchen HYDROcodone-acetaminophen (NORCO) 10-325 MG tablet Take 1 tablet by mouth 4 (four) times daily as needed.  0  . ipratropium (ATROVENT) 0.02 % nebulizer solution Take 2.5 mLs (0.5 mg total) by nebulization 3 (three) times daily as needed for wheezing or  shortness of breath. DX code J44.9. 187.5 mL 6  . levalbuterol (XOPENEX) 0.63 MG/3ML nebulizer solution Take 3 mLs (0.63 mg total) by nebulization every 6 (six) hours as needed for wheezing or shortness of breath. 360 mL 5  . omeprazole (PRILOSEC) 20 MG capsule Take 20 mg by mouth 2 (two) times daily before a meal.     . predniSONE (DELTASONE) 10 MG tablet One daily for increase for flare 20 tablet 0  . predniSONE (DELTASONE) 10 MG tablet Take 1 tablet (10 mg total) by mouth daily. May take up to 2 tablets if needed 50 tablet 5  . PROAIR HFA 108 (90 BASE) MCG/ACT inhaler INHALE 2 PUFFS BY MOUTH EVERY 6 HOURS AS NEEDED FOR WHEEZING OR SHORTNESS OF BREATH 8.5 Inhaler 2  . simvastatin (ZOCOR) 20 MG tablet Take 20 mg by mouth daily.     Marland Kitchen umeclidinium-vilanterol (ANORO ELLIPTA) 62.5-25 MCG/INH AEPB Inhale 1 puff into the lungs daily. 1 each 11  . [DISCONTINUED] metoprolol tartrate (LOPRESSOR) 25 MG tablet Take 1 tablet (25 mg total) by mouth 2 (two) times daily. 30 tablet 0  . [DISCONTINUED] pantoprazole (PROTONIX) 40 MG tablet Take 1 tablet (40 mg total) by mouth daily at 12 noon. 10 tablet 0   No current facility-administered medications on file prior to visit.

## 2016-11-19 NOTE — Telephone Encounter (Signed)
Spoke with pt. She is aware of CY's recommendations. Pt denied any coughing (productive and non productive), fever or body aches. The only symptoms that she is having is SOB and chest tightness as previously stated in my documentation. Rx for prednisone has been sent in. Nothing further was needed.

## 2016-11-19 NOTE — Telephone Encounter (Signed)
Prednisone 10 mg, # 42   6 x 2 days, 5 x 2 days, 4 x 2 days, 3 x 2 days, 2 x 2 days, 1 x 2 days  Does she have fever or purulent mucus that might need an antibiotic?  Flu symptoms- fever, dry cough, body aches ?

## 2016-11-25 ENCOUNTER — Telehealth: Payer: Self-pay | Admitting: Internal Medicine

## 2016-11-25 NOTE — Telephone Encounter (Signed)
Spoke with pt. She is aware of CY's response. States that she does not know of any other inhalers that worked for her in the past. Nothing further was needed.

## 2016-11-25 NOTE — Telephone Encounter (Signed)
Spoke with pt. States that she is not feeling any better from last week. Still reports SOB and chest tightness. Denies cough, wheezing, fever, body aches or chills. When she called in last week, CY prescribed a prednisone taper. She has a few days left of this but she doesn't feel like it's helping as much as it should. After further talking with the pt, I found out that she is not on her maintenance inhaler, Anoro. She states that, "I would almost choke to death when I would take it."  CY - please advise. Thanks.

## 2016-11-25 NOTE — Telephone Encounter (Signed)
Does she remember a different maintenance inhaler that worked better for her?  She can stay off Anoro and huse her nebulizer machine 2-4 times daily as needed. See if that helps more.

## 2016-12-05 ENCOUNTER — Other Ambulatory Visit: Payer: Self-pay | Admitting: Internal Medicine

## 2016-12-15 ENCOUNTER — Emergency Department (HOSPITAL_COMMUNITY)
Admission: EM | Admit: 2016-12-15 | Discharge: 2016-12-15 | Disposition: A | Discharge status: Home / Self Care | Payer: Medicare Other | Attending: Emergency Medicine | Admitting: Emergency Medicine

## 2016-12-15 ENCOUNTER — Emergency Department (HOSPITAL_COMMUNITY): Payer: Medicare Other

## 2016-12-15 ENCOUNTER — Encounter (HOSPITAL_COMMUNITY): Payer: Self-pay | Admitting: Emergency Medicine

## 2016-12-15 DIAGNOSIS — I110 Hypertensive heart disease with heart failure: Secondary | ICD-10-CM | POA: Diagnosis not present

## 2016-12-15 DIAGNOSIS — I4891 Unspecified atrial fibrillation: Secondary | ICD-10-CM | POA: Diagnosis not present

## 2016-12-15 DIAGNOSIS — R55 Syncope and collapse: Secondary | ICD-10-CM | POA: Diagnosis not present

## 2016-12-15 DIAGNOSIS — J449 Chronic obstructive pulmonary disease, unspecified: Secondary | ICD-10-CM | POA: Diagnosis not present

## 2016-12-15 DIAGNOSIS — Z79899 Other long term (current) drug therapy: Secondary | ICD-10-CM | POA: Insufficient documentation

## 2016-12-15 DIAGNOSIS — R000 Tachycardia, unspecified: Secondary | ICD-10-CM | POA: Diagnosis not present

## 2016-12-15 DIAGNOSIS — Z87891 Personal history of nicotine dependence: Secondary | ICD-10-CM | POA: Diagnosis not present

## 2016-12-15 DIAGNOSIS — R42 Dizziness and giddiness: Secondary | ICD-10-CM | POA: Diagnosis not present

## 2016-12-15 DIAGNOSIS — R079 Chest pain, unspecified: Secondary | ICD-10-CM | POA: Diagnosis present

## 2016-12-15 DIAGNOSIS — I509 Heart failure, unspecified: Secondary | ICD-10-CM | POA: Diagnosis not present

## 2016-12-15 DIAGNOSIS — R0602 Shortness of breath: Secondary | ICD-10-CM | POA: Diagnosis not present

## 2016-12-15 DIAGNOSIS — R404 Transient alteration of awareness: Secondary | ICD-10-CM | POA: Diagnosis not present

## 2016-12-15 LAB — CBC WITH DIFFERENTIAL/PLATELET
Basophils Absolute: 0 10*3/uL (ref 0.0–0.1)
Basophils Relative: 0 %
Eosinophils Absolute: 0 10*3/uL (ref 0.0–0.7)
Eosinophils Relative: 0 %
HCT: 41.3 % (ref 36.0–46.0)
Hemoglobin: 13.5 g/dL (ref 12.0–15.0)
Lymphocytes Relative: 6 %
Lymphs Abs: 0.9 10*3/uL (ref 0.7–4.0)
MCH: 29.7 pg (ref 26.0–34.0)
MCHC: 32.7 g/dL (ref 30.0–36.0)
MCV: 90.8 fL (ref 78.0–100.0)
Monocytes Absolute: 1.1 10*3/uL — ABNORMAL HIGH (ref 0.1–1.0)
Monocytes Relative: 8 %
Neutro Abs: 12.5 10*3/uL — ABNORMAL HIGH (ref 1.7–7.7)
Neutrophils Relative %: 86 %
Platelets: 191 10*3/uL (ref 150–400)
RBC: 4.55 MIL/uL (ref 3.87–5.11)
RDW: 15.2 % (ref 11.5–15.5)
WBC: 14.5 10*3/uL — ABNORMAL HIGH (ref 4.0–10.5)

## 2016-12-15 LAB — BASIC METABOLIC PANEL
Anion gap: 8 (ref 5–15)
BUN: 17 mg/dL (ref 6–20)
CO2: 32 mmol/L (ref 22–32)
Calcium: 8.9 mg/dL (ref 8.9–10.3)
Chloride: 100 mmol/L — ABNORMAL LOW (ref 101–111)
Creatinine, Ser: 0.67 mg/dL (ref 0.44–1.00)
GFR calc Af Amer: >60 mL/min (ref >60)
GFR calc non Af Amer: >60 mL/min (ref >60)
Glucose, Bld: 124 mg/dL — ABNORMAL HIGH (ref 65–99)
Potassium: 4 mmol/L (ref 3.5–5.1)
Sodium: 140 mmol/L (ref 135–145)

## 2016-12-15 LAB — MAGNESIUM: Magnesium: 2 mg/dL (ref 1.7–2.4)

## 2016-12-15 LAB — BRAIN NATRIURETIC PEPTIDE: B Natriuretic Peptide: 291 pg/mL — ABNORMAL HIGH (ref 0.0–100.0)

## 2016-12-15 LAB — TSH: TSH: 0.679 u[IU]/mL (ref 0.350–4.500)

## 2016-12-15 LAB — TROPONIN I: Troponin I: <0.03 ng/mL (ref <0.03)

## 2016-12-15 MED ORDER — ATENOLOL 50 MG PO TABS: 50.0000 mg | ORAL_TABLET | Freq: Every day | ORAL | 0 refills | Status: AC

## 2016-12-15 MED ORDER — METOPROLOL TARTRATE 5 MG/5ML IV SOLN
5.0000 mg | Freq: Once | INTRAVENOUS | Status: AC
  Administered 2016-12-15: 2.5 mg via INTRAVENOUS
  Filled 2016-12-15: qty 5

## 2016-12-15 MED ORDER — SODIUM CHLORIDE 0.9 % IV BOLUS (SEPSIS)
500.0000 mL | Freq: Once | INTRAVENOUS | Status: AC
  Administered 2016-12-15: 500 mL via INTRAVENOUS

## 2016-12-15 MED ORDER — APIXABAN 5 MG PO TABS: 5.0000 mg | ORAL_TABLET | Freq: Two times a day (BID) | ORAL | 0 refills | Status: AC

## 2016-12-15 NOTE — ED Provider Notes (Signed)
[facility] DEPT Provider Note   CSN: [id number] Arrival date & time: 12/15/16  0944  By signing my name below, I, [name], attest that this documentation has been prepared under the direction and in the presence of Virgel Manifold, MD . Electronically Signed: [name], Scribe. 12/15/2016. 10:21 AM.   History   Chief Complaint Chief Complaint  Patient presents with  . Chest Pain  . Shortness of Breath  . Near Syncope     The history is provided by the patient. No language interpreter was used.     HPI Comments:  LITZY Armstrong is a 83 y.o. female with a history of CHF,  PAF,  and COPD, who presents to the Emergency Department via EMS complaining of chest heaviness x a few days. She reports associated SOB, palpitations, and intermittent dizziness; states positional changes caused her to feel near syncopal. Pt also notes wheezing.  Pt was given 324 mg ASA by EMS PTA.  She denies LE swelling. Pt states she has been compliant with her daily meds.   Clinton Young- Pulmonologist  Past Medical History:  Diagnosis Date  . Allergic rhinitis    takes Mucinex daily  . Anxiety    takes Alprazolam daily  . Aortic stenosis    mild by echo (2008)  . Asthma    uses Albuterol daily as needed. Pulmicort and Perforomist neb daily  . Bronchitis, chronic obstructive, with exacerbation ([place])   . Bruises easily   . Cataract    right  . Chronic back pain    HNP  . COPD (chronic obstructive pulmonary disease) (HCC)    Xopenex and Atrobent neb daily   . GERD (gastroesophageal reflux disease)    takes Omeprazole daily  . Hemorrhoid   . History of bronchitis    many yrs ago  . History of colon polyps   . History of gout    no meds required  . History of staph infection   . Hyperlipidemia    takes Simvastatin daily  . Hypertension    takes Losartan daily  . Joint pain   . Joint swelling   . Multiple sclerosis ([place])   . Paroxysmal atrial fibrillation ([place]) 11/08  .  Peripheral edema   . Pneumonia    hx of-as a baby  . PONV (postoperative nausea and vomiting)   . Rheumatoid arthritis(714.0)   . Shortness of breath    with exertion  . Stasis dermatitis     Patient Active Problem List   Diagnosis Date Noted  . Thrush 08/18/2014  . HNP (herniated nucleus pulposus), lumbar 12/15/2013  . Sinusitis, acute maxillary 10/21/2013  . CHF (congestive heart failure) ([place]) 06/16/2013  . Acute exacerbation of chronic obstructive pulmonary disease (COPD) ([place]) 12/08/2011  . Dyslipidemia 09/08/2011  . Anxiety 09/08/2011  . PAF (paroxysmal atrial fibrillation) ([place]) 01/06/2011  . Other specified cardiac dysrhythmias(427.89) 06/27/2010  . COLD SORE 10/22/2009  . DYSPNEA 06/05/2009  . Situational depression 06/04/2009  . Aortic stenosis 06/04/2009  . GERD 06/04/2009  . DERMATITIS 06/04/2009  . Seasonal and perennial allergic rhinitis 12/09/2007  . Essential hypertension 11/10/2007  . Rheumatoid arthritis(714.0) 11/10/2007  . Multiple sclerosis ([place]) 07/01/2007  . STASIS DERMATITIS 07/01/2007  . COPD mixed type ([place]) 07/01/2007    Past Surgical History:  Procedure Laterality Date  . ABDOMINAL HYSTERECTOMY    . BREAST CYST EXCISION    . cataract surgery Left   . COLONOSCOPY    . LEFT AND RIGHT HEART CATHETERIZATION WITH  CORONARY ANGIOGRAM N/A 07/13/2013   Procedure: LEFT AND RIGHT HEART CATHETERIZATION WITH CORONARY ANGIOGRAM;  Surgeon: Blane Ohara, MD;  Location: [facility] CATH LAB;  Service: Cardiovascular;  Laterality: N/A;  . LUMBAR DISC SURGERY  2003   L5  . LUMBAR LAMINECTOMY/DECOMPRESSION MICRODISCECTOMY Right 12/15/2013   Procedure: LUMBAR LAMINECTOMY/DECOMPRESSION MICRODISCECTOMY 1 LEVEL two/three;  Surgeon: Winfield Cunas, MD;  Location: [facility];  Service: Neurosurgery;  Laterality: Right;  Right L23 microdiskectomy    OB History    No data available       Home Medications    Prior to Admission medications   Medication Sig Start  Date End Date Taking? Authorizing Provider  ALPRAZolam (XANAX) 0.25 MG tablet Take 0.25 mg by mouth daily.   Yes Historical Provider, MD  ANORO ELLIPTA 62.5-25 MCG/INH AEPB INHALE 1 PUFF INTO THE LUNGS DAILY. 12/07/16  Yes Deneise Lever, MD  atenolol (TENORMIN) 25 MG tablet TAKE 1 TABLET (25 MG TOTAL) BY MOUTH DAILY. 11/05/16  Yes Burtis Junes, NP  budesonide (PULMICORT) 0.25 MG/2ML nebulizer solution USE 1 VIAL IN NEBULIZER TWICE A DAY 12/31/15  Yes Deneise Lever, MD  Calcium Carb-Cholecalciferol (CALCIUM 600/VITAMIN D3) 600-800 MG-UNIT TABS Take 1 tablet by mouth daily.   Yes Historical Provider, MD  Calcium Carbonate (CALCIUM 600 PO) Take 1 tablet by mouth daily.   Yes Historical Provider, MD  formoterol (PERFOROMIST) 20 MCG/2ML nebulizer solution USE 1 VIAL IN NEBULIZER TWICE A DAY 08/21/16  Yes Deneise Lever, MD  gabapentin (NEURONTIN) 300 MG capsule Take 300 mg by mouth 3 (three) times daily.  01/19/16  Yes Historical Provider, MD  guaiFENesin (MUCINEX) 600 MG 12 hr tablet Take 600 mg by mouth 2 (two) times daily as needed for cough or to loosen phlegm.    Yes Historical Provider, MD  hydrochlorothiazide 25 MG tablet Take 25 mg by mouth daily.    Yes Historical Provider, MD  ipratropium (ATROVENT) 0.02 % nebulizer solution Take 2.5 mLs (0.5 mg total) by nebulization 3 (three) times daily as needed for wheezing or shortness of breath. DX code J44.9. 08/19/16  Yes Deneise Lever, MD  levalbuterol (XOPENEX) 0.63 MG/3ML nebulizer solution Take 3 mLs (0.63 mg total) by nebulization every 6 (six) hours as needed for wheezing or shortness of breath. 06/30/16  Yes Deneise Lever, MD  PROAIR HFA 108 (90 BASE) MCG/ACT inhaler INHALE 2 PUFFS BY MOUTH EVERY 6 HOURS AS NEEDED FOR WHEEZING OR SHORTNESS OF BREATH 11/15/14  Yes Deneise Lever, MD  simvastatin (ZOCOR) 20 MG tablet Take 20 mg by mouth daily.    Yes Historical Provider, MD  azelastine (ASTELIN) 0.1 % nasal spray 1-2 puffs each nostril once  or twice daily, Patient not taking: Reported on 12/15/2016 07/29/15   Deneise Lever, MD  cefdinir (OMNICEF) 300 MG capsule Take 1 capsule (300 mg total) by mouth 2 (two) times daily. Patient not taking: Reported on 12/15/2016 09/04/16   Deneise Lever, MD  predniSONE (DELTASONE) 10 MG tablet Take 6 x 2 days, 5 x 2 days, 4 x 2 days, 3 x 2 days, 2 x 2 days, 1 x 2 days Patient not taking: Reported on 12/15/2016 11/19/16   Deneise Lever, MD    Family History Family History  Problem Relation Age of Onset  . Asthma Mother   . Emphysema Father   . Alzheimer's disease Brother   . Colon cancer Other     sibling  . Heart disease  Other     sibling    Social History Social History  Substance Use Topics  . Smoking status: Former Smoker    Packs/day: 1.00    Years: 25.00    Types: Cigarettes    Quit date: 01/12/1989  . Smokeless tobacco: Never Used     Comment: quit 19yrs ago  . Alcohol use No     Allergies   Amoxicillin-pot clavulanate; Daliresp [roflumilast]; Diltiazem; Montelukast sodium; Zafirlukast; and Adhesive [tape]   Review of Systems Review of Systems  Respiratory: Positive for shortness of breath and wheezing.   Cardiovascular: Positive for chest pain and palpitations. Negative for leg swelling.  Neurological: Positive for dizziness. Negative for syncope.  All other systems reviewed and are negative.  Physical Exam Updated Vital Signs BP 95/63   Pulse 93   Temp 98.7 F (37.1 C) (Oral)   Resp 20   Ht 5\' 1"  (1.549 m)   Wt 120 lb (54.4 kg)   SpO2 94%   BMI 22.67 kg/m   Physical Exam  Constitutional: She is oriented to person, place, and time. She appears well-developed and well-nourished. No distress.  HENT:  Head: Normocephalic and atraumatic.  Eyes: Conjunctivae are normal.  Cardiovascular: Normal heart sounds.  An irregularly irregular rhythm present. Tachycardia present.   Pulmonary/Chest: Effort normal. No respiratory distress. She has wheezes.    Abdominal: She exhibits no distension.  Neurological: She is alert and oriented to person, place, and time.  Skin: Skin is warm and dry.  Psychiatric: She has a normal mood and affect.  Nursing note and vitals reviewed.  ED Treatments / Results  DIAGNOSTIC STUDIES:  Oxygen Saturation is 95% on RA, adequate  by my interpretation.    COORDINATION OF CARE:  10:18 AM Discussed treatment plan with pt at bedside and pt agreed to plan.  Labs (all labs ordered are listed, but only abnormal results are displayed) Labs Reviewed  CBC WITH DIFFERENTIAL/PLATELET - Abnormal; Notable for the following:       Result Value   WBC 14.5 (*)    Neutro Abs 12.5 (*)    Monocytes Absolute 1.1 (*)    All other components within normal limits  BASIC METABOLIC PANEL - Abnormal; Notable for the following:    Chloride 100 (*)    Glucose, Bld 124 (*)    All other components within normal limits  BRAIN NATRIURETIC PEPTIDE - Abnormal; Notable for the following:    B Natriuretic Peptide 291.0 (*)    All other components within normal limits  TROPONIN I  MAGNESIUM  TSH    EKG  EKG Interpretation  Date/Time:  Tuesday December 15 2016 09:48:51 EDT Ventricular Rate:  110 PR Interval:    QRS Duration: 84 QT Interval:  344 QTC Calculation: 451 R Axis:   128 Text Interpretation:  Atrial fibrillation Right axis deviation Low voltage, extremity leads Probable anteroseptal infarct, old Confirmed by Wilson Singer  MD, Bralyn Espino [phone]) on 12/15/2016 10:24:17 AM       Radiology Dg Chest 2 View  Result Date: 12/15/2016 CLINICAL DATA:  Chest heaviness for a few days.  Shortness of breath EXAM: CHEST  2 VIEW COMPARISON:  12/12/2014 FINDINGS: There is hyperinflation of the lungs compatible with COPD. Heart and mediastinal contours are within normal limits. No focal opacities or effusions. No acute bony abnormality. IMPRESSION: COPD.  No active disease. Electronically Signed   By: Rolm Armstrong M.D.   On: 12/15/2016 11:08     Procedures Procedures (including critical care  time)  Medications Ordered in ED Medications - No data to display   Initial Impression / Assessment and Plan / ED Course  I have reviewed the triage vital signs and the nursing notes.  Pertinent labs & imaging results that were available during my care of the patient were reviewed by me and considered in my medical decision making (see chart for details).     Final Clinical Impressions(s) / ED Diagnoses   Final diagnoses:  Atrial fibrillation with RVR ([place])   83yF with what sounds like near syncope. Possibly orthostatic, but I question how much of her symptoms related to afib. In the ED, rate 100-130 at rest. When asked she does endorsefeeling of her hear racing at periodically, but unable to give an exact timeline.   Her medications need to be reconciled. Last cardiology note by Dr Johnsie Cancel 02/2016 has atenolol listed. She doesn't appear to be on any rate control currently or anticoagulated. Hx of PAF but documented sinus rhythm on more recent cardiology evaluations.   Hx of COPD and some mild wheezing on exam but says her respiratory status doesn't feel that much different than her baseline.    New Prescriptions Discharge Medication List as of 12/15/2016  3:19 PM    START taking these medications   Details  apixaban (ELIQUIS) 5 MG TABS tablet Take 1 tablet (5 mg total) by mouth 2 (two) times daily., Starting Tue 12/15/2016, Print       I personally preformed the services scribed in my presence. The recorded information has been reviewed is accurate. Virgel Manifold, MD.     Virgel Manifold, MD 12/19/16 [phone]

## 2016-12-15 NOTE — ED Triage Notes (Signed)
Pt in from home via [facility] EMS with c/o chest pressure x 2 days, sob and near syncope. Pt has expiratory wheezing, hx of COPD. Pt had Albuterol neb at home this am, is in afib (has hx) 110-130. Pt wears 2L [place] PRN at home, sats currently 96% on RA. EMS gave 324 mg ASA. Pt denies syncope, endorses dizziness x 2 days. BP 99/66 for EMS, CBG 252. Pt a&ox4, able to speak in full sentences

## 2016-12-15 NOTE — ED Notes (Signed)
Patient transported to X-ray 

## 2016-12-15 NOTE — ED Notes (Signed)
Patient Alert and oriented X4. Stable and ambulatory. Patient verbalized understanding of the discharge instructions.  Patient belongings were taken by the patient.  

## 2016-12-22 NOTE — Progress Notes (Signed)
Patient ID: [name], female   DOB: 02/04/1933, 83 y.o.   MRN: [id number]     CARDIOLOGY OFFICE NOTE  Date:  12/23/2016    [name] Date of Birth: 1933-03-04 Medical Record #[id number]  PCP:  Lillard Anes, MD  Cardiologist:  Johnsie Cancel    No chief complaint on file.   History of Present Illness: [name] is a 83 y.o. female who presents today for a one year check.    She has a history of mild AS, PAF, chronic dyspnea/COPD and chronic back issues. She is oxygen dependent.     Last seen by PA 10/2015   Husband had a stroke. She is now the total caregiver. Kids not really helping but she has a grand daughter that seems to be helping out. Little chest discomfort - she attributes this to her lungs. She is chronically short of breath and on oxygen. Thinks her breathing may be worse. Some dizziness. No syncope. She is fatigued but sounds like she has a lot on her plate at home. She does not check her BP at home. Labs are checked by PCP.   Updated echo 11/2015 sowed mild AS and improved EF with a mean gradient only 18 mmHg  Study Conclusions  - Left ventricle: The cavity size was normal. Wall thickness was  normal. Systolic function was normal. The estimated ejection  fraction was in the range of 55% to 60%. Wall motion was normal;  there were no regional wall motion abnormalities. Features are  consistent with a pseudonormal left ventricular filling pattern,  with concomitant abnormal relaxation and increased filling  pressure (grade 2 diastolic dysfunction). - Aortic valve: There was mild stenosis.  Impressions:  - Compared to the report from 2014, there is marked improvement in  LV function.  Still stressed with caring for 6 animals her grand daughter and husband.  Seen at [facility] 12/15/16 for afib and presyncope Started on Eliquis and atenolol resumed At higher dose 50 mg     Past Medical History:  Diagnosis Date  . Allergic rhinitis    takes  Mucinex daily  . Anxiety    takes Alprazolam daily  . Aortic stenosis    mild by echo (2008)  . Asthma    uses Albuterol daily as needed. Pulmicort and Perforomist neb daily  . Bronchitis, chronic obstructive, with exacerbation ([place])   . Bruises easily   . Cataract    right  . Chronic back pain    HNP  . COPD (chronic obstructive pulmonary disease) (HCC)    Xopenex and Atrobent neb daily   . GERD (gastroesophageal reflux disease)    takes Omeprazole daily  . Hemorrhoid   . History of bronchitis    many yrs ago  . History of colon polyps   . History of gout    no meds required  . History of staph infection   . Hyperlipidemia    takes Simvastatin daily  . Hypertension    takes Losartan daily  . Joint pain   . Joint swelling   . Multiple sclerosis ([place])   . Paroxysmal atrial fibrillation ([place]) 11/08  . Peripheral edema   . Pneumonia    hx of-as a baby  . PONV (postoperative nausea and vomiting)   . Rheumatoid arthritis(714.0)   . Shortness of breath    with exertion  . Stasis dermatitis     Past Surgical History:  Procedure Laterality Date  . ABDOMINAL HYSTERECTOMY    .  BREAST CYST EXCISION    . cataract surgery Left   . COLONOSCOPY    . LEFT AND RIGHT HEART CATHETERIZATION WITH CORONARY ANGIOGRAM N/A 07/13/2013   Procedure: LEFT AND RIGHT HEART CATHETERIZATION WITH CORONARY ANGIOGRAM;  Surgeon: Blane Ohara, MD;  Location: [facility] CATH LAB;  Service: Cardiovascular;  Laterality: N/A;  . LUMBAR DISC SURGERY  2003   L5  . LUMBAR LAMINECTOMY/DECOMPRESSION MICRODISCECTOMY Right 12/15/2013   Procedure: LUMBAR LAMINECTOMY/DECOMPRESSION MICRODISCECTOMY 1 LEVEL two/three;  Surgeon: Winfield Cunas, MD;  Location: [facility] NEURO ORS;  Service: Neurosurgery;  Laterality: Right;  Right L23 microdiskectomy     Medications: Current Outpatient Prescriptions  Medication Sig Dispense Refill  . ALPRAZolam (XANAX) 0.25 MG tablet Take 0.25 mg by mouth daily.    Jearl Klinefelter ELLIPTA 62.5-25  MCG/INH AEPB INHALE 1 PUFF INTO THE LUNGS DAILY. 60 each 0  . apixaban (ELIQUIS) 5 MG TABS tablet Take 1 tablet (5 mg total) by mouth 2 (two) times daily. 60 tablet 0  . atenolol (TENORMIN) 50 MG tablet Take 1 tablet (50 mg total) by mouth daily. 30 tablet 0  . azelastine (ASTELIN) 0.1 % nasal spray 1-2 puffs each nostril once or twice daily, 30 mL prn  . budesonide (PULMICORT) 0.25 MG/2ML nebulizer solution USE 1 VIAL IN NEBULIZER TWICE A DAY 120 mL 6  . formoterol (PERFOROMIST) 20 MCG/2ML nebulizer solution USE 1 VIAL IN NEBULIZER TWICE A DAY 120 mL 6  . gabapentin (NEURONTIN) 300 MG capsule Take 300 mg by mouth 4 (four) times daily.   3  . guaiFENesin (MUCINEX) 600 MG 12 hr tablet Take 600 mg by mouth 2 (two) times daily as needed for cough or to loosen phlegm.     . hydrochlorothiazide 25 MG tablet Take 25 mg by mouth daily.     Marland Kitchen ipratropium (ATROVENT) 0.02 % nebulizer solution Take 2.5 mLs (0.5 mg total) by nebulization 3 (three) times daily as needed for wheezing or shortness of breath. DX code J44.9. 187.5 mL 6  . levalbuterol (XOPENEX) 0.63 MG/3ML nebulizer solution Take 3 mLs (0.63 mg total) by nebulization every 6 (six) hours as needed for wheezing or shortness of breath. 360 mL 5  . predniSONE (DELTASONE) 10 MG tablet Take 10 mg by mouth 2 (two) times daily.    Marland Kitchen PROAIR HFA 108 (90 BASE) MCG/ACT inhaler INHALE 2 PUFFS BY MOUTH EVERY 6 HOURS AS NEEDED FOR WHEEZING OR SHORTNESS OF BREATH 8.5 Inhaler 2  . simvastatin (ZOCOR) 20 MG tablet Take 20 mg by mouth daily.      No current facility-administered medications for this visit.     Allergies: Allergies  Allergen Reactions  . Amoxicillin-Pot Clavulanate Other (See Comments)    GI upset  . Daliresp [Roflumilast] Other (See Comments)    unknown  . Diltiazem Nausea Only  . Montelukast Sodium Other (See Comments)     flu-like symptoms  . Zafirlukast Other (See Comments)    unknown  . Adhesive [Tape] Rash    Please use paper tape      Social History: The patient  reports that she quit smoking about 27 years ago. Her smoking use included Cigarettes. She has a 25.00 pack-year smoking history. She has never used smokeless tobacco. She reports that she does not drink alcohol or use drugs.   Family History: The patient's family history includes Alzheimer's disease in her brother; Asthma in her mother; Colon cancer in her other; Emphysema in her father; Heart disease in her other.  Review of Systems: Please see the history of present illness.   Otherwise, the review of systems is positive for none.   All other systems are reviewed and negative.   Physical Exam: VS:  BP 140/86   Pulse 86   Ht 5\' 2"  (1.575 m)   Wt 121 lb 6.4 oz (55.1 kg)   SpO2 94%   BMI 22.20 kg/m  .  BMI Body mass index is 22.2 kg/m.  Wt Readings from Last 3 Encounters:  12/23/16 121 lb 6.4 oz (55.1 kg)  12/15/16 120 lb (54.4 kg)  09/04/16 122 lb 3.2 oz (55.4 kg)   BP by me is 140/90.   General: Pleasant. Elderly female who looks chronically ill but in no acute distress. She was 146 pounds one year ago - now down to 132.  HEENT: Normal.  Neck: Supple, no JVD,  Right carotid bruits, or masses noted.  Cardiac: Regular rate and rhythm. Harsh murmur of AS noted.  No edema.  Respiratory:  Lungs are fairly clear to auscultation bilaterally with normal work of breathing.  GI: Soft and nontender.  MS: No deformity or atrophy. Gait and ROM intact.  Skin: Warm and dry. Color is normal.  Neuro:  Strength and sensation are intact and no gross focal deficits noted.  Psych: Alert, appropriate and with normal affect. Arthritic changes in hands   LABORATORY DATA:  EKG:  10/2015 This demonstrates NSR with anterior infarct, poor R wave progression. 02/21/16 SR frequent PAC;s low voltage   Lab Results  Component Value Date   WBC 14.5 (H) 12/15/2016   HGB 13.5 12/15/2016   HCT 41.3 12/15/2016   PLT 191 12/15/2016   GLUCOSE 124 (H) 12/15/2016   CHOL   07/30/2007    195        ATP III CLASSIFICATION:  <200     mg/dL   Desirable  200-239  mg/dL   Borderline High  >=240    mg/dL   High   TRIG 143 07/30/2007   HDL 39 (L) 07/30/2007   LDLCALC (H) 07/30/2007    127        Total Cholesterol/HDL:CHD Risk Coronary Heart Disease Risk Table                     Men   Women  1/2 Average Risk   3.4   3.3   ALT 23 12/07/2011   AST 18 12/07/2011   NA 140 12/15/2016   K 4.0 12/15/2016   CL 100 (L) 12/15/2016   CREATININE 0.67 12/15/2016   BUN 17 12/15/2016   CO2 32 12/15/2016   TSH 0.679 12/15/2016   INR 1.0 07/10/2013    BNP (last 3 results)  Recent Labs  12/15/16 1037  BNP 291.0*    ProBNP (last 3 results) No results for input(s): PROBNP in the last 8760 hours.   Other Studies Reviewed Today:   Echo 8/14  Study Conclusions  - Left ventricle: The cavity size was moderately dilated. Wall thickness was normal. Systolic function was severely reduced. The estimated ejection fraction was in the range of 25% to 30%. Diffuse hypokinesis. - Aortic valve: Moderately calcified with likely moderate AS given degree of LV dysfunction - Mitral valve: Mild regurgitation. - Left atrium: The atrium was mildly dilated. - Atrial septum: No defect or patent foramen ovale was identified.   Venous duplex 8/14 no DVT   Cath 07/13/13 by Dr Burt Knack no significant AS and no CAD  Left mainstem: No  stenosis, arises from left cusp.  Left anterior descending (LAD): Patent to the distal anterior wall. No significant stenosis throughout  Left circumflex (LCx): Widely patent. OM2 and OM3 are the major branches and they have no stenosis.  Right coronary artery (RCA): normal caliber vessel with mild 20-30% mid-vessel stenosis. PDA is large without significant disease.  Left ventriculography: Left ventricular systolic function is moderately depressed (global). The LVEF is estimated at 35-40%.  Final Conclusions:  1. Minimal nonobstructive CAD   2. No significant aortic stenosis (no transvalvular gradient with simultaneous pressures)  3. Moderately severe global LV dysfunction  Recommendations: med Rx for cardiomyopathy.   Right heart pressures ok with no pulmonary hypertension  Hemodynamics  RA 9  RV 36/9  PA 40/14 mean 27  PCWP 17  LV 122/11  AO 122/74 mean 89  Oxygen saturations:  PA 63  AO 91  Cardiac Output (Fick) 4.4  Cardiac Index (Fick) 2.63     Assessment/Plan: 1. Aortic stenosis - not significant based on cardiac cath from 2014 mild by recent echo 11/28/15   2. HTN - BP higher for me. She does not check at home. May need to restart Losartan.  3. NICM - marked improvement based on 2017 echo continue medical Rx   4. COPD/chronic DOE - on oxygen. Followed by pulmonary. No active wheezing   5. Situation stress - she is primary caregiver to her husband. This will not be ideal.   6. PAF:  Continue atenolol and eliquis NSR today if she has high HR or palpitations will get Event monitor   Current medicines are reviewed with the patient today.  The patient does not have concerns regarding medicines other than what has been noted above.   Jenkins Rouge

## 2016-12-23 ENCOUNTER — Encounter: Payer: Self-pay | Admitting: Cardiovascular Disease

## 2016-12-23 ENCOUNTER — Ambulatory Visit (INDEPENDENT_AMBULATORY_CARE_PROVIDER_SITE_OTHER): Payer: Medicare Other | Admitting: Cardiovascular Disease

## 2016-12-23 VITALS — BP 140/86 | HR 86 | Ht 62.0 in | Wt 121.4 lb

## 2016-12-23 DIAGNOSIS — I480 Paroxysmal atrial fibrillation: Secondary | ICD-10-CM

## 2016-12-23 NOTE — Patient Instructions (Addendum)
Medication Instructions:  Your physician recommends that you continue on your current medications as directed. Please refer to the Current Medication list given to you today.  Labwork: NONE  Testing/Procedures: NONE  Follow-Up: Your physician wants you to follow-up in: 3 months with Dr. Nishan.   If you need a refill on your cardiac medications before your next appointment, please call your pharmacy.    

## 2016-12-25 ENCOUNTER — Encounter (HOSPITAL_COMMUNITY): Payer: Self-pay

## 2016-12-25 ENCOUNTER — Emergency Department (HOSPITAL_COMMUNITY): Payer: Medicare Other

## 2016-12-25 ENCOUNTER — Inpatient Hospital Stay (HOSPITAL_COMMUNITY)
Admission: EM | Admit: 2016-12-25 | Discharge: 2017-01-19 | DRG: 208 | Disposition: E | Payer: Medicare Other | Attending: Pulmonary Disease | Admitting: Pulmonary Disease

## 2016-12-25 DIAGNOSIS — E43 Unspecified severe protein-calorie malnutrition: Secondary | ICD-10-CM | POA: Diagnosis not present

## 2016-12-25 DIAGNOSIS — J9622 Acute and chronic respiratory failure with hypercapnia: Secondary | ICD-10-CM | POA: Diagnosis not present

## 2016-12-25 DIAGNOSIS — I959 Hypotension, unspecified: Secondary | ICD-10-CM

## 2016-12-25 DIAGNOSIS — Z7901 Long term (current) use of anticoagulants: Secondary | ICD-10-CM

## 2016-12-25 DIAGNOSIS — R0602 Shortness of breath: Secondary | ICD-10-CM | POA: Diagnosis not present

## 2016-12-25 DIAGNOSIS — B9689 Other specified bacterial agents as the cause of diseases classified elsewhere: Secondary | ICD-10-CM | POA: Diagnosis present

## 2016-12-25 DIAGNOSIS — G931 Anoxic brain damage, not elsewhere classified: Secondary | ICD-10-CM | POA: Diagnosis not present

## 2016-12-25 DIAGNOSIS — I5032 Chronic diastolic (congestive) heart failure: Secondary | ICD-10-CM | POA: Diagnosis present

## 2016-12-25 DIAGNOSIS — Z9981 Dependence on supplemental oxygen: Secondary | ICD-10-CM

## 2016-12-25 DIAGNOSIS — M069 Rheumatoid arthritis, unspecified: Secondary | ICD-10-CM | POA: Diagnosis present

## 2016-12-25 DIAGNOSIS — Z781 Physical restraint status: Secondary | ICD-10-CM

## 2016-12-25 DIAGNOSIS — J210 Acute bronchiolitis due to respiratory syncytial virus: Secondary | ICD-10-CM | POA: Diagnosis not present

## 2016-12-25 DIAGNOSIS — I10 Essential (primary) hypertension: Secondary | ICD-10-CM | POA: Diagnosis present

## 2016-12-25 DIAGNOSIS — Z22322 Carrier or suspected carrier of Methicillin resistant Staphylococcus aureus: Secondary | ICD-10-CM

## 2016-12-25 DIAGNOSIS — I480 Paroxysmal atrial fibrillation: Secondary | ICD-10-CM | POA: Diagnosis present

## 2016-12-25 DIAGNOSIS — Z978 Presence of other specified devices: Secondary | ICD-10-CM

## 2016-12-25 DIAGNOSIS — I509 Heart failure, unspecified: Secondary | ICD-10-CM

## 2016-12-25 DIAGNOSIS — Z4659 Encounter for fitting and adjustment of other gastrointestinal appliance and device: Secondary | ICD-10-CM

## 2016-12-25 DIAGNOSIS — J441 Chronic obstructive pulmonary disease with (acute) exacerbation: Secondary | ICD-10-CM | POA: Diagnosis present

## 2016-12-25 DIAGNOSIS — Z87891 Personal history of nicotine dependence: Secondary | ICD-10-CM | POA: Diagnosis not present

## 2016-12-25 DIAGNOSIS — Z79899 Other long term (current) drug therapy: Secondary | ICD-10-CM

## 2016-12-25 DIAGNOSIS — R739 Hyperglycemia, unspecified: Secondary | ICD-10-CM | POA: Diagnosis not present

## 2016-12-25 DIAGNOSIS — R05 Cough: Secondary | ICD-10-CM | POA: Diagnosis not present

## 2016-12-25 DIAGNOSIS — Z515 Encounter for palliative care: Secondary | ICD-10-CM | POA: Diagnosis not present

## 2016-12-25 DIAGNOSIS — J9610 Chronic respiratory failure, unspecified whether with hypoxia or hypercapnia: Secondary | ICD-10-CM | POA: Diagnosis not present

## 2016-12-25 DIAGNOSIS — R0902 Hypoxemia: Secondary | ICD-10-CM

## 2016-12-25 DIAGNOSIS — F419 Anxiety disorder, unspecified: Secondary | ICD-10-CM | POA: Diagnosis present

## 2016-12-25 DIAGNOSIS — E876 Hypokalemia: Secondary | ICD-10-CM | POA: Diagnosis present

## 2016-12-25 DIAGNOSIS — R069 Unspecified abnormalities of breathing: Secondary | ICD-10-CM | POA: Diagnosis not present

## 2016-12-25 DIAGNOSIS — G9340 Encephalopathy, unspecified: Secondary | ICD-10-CM

## 2016-12-25 DIAGNOSIS — Z7952 Long term (current) use of systemic steroids: Secondary | ICD-10-CM

## 2016-12-25 DIAGNOSIS — G629 Polyneuropathy, unspecified: Secondary | ICD-10-CM | POA: Diagnosis present

## 2016-12-25 DIAGNOSIS — E785 Hyperlipidemia, unspecified: Secondary | ICD-10-CM | POA: Diagnosis present

## 2016-12-25 DIAGNOSIS — J9601 Acute respiratory failure with hypoxia: Secondary | ICD-10-CM | POA: Diagnosis not present

## 2016-12-25 DIAGNOSIS — J9621 Acute and chronic respiratory failure with hypoxia: Secondary | ICD-10-CM | POA: Diagnosis present

## 2016-12-25 DIAGNOSIS — J9691 Respiratory failure, unspecified with hypoxia: Secondary | ICD-10-CM

## 2016-12-25 DIAGNOSIS — E872 Acidosis: Secondary | ICD-10-CM | POA: Diagnosis present

## 2016-12-25 DIAGNOSIS — J9692 Respiratory failure, unspecified with hypercapnia: Secondary | ICD-10-CM

## 2016-12-25 DIAGNOSIS — Z66 Do not resuscitate: Secondary | ICD-10-CM | POA: Diagnosis not present

## 2016-12-25 DIAGNOSIS — T380X5A Adverse effect of glucocorticoids and synthetic analogues, initial encounter: Secondary | ICD-10-CM | POA: Diagnosis not present

## 2016-12-25 DIAGNOSIS — G35 Multiple sclerosis: Secondary | ICD-10-CM | POA: Diagnosis present

## 2016-12-25 DIAGNOSIS — I110 Hypertensive heart disease with heart failure: Secondary | ICD-10-CM | POA: Diagnosis present

## 2016-12-25 DIAGNOSIS — K219 Gastro-esophageal reflux disease without esophagitis: Secondary | ICD-10-CM | POA: Diagnosis present

## 2016-12-25 DIAGNOSIS — Z6822 Body mass index (BMI) 22.0-22.9, adult: Secondary | ICD-10-CM

## 2016-12-25 DIAGNOSIS — Z01818 Encounter for other preprocedural examination: Secondary | ICD-10-CM

## 2016-12-25 DIAGNOSIS — Z7951 Long term (current) use of inhaled steroids: Secondary | ICD-10-CM

## 2016-12-25 DIAGNOSIS — I350 Nonrheumatic aortic (valve) stenosis: Secondary | ICD-10-CM | POA: Diagnosis present

## 2016-12-25 LAB — I-STAT VENOUS BLOOD GAS, ED
ACID-BASE EXCESS: 10 mmol/L — AB (ref 0.0–2.0)
BICARBONATE: 37.6 mmol/L — AB (ref 20.0–28.0)
O2 Saturation: 57 %
PCO2 VEN: 61.7 mmHg — AB (ref 44.0–60.0)
PH VEN: 7.393 (ref 7.250–7.430)
PO2 VEN: 31 mmHg — AB (ref 32.0–45.0)
TCO2: 39 mmol/L (ref 0–100)

## 2016-12-25 LAB — BASIC METABOLIC PANEL
Anion gap: 10 (ref 5–15)
BUN: 13 mg/dL (ref 6–20)
CHLORIDE: 94 mmol/L — AB (ref 101–111)
CO2: 34 mmol/L — ABNORMAL HIGH (ref 22–32)
Calcium: 9 mg/dL (ref 8.9–10.3)
Creatinine, Ser: 0.64 mg/dL (ref 0.44–1.00)
GFR calc Af Amer: >60 mL/min (ref >60)
GFR calc non Af Amer: >60 mL/min (ref >60)
GLUCOSE: 90 mg/dL (ref 65–99)
POTASSIUM: 3.4 mmol/L — AB (ref 3.5–5.1)
Sodium: 138 mmol/L (ref 135–145)

## 2016-12-25 LAB — RESPIRATORY PANEL BY PCR
Adenovirus: NOT DETECTED
BORDETELLA PERTUSSIS-RVPCR: NOT DETECTED
CHLAMYDOPHILA PNEUMONIAE-RVPPCR: NOT DETECTED
Coronavirus 229E: NOT DETECTED
Coronavirus HKU1: NOT DETECTED
Coronavirus NL63: NOT DETECTED
Coronavirus OC43: NOT DETECTED
INFLUENZA A-RVPPCR: NOT DETECTED
Influenza B: NOT DETECTED
MYCOPLASMA PNEUMONIAE-RVPPCR: NOT DETECTED
Metapneumovirus: NOT DETECTED
PARAINFLUENZA VIRUS 3-RVPPCR: NOT DETECTED
PARAINFLUENZA VIRUS 4-RVPPCR: NOT DETECTED
Parainfluenza Virus 1: NOT DETECTED
Parainfluenza Virus 2: NOT DETECTED
RHINOVIRUS / ENTEROVIRUS - RVPPCR: NOT DETECTED
Respiratory Syncytial Virus: DETECTED — AB

## 2016-12-25 LAB — CBC WITH DIFFERENTIAL/PLATELET
Basophils Absolute: 0 10*3/uL (ref 0.0–0.1)
Basophils Relative: 0 %
EOS PCT: 1 %
Eosinophils Absolute: 0.1 10*3/uL (ref 0.0–0.7)
HEMATOCRIT: 43.3 % (ref 36.0–46.0)
Hemoglobin: 14 g/dL (ref 12.0–15.0)
LYMPHS PCT: 13 %
Lymphs Abs: 1.9 10*3/uL (ref 0.7–4.0)
MCH: 29.8 pg (ref 26.0–34.0)
MCHC: 32.3 g/dL (ref 30.0–36.0)
MCV: 92.1 fL (ref 78.0–100.0)
MONO ABS: 1.7 10*3/uL — AB (ref 0.1–1.0)
Monocytes Relative: 12 %
NEUTROS ABS: 10.4 10*3/uL — AB (ref 1.7–7.7)
Neutrophils Relative %: 74 %
PLATELETS: 229 10*3/uL (ref 150–400)
RBC: 4.7 MIL/uL (ref 3.87–5.11)
RDW: 15.6 % — ABNORMAL HIGH (ref 11.5–15.5)
WBC: 14.1 10*3/uL — AB (ref 4.0–10.5)

## 2016-12-25 LAB — I-STAT TROPONIN, ED: Troponin i, poc: 0.01 ng/mL (ref 0.00–0.08)

## 2016-12-25 LAB — MRSA PCR SCREENING: MRSA BY PCR: POSITIVE — AB

## 2016-12-25 LAB — BRAIN NATRIURETIC PEPTIDE: B Natriuretic Peptide: 173.8 pg/mL — ABNORMAL HIGH (ref 0.0–100.0)

## 2016-12-25 LAB — MAGNESIUM: MAGNESIUM: 1.8 mg/dL (ref 1.7–2.4)

## 2016-12-25 MED ORDER — METHYLPREDNISOLONE SODIUM SUCC 125 MG IJ SOLR: 80.0000 mg | Freq: Three times a day (TID) | INTRAMUSCULAR | Status: DC

## 2016-12-25 MED ORDER — MAGNESIUM SULFATE 2 GM/50ML IV SOLN
2.0000 g | Freq: Once | INTRAVENOUS | Status: AC
  Administered 2016-12-25: 2 g via INTRAVENOUS
  Filled 2016-12-25: qty 50

## 2016-12-25 MED ORDER — ACETAMINOPHEN 650 MG RE SUPP: 650.0000 mg | Freq: Four times a day (QID) | RECTAL | Status: DC | PRN

## 2016-12-25 MED ORDER — ONDANSETRON HCL 4 MG PO TABS
4.0000 mg | ORAL_TABLET | Freq: Four times a day (QID) | ORAL | Status: DC | PRN
Start: 2016-12-25 — End: 2016-12-29

## 2016-12-25 MED ORDER — POTASSIUM CHLORIDE CRYS ER 20 MEQ PO TBCR
30.0000 meq | EXTENDED_RELEASE_TABLET | Freq: Two times a day (BID) | ORAL | Status: AC
  Administered 2016-12-25 – 2016-12-26 (×2): 30 meq via ORAL
  Filled 2016-12-25 (×2): qty 1

## 2016-12-25 MED ORDER — ONDANSETRON HCL 4 MG/2ML IJ SOLN: 4.0000 mg | Freq: Four times a day (QID) | INTRAMUSCULAR | Status: DC | PRN

## 2016-12-25 MED ORDER — SODIUM CHLORIDE 0.9% FLUSH
3.0000 mL | Freq: Two times a day (BID) | INTRAVENOUS | Status: DC
  Administered 2016-12-25 – 2017-01-01 (×12): 3 mL via INTRAVENOUS

## 2016-12-25 MED ORDER — ATENOLOL 50 MG PO TABS
50.0000 mg | ORAL_TABLET | Freq: Every day | ORAL | Status: DC
  Administered 2016-12-26 – 2016-12-27 (×2): 50 mg via ORAL
  Filled 2016-12-25 (×3): qty 1

## 2016-12-25 MED ORDER — ALPRAZOLAM 0.25 MG PO TABS
0.2500 mg | ORAL_TABLET | Freq: Every day | ORAL | Status: DC
  Administered 2016-12-25 – 2016-12-27 (×3): 0.25 mg via ORAL
  Filled 2016-12-25 (×3): qty 1

## 2016-12-25 MED ORDER — MAGNESIUM SULFATE 50 % IJ SOLN: 1.0000 g | Freq: Once | INTRAMUSCULAR | Status: DC

## 2016-12-25 MED ORDER — TRAZODONE HCL 50 MG PO TABS
25.0000 mg | ORAL_TABLET | Freq: Every evening | ORAL | Status: DC | PRN
  Administered 2016-12-27: 25 mg via ORAL
  Filled 2016-12-25: qty 1

## 2016-12-25 MED ORDER — APIXABAN 5 MG PO TABS
5.0000 mg | ORAL_TABLET | Freq: Two times a day (BID) | ORAL | Status: DC
  Administered 2016-12-25 – 2016-12-26 (×2): 5 mg via ORAL
  Filled 2016-12-25 (×2): qty 1

## 2016-12-25 MED ORDER — MUPIROCIN 2 % EX OINT
1.0000 "application " | TOPICAL_OINTMENT | Freq: Two times a day (BID) | CUTANEOUS | Status: AC
  Administered 2016-12-25 – 2016-12-30 (×9): 1 via NASAL
  Filled 2016-12-25 (×4): qty 22

## 2016-12-25 MED ORDER — DEXTROSE 5 % IV SOLN
1.0000 g | INTRAVENOUS | Status: DC
  Filled 2016-12-25: qty 10

## 2016-12-25 MED ORDER — MAGNESIUM SULFATE IN D5W 1-5 GM/100ML-% IV SOLN
1.0000 g | Freq: Once | INTRAVENOUS | Status: AC
  Administered 2016-12-25: 1 g via INTRAVENOUS
  Filled 2016-12-25: qty 100

## 2016-12-25 MED ORDER — ALBUTEROL SULFATE (2.5 MG/3ML) 0.083% IN NEBU
5.0000 mg | INHALATION_SOLUTION | Freq: Once | RESPIRATORY_TRACT | Status: AC
  Administered 2016-12-25: 5 mg via RESPIRATORY_TRACT
  Filled 2016-12-25: qty 6

## 2016-12-25 MED ORDER — PANTOPRAZOLE SODIUM 40 MG PO TBEC
40.0000 mg | DELAYED_RELEASE_TABLET | Freq: Every day | ORAL | Status: DC
  Administered 2016-12-26 – 2016-12-29 (×3): 40 mg via ORAL
  Filled 2016-12-25 (×3): qty 1

## 2016-12-25 MED ORDER — ACETAMINOPHEN 325 MG PO TABS
650.0000 mg | ORAL_TABLET | Freq: Four times a day (QID) | ORAL | Status: DC | PRN
  Administered 2016-12-27: 650 mg via ORAL
  Filled 2016-12-25: qty 2

## 2016-12-25 MED ORDER — IPRATROPIUM BROMIDE 0.02 % IN SOLN
0.5000 mg | RESPIRATORY_TRACT | Status: DC | PRN
  Administered 2016-12-27 – 2016-12-28 (×7): 0.5 mg via RESPIRATORY_TRACT
  Filled 2016-12-25 (×6): qty 2.5

## 2016-12-25 MED ORDER — LEVALBUTEROL HCL 0.63 MG/3ML IN NEBU
0.6300 mg | INHALATION_SOLUTION | RESPIRATORY_TRACT | Status: DC
  Administered 2016-12-25 – 2016-12-26 (×6): 0.63 mg via RESPIRATORY_TRACT
  Filled 2016-12-25 (×6): qty 3

## 2016-12-25 MED ORDER — POLYETHYLENE GLYCOL 3350 17 G PO PACK
17.0000 g | PACK | Freq: Every day | ORAL | Status: DC | PRN
  Filled 2016-12-25: qty 1

## 2016-12-25 MED ORDER — DEXTROSE 5 % IV SOLN
500.0000 mg | INTRAVENOUS | Status: DC
  Filled 2016-12-25: qty 500

## 2016-12-25 MED ORDER — GABAPENTIN 300 MG PO CAPS
300.0000 mg | ORAL_CAPSULE | Freq: Four times a day (QID) | ORAL | Status: DC
  Administered 2016-12-25 – 2016-12-27 (×10): 300 mg via ORAL
  Filled 2016-12-25 (×10): qty 1

## 2016-12-25 MED ORDER — HYDROCHLOROTHIAZIDE 25 MG PO TABS: 25.0000 mg | ORAL_TABLET | Freq: Every day | ORAL | Status: DC

## 2016-12-25 MED ORDER — IPRATROPIUM BROMIDE 0.02 % IN SOLN
0.5000 mg | Freq: Once | RESPIRATORY_TRACT | Status: AC
  Administered 2016-12-25: 0.5 mg via RESPIRATORY_TRACT
  Filled 2016-12-25: qty 2.5

## 2016-12-25 MED ORDER — METHYLPREDNISOLONE SODIUM SUCC 125 MG IJ SOLR: 125.0000 mg | Freq: Once | INTRAMUSCULAR | Status: DC

## 2016-12-25 MED ORDER — SIMVASTATIN 20 MG PO TABS
20.0000 mg | ORAL_TABLET | Freq: Every day | ORAL | Status: DC
  Administered 2016-12-26 – 2016-12-27 (×2): 20 mg via ORAL
  Filled 2016-12-25 (×2): qty 1

## 2016-12-25 MED ORDER — CHLORHEXIDINE GLUCONATE CLOTH 2 % EX PADS
6.0000 | MEDICATED_PAD | Freq: Every day | CUTANEOUS | Status: AC
  Administered 2016-12-26 – 2016-12-30 (×5): 6 via TOPICAL

## 2016-12-25 MED ORDER — POTASSIUM CHLORIDE CRYS ER 20 MEQ PO TBCR
40.0000 meq | EXTENDED_RELEASE_TABLET | Freq: Once | ORAL | Status: AC
  Administered 2016-12-25: 40 meq via ORAL
  Filled 2016-12-25: qty 2

## 2016-12-25 MED ORDER — GUAIFENESIN ER 600 MG PO TB12: 1200.0000 mg | ORAL_TABLET | Freq: Two times a day (BID) | ORAL | Status: DC

## 2016-12-25 MED ORDER — HYDROCODONE-ACETAMINOPHEN 5-325 MG PO TABS
1.0000 | ORAL_TABLET | ORAL | Status: DC | PRN
  Administered 2016-12-26: 1 via ORAL
  Filled 2016-12-25: qty 2

## 2016-12-25 MED ORDER — DEXTROSE 5 % IV SOLN
1.0000 g | INTRAVENOUS | Status: AC
  Administered 2016-12-25 – 2016-12-29 (×5): 1 g via INTRAVENOUS
  Filled 2016-12-25 (×5): qty 10

## 2016-12-25 MED ORDER — IPRATROPIUM BROMIDE 0.02 % IN SOLN
0.5000 mg | RESPIRATORY_TRACT | Status: DC
  Administered 2016-12-25 – 2016-12-26 (×6): 0.5 mg via RESPIRATORY_TRACT
  Filled 2016-12-25 (×6): qty 2.5

## 2016-12-25 MED ORDER — METHYLPREDNISOLONE SODIUM SUCC 125 MG IJ SOLR
80.0000 mg | Freq: Four times a day (QID) | INTRAMUSCULAR | Status: DC
  Administered 2016-12-25 – 2016-12-26 (×4): 80 mg via INTRAVENOUS
  Filled 2016-12-25 (×4): qty 2

## 2016-12-25 NOTE — ED Provider Notes (Addendum)
[facility] DEPT Provider Note   CSN: [id number] Arrival date & time: 12/26/2016  1224     History   Chief Complaint Chief Complaint  Patient presents with  . Shortness of Breath    HPI [name] is a 83 y.o. female who presents the emergency department from her primary care physician's office for shortness of breath. Patient states that she had severe difficulty breathing today. She took her normal home in nebulizer treatments without significant effect. She has a history of COPD. She has home oxygen which she uses as needed, mostly at night. The patient states that about 2 weeks ago she felt like she might have the flu. She was seen in the emergency department and diagnosed with paroxysmal atrial fibrillation. The patient followed up with Dr. Johnsie Cancel and had her medication changed. Patient states that since that time she has had symptoms of laryngitis, productive cough with thick brown sputum, and progressively worsening wheezing and shortness of breath. EMS reports that upon arrival, they found her oxygen saturations to be at 86% on room air. She has had one nebulizer treatment at her doctor's office. En route she got IV Solu-Medrol and 2, 5 mg doses of albuterol. She did some improvement in her breathing, but however, her breathing is still very labored. The patient denies fevers or chills. She denies urinary symptoms, abdominal pain. Patient denies chest pain. HPI  Past Medical History:  Diagnosis Date  . Allergic rhinitis    takes Mucinex daily  . Anxiety    takes Alprazolam daily  . Aortic stenosis    mild by echo (2008)  . Asthma    uses Albuterol daily as needed. Pulmicort and Perforomist neb daily  . Bronchitis, chronic obstructive, with exacerbation ([place])   . Bruises easily   . Cataract    right  . Chronic back pain    HNP  . COPD (chronic obstructive pulmonary disease) (HCC)    Xopenex and Atrobent neb daily   . GERD (gastroesophageal reflux disease)    takes  Omeprazole daily  . Hemorrhoid   . History of bronchitis    many yrs ago  . History of colon polyps   . History of gout    no meds required  . History of staph infection   . Hyperlipidemia    takes Simvastatin daily  . Hypertension    takes Losartan daily  . Joint pain   . Joint swelling   . Multiple sclerosis ([place])   . Paroxysmal atrial fibrillation ([place]) 11/08  . Peripheral edema   . Pneumonia    hx of-as a baby  . PONV (postoperative nausea and vomiting)   . Rheumatoid arthritis(714.0)   . Shortness of breath    with exertion  . Stasis dermatitis     Patient Active Problem List   Diagnosis Date Noted  . Acute respiratory failure with hypoxia ([place]) 01/02/2017  . Thrush 08/18/2014  . HNP (herniated nucleus pulposus), lumbar 12/15/2013  . Sinusitis, acute maxillary 10/21/2013  . CHF (congestive heart failure) ([place]) 06/16/2013  . Acute exacerbation of chronic obstructive pulmonary disease (COPD) ([place]) 12/08/2011  . Dyslipidemia 09/08/2011  . Anxiety 09/08/2011  . PAF (paroxysmal atrial fibrillation) ([place]) 01/06/2011  . Other specified cardiac dysrhythmias(427.89) 06/27/2010  . COLD SORE 10/22/2009  . DYSPNEA 06/05/2009  . Situational depression 06/04/2009  . Aortic stenosis 06/04/2009  . GERD 06/04/2009  . DERMATITIS 06/04/2009  . Seasonal and perennial allergic rhinitis 12/09/2007  . Essential hypertension 11/10/2007  .  Rheumatoid arthritis(714.0) 11/10/2007  . Multiple sclerosis ([place]) 07/01/2007  . STASIS DERMATITIS 07/01/2007  . COPD mixed type ([place]) 07/01/2007    Past Surgical History:  Procedure Laterality Date  . ABDOMINAL HYSTERECTOMY    . BREAST CYST EXCISION    . cataract surgery Left   . COLONOSCOPY    . LEFT AND RIGHT HEART CATHETERIZATION WITH CORONARY ANGIOGRAM N/A 07/13/2013   Procedure: LEFT AND RIGHT HEART CATHETERIZATION WITH CORONARY ANGIOGRAM;  Surgeon: Blane Ohara, MD;  Location: [facility] CATH LAB;  Service: Cardiovascular;  Laterality:  N/A;  . LUMBAR DISC SURGERY  2003   L5  . LUMBAR LAMINECTOMY/DECOMPRESSION MICRODISCECTOMY Right 12/15/2013   Procedure: LUMBAR LAMINECTOMY/DECOMPRESSION MICRODISCECTOMY 1 LEVEL two/three;  Surgeon: Winfield Cunas, MD;  Location: [facility] NEURO ORS;  Service: Neurosurgery;  Laterality: Right;  Right L23 microdiskectomy    OB History    No data available       Home Medications    Prior to Admission medications   Medication Sig Start Date End Date Taking? Authorizing Provider  ANORO ELLIPTA 62.5-25 MCG/INH AEPB INHALE 1 PUFF INTO THE LUNGS DAILY. 12/07/16  Yes Deneise Lever, MD  apixaban (ELIQUIS) 5 MG TABS tablet Take 1 tablet (5 mg total) by mouth 2 (two) times daily. 12/15/16  Yes Virgel Manifold, MD  atenolol (TENORMIN) 50 MG tablet Take 1 tablet (50 mg total) by mouth daily. 12/15/16  Yes Virgel Manifold, MD  formoterol (PERFOROMIST) 20 MCG/2ML nebulizer solution USE 1 VIAL IN NEBULIZER TWICE A DAY 08/21/16  Yes Deneise Lever, MD  gabapentin (NEURONTIN) 300 MG capsule Take 300 mg by mouth 4 (four) times daily.  01/19/16  Yes Historical Provider, MD  hydrochlorothiazide 25 MG tablet Take 25 mg by mouth daily.    Yes Historical Provider, MD  ipratropium (ATROVENT) 0.02 % nebulizer solution Take 2.5 mLs (0.5 mg total) by nebulization 3 (three) times daily as needed for wheezing or shortness of breath. DX code J44.9. 08/19/16  Yes Deneise Lever, MD  levalbuterol (XOPENEX) 0.63 MG/3ML nebulizer solution Take 3 mLs (0.63 mg total) by nebulization every 6 (six) hours as needed for wheezing or shortness of breath. 06/30/16  Yes Deneise Lever, MD  omeprazole (PRILOSEC) 20 MG capsule Take 20 mg by mouth daily.   Yes Historical Provider, MD  ALPRAZolam (XANAX) 0.25 MG tablet Take 0.25 mg by mouth daily.    Historical Provider, MD  budesonide (PULMICORT) 0.25 MG/2ML nebulizer solution USE 1 VIAL IN NEBULIZER TWICE A DAY 12/31/15   Deneise Lever, MD  guaiFENesin (MUCINEX) 600 MG 12 hr tablet Take 600 mg  by mouth 2 (two) times daily as needed for cough or to loosen phlegm.     Historical Provider, MD  predniSONE (DELTASONE) 10 MG tablet Take 10 mg by mouth 2 (two) times daily.    Historical Provider, MD  PROAIR HFA 108 (90 BASE) MCG/ACT inhaler INHALE 2 PUFFS BY MOUTH EVERY 6 HOURS AS NEEDED FOR WHEEZING OR SHORTNESS OF BREATH 11/15/14   Deneise Lever, MD  simvastatin (ZOCOR) 20 MG tablet Take 20 mg by mouth daily.     Historical Provider, MD    Family History Family History  Problem Relation Age of Onset  . Asthma Mother   . Emphysema Father   . Alzheimer's disease Brother   . Colon cancer Other     sibling  . Heart disease Other     sibling    Social History Social History  Substance Use  Topics  . Smoking status: Former Smoker    Packs/day: 1.00    Years: 25.00    Types: Cigarettes    Quit date: 01/12/1989  . Smokeless tobacco: Never Used     Comment: quit 51yrs ago  . Alcohol use No     Allergies   Amoxicillin-pot clavulanate; Daliresp [roflumilast]; Diltiazem; Montelukast sodium; Zafirlukast; and Adhesive [tape]   Review of Systems Review of Systems Ten systems reviewed and are negative for acute change, except as noted in the HPI.    Physical Exam Updated Vital Signs BP (!) 111/98   Pulse 95   Resp (!) 26   SpO2 93%   Physical Exam  Constitutional: She is oriented to person, place, and time. She appears well-developed and well-nourished. No distress.  Labored breathing  HENT:  Head: Normocephalic and atraumatic.  Eyes: Conjunctivae are normal. No scleral icterus.  Neck: Normal range of motion.  Cardiovascular: Normal rate, regular rhythm and normal heart sounds.  Exam reveals no gallop and no friction rub.   No murmur heard. Pulmonary/Chest: She has wheezes.  Labored breathing, diffuse wheezing  Abdominal: Soft. Bowel sounds are normal. She exhibits no distension and no mass. There is no tenderness. There is no guarding.  Neurological: She is alert and  oriented to person, place, and time.  Skin: Skin is warm and dry. She is not diaphoretic.  Psychiatric: Her behavior is normal.  Nursing note and vitals reviewed.    ED Treatments / Results  Labs (all labs ordered are listed, but only abnormal results are displayed) Labs Reviewed  BASIC METABOLIC PANEL - Abnormal; Notable for the following:       Result Value   Potassium 3.4 (*)    Chloride 94 (*)    CO2 34 (*)    All other components within normal limits  CBC WITH DIFFERENTIAL/PLATELET - Abnormal; Notable for the following:    WBC 14.1 (*)    RDW 15.6 (*)    Neutro Abs 10.4 (*)    Monocytes Absolute 1.7 (*)    All other components within normal limits  BRAIN NATRIURETIC PEPTIDE - Abnormal; Notable for the following:    B Natriuretic Peptide 173.8 (*)    All other components within normal limits  I-STAT VENOUS BLOOD GAS, ED - Abnormal; Notable for the following:    pCO2, Ven 61.7 (*)    pO2, Ven 31.0 (*)    Bicarbonate 37.6 (*)    Acid-Base Excess 10.0 (*)    All other components within normal limits  BLOOD GAS, VENOUS  I-STAT TROPOININ, ED    EKG  EKG Interpretation  Date/Time:  Friday December 25 2016 12:58:27 EDT Ventricular Rate:  103 PR Interval:    QRS Duration: 85 QT Interval:  332 QTC Calculation: 411 R Axis:   -63 Text Interpretation:  Ectopic atrial tachycardia, unifocal Multiple ventricular premature complexes, new since prior tracing Left anterior fascicular block Borderline low voltage, extremity leads Consider anterior infarct Confirmed by KNAPP  MD-J, JON ([id number]) on 01/08/2017 1:31:44 PM       Radiology Dg Chest 2 View  Result Date: 01/13/2017 CLINICAL DATA:  Shortness of breath, productive cough x3 days EXAM: CHEST  2 VIEW COMPARISON:  None. FINDINGS: Lungs are clear. Minimal blunting of the left costophrenic angle, favored to reflect mild scarring/pleural thickening. No pneumothorax. The heart is normal in size. Visualized osseous structures are within  normal limits. IMPRESSION: No evidence of acute cardiopulmonary disease. Electronically Signed   By: Bertis Ruddy  [place] Pink M.D.   On: 12/28/2016 13:39    Procedures Procedures (including critical care time)  Medications Ordered in ED Medications  methylPREDNISolone sodium succinate (SOLU-MEDROL) 125 mg/2 mL injection 80 mg (not administered)  albuterol (PROVENTIL) (2.5 MG/3ML) 0.083% nebulizer solution 5 mg (5 mg Nebulization Given 01/09/2017 1241)  ipratropium (ATROVENT) nebulizer solution 0.5 mg (0.5 mg Nebulization Given 12/22/2016 1241)  magnesium sulfate IVPB 2 g 50 mL (0 g Intravenous Stopped 12/22/2016 1345)  albuterol (PROVENTIL) (2.5 MG/3ML) 0.083% nebulizer solution 5 mg (5 mg Nebulization Given 12/26/2016 1455)     Initial Impression / Assessment and Plan / ED Course  I have reviewed the triage vital signs and the nursing notes.  Pertinent labs & imaging results that were available during my care of the patient were reviewed by me and considered in my medical decision making (see chart for details).  Clinical Course as of Dec 25 1621  Fri Dec 25, 2016  1408 Patient continues to be sob, with increased work of breathing and prolonged expiratory phase. Patient's chest x-ray is reviewed and is negative. I feel this episode represents a COPD exacerbation. I sat with the patient for approximately 2 minutes and took her oxygen off. The patient desaturated to 88%. She does not normally have hypoxia or require oxygen. Patient does have home oxygen, however, is still having significant work of breathing and I feel will need an admission. I have opted to do a repeat 5 mg albuterol treatments instead of an hour-long neb treatment. Considering her recent history of A. fib. The patient agrees to an admission at this time.  [AH]    Clinical Course User Index [AH] Margarita Mail, PA-C    Patient was COPD exacerbation. Patient will be admitted by the hospitalist service. She is improving throughout her ED  course.  Final Clinical Impressions(s) / ED Diagnoses   Final diagnoses:  COPD exacerbation ([place])  Hypoxia    New Prescriptions New Prescriptions   No medications on file     Margarita Mail, PA-C 01/08/2017 1624    Dorie Rank, MD 12/27/16 [place], PA-C 01/09/17 [place], MD 01/11/17 1329

## 2016-12-25 NOTE — Progress Notes (Signed)
Paged MD regarding pt having 8 beats of vtach. Pt nonsymptomatic, resting comfortably in bed. Awaiting call back   Leona Alen

## 2016-12-25 NOTE — Progress Notes (Signed)
Pharmacy Antibiotic Note  [name] is a 83 y.o. female admitted on 12/21/2016 with COPD exacerbation.  Also on azithromycin per MD. Pharmacy has been consulted for ceftriaxone dosing. Tmax/24h 102, WBC 14.1.  Plan: Ceftriaxone 1g IV q24h F/u clinical progress, LOT     No data recorded.   Recent Labs Lab 12/27/2016 1232  WBC 14.1*  CREATININE 0.64    Estimated Creatinine Clearance: 43.6 mL/min (by C-G formula based on SCr of 0.64 mg/dL).    Allergies  Allergen Reactions  . Amoxicillin-Pot Clavulanate Other (See Comments)    GI upset  . Daliresp [Roflumilast] Other (See Comments)    unknown  . Diltiazem Nausea Only  . Montelukast Sodium Other (See Comments)     flu-like symptoms  . Zafirlukast Other (See Comments)    unknown  . Adhesive [Tape] Rash    Please use paper tape    Elicia Lamp, PharmD, BCPS Clinical Pharmacist 01/14/2017 4:41 PM

## 2016-12-25 NOTE — Progress Notes (Signed)
Called pharmacy regarding pt medications needing verification.  [name]

## 2016-12-25 NOTE — H&P (Signed)
History and Physical    [name] ZRA:[id number] DOB: 13-May-1933 DOA: 12/31/2016  PCP: Lillard Anes, MD   Patient coming from: PCP office  Chief Complaint: increasing SOB  HPI: [name] is a 83 y.o. female with medical history significant of COPD with asthma, steroid dependent and nocturnal use of oxygen, recent diagnoses of PAF - on Eliquis, aortic stenosis, anxiety, GERD, multiple sclerosis and rheumatoid arthritis who presented to the ED via EMS with c/o progressive SOB and wheezing. Patient has been having this symptoms during the last few days and thinks that a couple of ago she actually had an undiagnosed flu. Emergency department on 12/15/2016 after she sustained a near syncopal episode that was supposed to be secondary to A. fib with RVR.   She was seen by Dr. Alden Server cardiologist on 12/23/2016 and started on Eliquis. Today patient went to see her PCP d/t worsening SOB, cough productive of brown sputum and  Wheezing. She reported a need to use her supplemental O2 more frequently than just at night. While at the PCP office her SpO2 dropped to 86% RA, she was given nebulizer treatment x1 in the office, EMS was called and patient delivered to the ED.  En route to the ED she was given andsolumedrol IV (dose is unknown) and another nebulizer treatment Upon arrival patient received two additional nebs treatments and Magneium sulfate with minimal improvement in breathing.  ED Course: Vital signs on arrival to the ED were stable except tachypnea with respirations of 24-29 breaths per minute, chest x-ray didn't show any acute cardiopulmonary disease. Blood work showed a mild hypokalemia with potassium 3.4, elevated CO2 at 34. Venous blood gas (ABG venous blood gas showed normal pH widely elevated PCO2 61.7 and a slightly reduced PO2 31 CBC revealed elevated white blood cells count-14,100 BNP was slightly elevated at 173.8 and troponin normal at 0.01 EKG shows sinus rhythm with  PVCs  Review of Systems: As per HPI otherwise 10 point review of systems negative.   Ambulatory Status: Independent  Past Medical History:  Diagnosis Date  . Allergic rhinitis    takes Mucinex daily  . Anxiety    takes Alprazolam daily  . Aortic stenosis    mild by echo (2008)  . Asthma    uses Albuterol daily as needed. Pulmicort and Perforomist neb daily  . Bronchitis, chronic obstructive, with exacerbation ([place])   . Bruises easily   . Cataract    right  . Chronic back pain    HNP  . COPD (chronic obstructive pulmonary disease) (HCC)    Xopenex and Atrobent neb daily   . GERD (gastroesophageal reflux disease)    takes Omeprazole daily  . Hemorrhoid   . History of bronchitis    many yrs ago  . History of colon polyps   . History of gout    no meds required  . History of staph infection   . Hyperlipidemia    takes Simvastatin daily  . Hypertension    takes Losartan daily  . Joint pain   . Joint swelling   . Multiple sclerosis ([place])   . Paroxysmal atrial fibrillation ([place]) 11/08  . Peripheral edema   . Pneumonia    hx of-as a baby  . PONV (postoperative nausea and vomiting)   . Rheumatoid arthritis(714.0)   . Shortness of breath    with exertion  . Stasis dermatitis     Past Surgical History:  Procedure Laterality Date  . ABDOMINAL HYSTERECTOMY    .  BREAST CYST EXCISION    . cataract surgery Left   . COLONOSCOPY    . LEFT AND RIGHT HEART CATHETERIZATION WITH CORONARY ANGIOGRAM N/A 07/13/2013   Procedure: LEFT AND RIGHT HEART CATHETERIZATION WITH CORONARY ANGIOGRAM;  Surgeon: Blane Ohara, MD;  Location: [facility] CATH LAB;  Service: Cardiovascular;  Laterality: N/A;  . LUMBAR DISC SURGERY  2003   L5  . LUMBAR LAMINECTOMY/DECOMPRESSION MICRODISCECTOMY Right 12/15/2013   Procedure: LUMBAR LAMINECTOMY/DECOMPRESSION MICRODISCECTOMY 1 LEVEL two/three;  Surgeon: Winfield Cunas, MD;  Location: [place] [facility];  Service: Neurosurgery;  Laterality: Right;  Right L23  microdiskectomy    Social History   Social History  . Marital status: Married    Spouse name: N/A  . Number of children: 2  . Years of education: N/A   Occupational History  .  Retired   Social History Main Topics  . Smoking status: Former Smoker    Packs/day: 1.00    Years: 25.00    Types: Cigarettes    Quit date: 01/12/1989  . Smokeless tobacco: Never Used     Comment: quit 22yrs ago  . Alcohol use No  . Drug use: No  . Sexual activity: No   Other Topics Concern  . Not on file   Social History Narrative  . No narrative on file    Allergies  Allergen Reactions  . Amoxicillin-Pot Clavulanate Other (See Comments)    GI upset  . Daliresp [Roflumilast] Other (See Comments)    unknown  . Diltiazem Nausea Only  . Montelukast Sodium Other (See Comments)     flu-like symptoms  . Zafirlukast Other (See Comments)    unknown  . Adhesive [Tape] Rash    Please use paper tape    Family History  Problem Relation Age of Onset  . Asthma Mother   . Emphysema Father   . Alzheimer's disease Brother   . Colon cancer Other     sibling  . Heart disease Other     sibling    Prior to Admission medications   Medication Sig Start Date End Date Taking? Authorizing Provider  ALPRAZolam (XANAX) 0.25 MG tablet Take 0.25 mg by mouth daily.    Historical Provider, MD  ANORO ELLIPTA 62.5-25 MCG/INH AEPB INHALE 1 PUFF INTO THE LUNGS DAILY. 12/07/16   Deneise Lever, MD  apixaban (ELIQUIS) 5 MG TABS tablet Take 1 tablet (5 mg total) by mouth 2 (two) times daily. 12/15/16   Virgel Manifold, MD  atenolol (TENORMIN) 50 MG tablet Take 1 tablet (50 mg total) by mouth daily. 12/15/16   Virgel Manifold, MD  azelastine (ASTELIN) 0.1 % nasal spray 1-2 puffs each nostril once or twice daily, 07/29/15   Deneise Lever, MD  budesonide (PULMICORT) 0.25 MG/2ML nebulizer solution USE 1 VIAL IN NEBULIZER TWICE A DAY 12/31/15   Deneise Lever, MD  formoterol (PERFOROMIST) 20 MCG/2ML nebulizer solution USE 1  VIAL IN NEBULIZER TWICE A DAY 08/21/16   Deneise Lever, MD  gabapentin (NEURONTIN) 300 MG capsule Take 300 mg by mouth 4 (four) times daily.  01/19/16   Historical Provider, MD  guaiFENesin (MUCINEX) 600 MG 12 hr tablet Take 600 mg by mouth 2 (two) times daily as needed for cough or to loosen phlegm.     Historical Provider, MD  hydrochlorothiazide 25 MG tablet Take 25 mg by mouth daily.     Historical Provider, MD  ipratropium (ATROVENT) 0.02 % nebulizer solution Take 2.5 mLs (0.5 mg total) by nebulization  3 (three) times daily as needed for wheezing or shortness of breath. DX code J44.9. 08/19/16   Deneise Lever, MD  levalbuterol Penne Lash) 0.63 MG/3ML nebulizer solution Take 3 mLs (0.63 mg total) by nebulization every 6 (six) hours as needed for wheezing or shortness of breath. 06/30/16   Deneise Lever, MD  predniSONE (DELTASONE) 10 MG tablet Take 10 mg by mouth 2 (two) times daily.    Historical Provider, MD  PROAIR HFA 108 (90 BASE) MCG/ACT inhaler INHALE 2 PUFFS BY MOUTH EVERY 6 HOURS AS NEEDED FOR WHEEZING OR SHORTNESS OF BREATH 11/15/14   Deneise Lever, MD  simvastatin (ZOCOR) 20 MG tablet Take 20 mg by mouth daily.     Historical Provider, MD    Physical Exam: Vitals:   01/17/2017 1245 01/17/2017 1500 12/24/2016 1506 12/24/2016 1600  BP: 100/71 (!) 109/96 (!) 109/96 120/73  Pulse: 67 90 92 (!) 102  Resp: (!) 29 (!) 26 (!) 24 (!) 23  SpO2: 97% 95% 96% 94%     General: Appears apprehensive in moderate respiratory distress with tachypnea Eyes: PERRLA, EOMI, normal lids, iris ENT:  grossly normal hearing, lips & tongue, mucous membranes moist and intact Neck: no lymphoadenopathy, masses or thyromegaly Cardiovascular: RRR with frequent ectopic beats, no m/r/g. No JVD, carotid bruits. No LE edema.  Respiratory: bilateral expiratory prolonged wheezes and rhonchi L>R. increased respiratory effort. No accessory abdominal muscle use observed Abdomen: soft, non-tender, non-distended, no  organomegaly or masses appreciated. BS present in all quadrants Skin: no rash, ulcers or induration seen on limited exam Musculoskeletal: grossly normal tone BUE/BLE, good ROM, no bony abnormality or joint deformities observed Psychiatric: grossly normal mood and affect, speech fluent and appropriate, alert and oriented x3 Neurologic: CN II-XII grossly intact, moves all extremities in coordinated fashion, sensation intact  Labs on Admission: I have personally reviewed following labs and imaging studies  CBC, BMP  GFR: Estimated Creatinine Clearance: 43.6 mL/min (by C-G formula based on SCr of 0.64 mg/dL).   Creatinine Clearance: Estimated Creatinine Clearance: 43.6 mL/min (by C-G formula based on SCr of 0.64 mg/dL).    Radiological Exams on Admission: Dg Chest 2 View  Result Date: 12/24/2016 CLINICAL DATA:  Shortness of breath, productive cough x3 days EXAM: CHEST  2 VIEW COMPARISON:  None. FINDINGS: Lungs are clear. Minimal blunting of the left costophrenic angle, favored to reflect mild scarring/pleural thickening. No pneumothorax. The heart is normal in size. Visualized osseous structures are within normal limits. IMPRESSION: No evidence of acute cardiopulmonary disease. Electronically Signed   By: Julian Hy M.D.   On: 01/07/2017 13:39    EKG: Independently reviewed - sinus rhythm with frequent PVCs acute changes otherwise Assessment/Plan Principal Problem:   Acute respiratory failure with hypoxia (HCC) Active Problems:   Essential hypertension   GERD   PAF (paroxysmal atrial fibrillation) (HCC)   Dyslipidemia   Acute exacerbation of chronic obstructive pulmonary disease (COPD) (HCC)   CHF (congestive heart failure) (HCC)    Acute respiratory failure with hypoxia associated with acute exacerbation of COPD Start antibiotic therapy with IV Rocephin and Zithromax. Guaifenesin scheduled Continue scheduled nebulizer therapy with Xopenex to avoid Continue IV  steroids Obtain respiratory viral panel and monitor leukocytosis  Hypertension - currently stable Continue home medication and adjust the doses if needed depending on the BP readings  PAF - continue Eliquis and rate controlled medications Continue to monitor on telemetry  CHF with grade 2 diastolic dysfunction - her BNP is slightly elevated (173.8),  but better than during the last visit to the ED (291) and troponin is normal. No signs of fluid volume overload observed.  Dyslipidemia - continue Zocor  GERD - continue PPI, currently patient is asymptomatic  Hypokalemia - replace and recheck Will check Magnesium level    DVT prophylaxis: Eliquis Code Status: full Family Communication: at bedside Disposition Plan: telemetry Consults called: none Admission status: observation   York Grice, [place] Pager: [phone] [facility]  If 7PM-7AM, please contact night-coverage [email or web address] Password [email or web address]  12/21/2016, 4:15 PM

## 2016-12-25 NOTE — ED Notes (Signed)
Patient transported to X-ray 

## 2016-12-25 NOTE — ED Triage Notes (Addendum)
Pt BIB EMS from her PCP office for SOB and sats 86% on RA. Pt states she has hx COPD and recovering from a flu like illness and "passing out" she had two weeks ago. Pt voice hoarse; speaking full sentences; accessory muscle use. Pt received 1 duoneb, 2 nebs, and 125 mg solumedrol prior to arrival. A&Ox4. EDP at bedside.

## 2016-12-26 DIAGNOSIS — J9622 Acute and chronic respiratory failure with hypercapnia: Secondary | ICD-10-CM | POA: Diagnosis not present

## 2016-12-26 DIAGNOSIS — R0602 Shortness of breath: Secondary | ICD-10-CM | POA: Diagnosis not present

## 2016-12-26 DIAGNOSIS — G931 Anoxic brain damage, not elsewhere classified: Secondary | ICD-10-CM | POA: Diagnosis not present

## 2016-12-26 DIAGNOSIS — T380X5A Adverse effect of glucocorticoids and synthetic analogues, initial encounter: Secondary | ICD-10-CM | POA: Diagnosis not present

## 2016-12-26 DIAGNOSIS — Z7952 Long term (current) use of systemic steroids: Secondary | ICD-10-CM | POA: Diagnosis not present

## 2016-12-26 DIAGNOSIS — J90 Pleural effusion, not elsewhere classified: Secondary | ICD-10-CM | POA: Diagnosis not present

## 2016-12-26 DIAGNOSIS — E872 Acidosis: Secondary | ICD-10-CM | POA: Diagnosis present

## 2016-12-26 DIAGNOSIS — J210 Acute bronchiolitis due to respiratory syncytial virus: Secondary | ICD-10-CM | POA: Diagnosis not present

## 2016-12-26 DIAGNOSIS — J9601 Acute respiratory failure with hypoxia: Secondary | ICD-10-CM | POA: Diagnosis not present

## 2016-12-26 DIAGNOSIS — Z7901 Long term (current) use of anticoagulants: Secondary | ICD-10-CM | POA: Diagnosis not present

## 2016-12-26 DIAGNOSIS — G629 Polyneuropathy, unspecified: Secondary | ICD-10-CM | POA: Diagnosis present

## 2016-12-26 DIAGNOSIS — G9340 Encephalopathy, unspecified: Secondary | ICD-10-CM | POA: Diagnosis not present

## 2016-12-26 DIAGNOSIS — R05 Cough: Secondary | ICD-10-CM | POA: Diagnosis not present

## 2016-12-26 DIAGNOSIS — I5020 Unspecified systolic (congestive) heart failure: Secondary | ICD-10-CM | POA: Diagnosis not present

## 2016-12-26 DIAGNOSIS — I10 Essential (primary) hypertension: Secondary | ICD-10-CM | POA: Diagnosis not present

## 2016-12-26 DIAGNOSIS — R0902 Hypoxemia: Secondary | ICD-10-CM | POA: Diagnosis not present

## 2016-12-26 DIAGNOSIS — J9621 Acute and chronic respiratory failure with hypoxia: Secondary | ICD-10-CM | POA: Diagnosis not present

## 2016-12-26 DIAGNOSIS — E43 Unspecified severe protein-calorie malnutrition: Secondary | ICD-10-CM | POA: Diagnosis present

## 2016-12-26 DIAGNOSIS — F419 Anxiety disorder, unspecified: Secondary | ICD-10-CM | POA: Diagnosis present

## 2016-12-26 DIAGNOSIS — B974 Respiratory syncytial virus as the cause of diseases classified elsewhere: Secondary | ICD-10-CM | POA: Diagnosis not present

## 2016-12-26 DIAGNOSIS — J441 Chronic obstructive pulmonary disease with (acute) exacerbation: Secondary | ICD-10-CM | POA: Diagnosis not present

## 2016-12-26 DIAGNOSIS — E785 Hyperlipidemia, unspecified: Secondary | ICD-10-CM | POA: Diagnosis not present

## 2016-12-26 DIAGNOSIS — G35 Multiple sclerosis: Secondary | ICD-10-CM | POA: Diagnosis present

## 2016-12-26 DIAGNOSIS — E876 Hypokalemia: Secondary | ICD-10-CM | POA: Diagnosis present

## 2016-12-26 DIAGNOSIS — I5032 Chronic diastolic (congestive) heart failure: Secondary | ICD-10-CM | POA: Diagnosis present

## 2016-12-26 DIAGNOSIS — Z515 Encounter for palliative care: Secondary | ICD-10-CM | POA: Diagnosis not present

## 2016-12-26 DIAGNOSIS — I110 Hypertensive heart disease with heart failure: Secondary | ICD-10-CM | POA: Diagnosis present

## 2016-12-26 DIAGNOSIS — M069 Rheumatoid arthritis, unspecified: Secondary | ICD-10-CM | POA: Diagnosis present

## 2016-12-26 DIAGNOSIS — Z9981 Dependence on supplemental oxygen: Secondary | ICD-10-CM | POA: Diagnosis not present

## 2016-12-26 DIAGNOSIS — I350 Nonrheumatic aortic (valve) stenosis: Secondary | ICD-10-CM | POA: Diagnosis present

## 2016-12-26 DIAGNOSIS — K219 Gastro-esophageal reflux disease without esophagitis: Secondary | ICD-10-CM | POA: Diagnosis not present

## 2016-12-26 DIAGNOSIS — I480 Paroxysmal atrial fibrillation: Secondary | ICD-10-CM | POA: Diagnosis not present

## 2016-12-26 DIAGNOSIS — Z87891 Personal history of nicotine dependence: Secondary | ICD-10-CM | POA: Diagnosis not present

## 2016-12-26 DIAGNOSIS — Z4682 Encounter for fitting and adjustment of non-vascular catheter: Secondary | ICD-10-CM | POA: Diagnosis not present

## 2016-12-26 LAB — CBC WITH DIFFERENTIAL/PLATELET
BASOS ABS: 0 10*3/uL (ref 0.0–0.1)
Basophils Relative: 0 %
Eosinophils Absolute: 0 10*3/uL (ref 0.0–0.7)
Eosinophils Relative: 0 %
HEMATOCRIT: 39.9 % (ref 36.0–46.0)
HEMOGLOBIN: 12.8 g/dL (ref 12.0–15.0)
LYMPHS ABS: 0.4 10*3/uL — AB (ref 0.7–4.0)
LYMPHS PCT: 4 %
MCH: 29.1 pg (ref 26.0–34.0)
MCHC: 32.1 g/dL (ref 30.0–36.0)
MCV: 90.7 fL (ref 78.0–100.0)
Monocytes Absolute: 0.1 10*3/uL (ref 0.1–1.0)
Monocytes Relative: 2 %
NEUTROS ABS: 8.7 10*3/uL — AB (ref 1.7–7.7)
NEUTROS PCT: 94 %
PLATELETS: 217 10*3/uL (ref 150–400)
RBC: 4.4 MIL/uL (ref 3.87–5.11)
RDW: 15.3 % (ref 11.5–15.5)
WBC: 9.2 10*3/uL (ref 4.0–10.5)

## 2016-12-26 LAB — BASIC METABOLIC PANEL
Anion gap: 11 (ref 5–15)
BUN: 18 mg/dL (ref 6–20)
CALCIUM: 8.7 mg/dL — AB (ref 8.9–10.3)
CHLORIDE: 94 mmol/L — AB (ref 101–111)
CO2: 33 mmol/L — AB (ref 22–32)
Creatinine, Ser: 0.68 mg/dL (ref 0.44–1.00)
GFR calc Af Amer: >60 mL/min (ref >60)
GLUCOSE: 254 mg/dL — AB (ref 65–99)
POTASSIUM: 4 mmol/L (ref 3.5–5.1)
Sodium: 138 mmol/L (ref 135–145)

## 2016-12-26 MED ORDER — GLUCERNA SHAKE PO LIQD
237.0000 mL | Freq: Two times a day (BID) | ORAL | Status: DC
  Administered 2016-12-26 – 2016-12-27 (×3): 237 mL via ORAL

## 2016-12-26 MED ORDER — GUAIFENESIN-DM 100-10 MG/5ML PO SYRP
5.0000 mL | ORAL_SOLUTION | ORAL | Status: DC | PRN
  Administered 2016-12-26 – 2016-12-27 (×2): 5 mL via ORAL
  Filled 2016-12-26 (×2): qty 5

## 2016-12-26 MED ORDER — DEXTROSE 5 % IV SOLN
500.0000 mg | INTRAVENOUS | Status: AC
  Administered 2016-12-26 – 2016-12-29 (×4): 500 mg via INTRAVENOUS
  Filled 2016-12-26 (×6): qty 500

## 2016-12-26 MED ORDER — APIXABAN 2.5 MG PO TABS
2.5000 mg | ORAL_TABLET | Freq: Two times a day (BID) | ORAL | Status: DC
  Administered 2016-12-26 – 2016-12-27 (×3): 2.5 mg via ORAL
  Filled 2016-12-26 (×3): qty 1

## 2016-12-26 MED ORDER — LEVALBUTEROL HCL 0.63 MG/3ML IN NEBU
0.6300 mg | INHALATION_SOLUTION | Freq: Four times a day (QID) | RESPIRATORY_TRACT | Status: DC
  Administered 2016-12-27 – 2016-12-28 (×8): 0.63 mg via RESPIRATORY_TRACT
  Filled 2016-12-26 (×8): qty 3

## 2016-12-26 MED ORDER — BENZONATATE 100 MG PO CAPS
200.0000 mg | ORAL_CAPSULE | Freq: Three times a day (TID) | ORAL | Status: DC | PRN
  Administered 2016-12-26 – 2016-12-27 (×4): 200 mg via ORAL
  Filled 2016-12-26 (×5): qty 2

## 2016-12-26 MED ORDER — METHYLPREDNISOLONE SODIUM SUCC 125 MG IJ SOLR
60.0000 mg | Freq: Four times a day (QID) | INTRAMUSCULAR | Status: DC
  Administered 2016-12-26 – 2016-12-28 (×10): 60 mg via INTRAVENOUS
  Filled 2016-12-26 (×10): qty 2

## 2016-12-26 MED ORDER — IPRATROPIUM BROMIDE 0.02 % IN SOLN
0.5000 mg | Freq: Four times a day (QID) | RESPIRATORY_TRACT | Status: DC
  Administered 2016-12-27 – 2016-12-28 (×7): 0.5 mg via RESPIRATORY_TRACT
  Filled 2016-12-26 (×8): qty 2.5

## 2016-12-26 NOTE — Progress Notes (Signed)
PROGRESS NOTE    [name]  ZOX:[id number] DOB: 23-Jul-1933 DOA: 12/22/2016 PCP: Lillard Anes, MD    Brief Narrative: 83 y.o.femalewith a history ofCHF, PAF, and COPD who presents the emergency department from her primary care physician's office for shortness of breath. She was admitted for further evaluation.   Assessment & Plan:   Principal Problem:   Acute respiratory failure with hypoxia (HCC) Active Problems:   Essential hypertension   GERD   PAF (paroxysmal atrial fibrillation) (HCC)   Dyslipidemia   Acute exacerbation of chronic obstructive pulmonary disease (COPD) (HCC)   CHF (congestive heart failure) (HCC)   Acute respiratory failure with hypoxia secondary to acute COPD exacerbation and acute on chronic bronchitis.  Admitted for IV steroids, bronchodilators, duonebs. Was started on IV solumedrol 80 mg every 6 hours, tapered down to 60 mg every 6 hours.  Added robitussin and tessalon.  [place] canula oxygen to keep sats greater than 90%.  Ambulate and check oxygen levels.  Antibiotics for bronchitis.  Sputum cultures will be ordered.    Paroxysmal atrial fibrillation: rate controlled on eliquis, decreased the dose to 2.5 mg bid.    Hypertension: well controlled.    GERD: stable.    DVT prophylaxis: eliquis Code Status: full code.  Family Communication: none at base.s  Disposition Plan: home when clinically better.    Consultants:   None.    Procedures: none.    Antimicrobials: rocephin and zithromax on 4/6   Subjective: Coughing.   Objective: Vitals:   12/26/16 0941 12/26/16 1129 12/26/16 1132 12/26/16 1200  BP: 115/88   130/61  Pulse: 81   85  Resp: 18   18  Temp:    98.7 F (37.1 C)  TempSrc:    Oral  SpO2: 98% 92% 93% 100%  Weight:      Height:        Intake/Output Summary (Last 24 hours) at 12/26/16 1404 Last data filed at 12/26/16 0945  Gross per 24 hour  Intake              773 ml  Output              250 ml  Net               523 ml   Filed Weights   12/20/2016 1650 12/26/16 0641  Weight: 53.1 kg (117 lb) 52.8 kg (116 lb 4.8 oz)    Examination:  General exam: Appears calm and comfortable on 2 lit of [place] oxygen.  Respiratory system: bilateral wheezing heard. Diminished at bases.  Cardiovascular system: S1 & S2 heard, RRR. No JVD, murmurs, rubs, gallops or clicks. No pedal edema. Gastrointestinal system: Abdomen is nondistended, soft and nontender. No organomegaly or masses felt. Normal bowel sounds heard. Central nervous system: Alert and oriented. No focal neurological deficits. Extremities: Symmetric 5 x 5 power. Skin: No rashes, lesions or ulcers Psychiatry: Judgement and insight appear normal. Mood & affect appropriate.     Data Reviewed: I have personally reviewed following labs and imaging studies  CBC:  Recent Labs Lab 12/24/2016 1232 12/26/16 0254  WBC 14.1* 9.2  NEUTROABS 10.4* 8.7*  HGB 14.0 12.8  HCT 43.3 39.9  MCV 92.1 90.7  PLT 229 811   Basic Metabolic Panel:  Recent Labs Lab 12/31/2016 1232 12/27/2016 1633 12/26/16 0254  NA 138  --  138  K 3.4*  --  4.0  CL 94*  --  94*  CO2 34*  --  33*  GLUCOSE 90  --  254*  BUN 13  --  18  CREATININE 0.64  --  0.68  CALCIUM 9.0  --  8.7*  MG  --  1.8  --    GFR: Estimated Creatinine Clearance: 43.6 mL/min (by C-G formula based on SCr of 0.68 mg/dL). Liver Function Tests: No results for input(s): AST, ALT, ALKPHOS, BILITOT, PROT, ALBUMIN in the last 168 hours. No results for input(s): LIPASE, AMYLASE in the last 168 hours. No results for input(s): AMMONIA in the last 168 hours. Coagulation Profile: No results for input(s): INR, PROTIME in the last 168 hours. Cardiac Enzymes: No results for input(s): CKTOTAL, CKMB, CKMBINDEX, TROPONINI in the last 168 hours. BNP (last 3 results) No results for input(s): PROBNP in the last 8760 hours. HbA1C: No results for input(s): HGBA1C in the last 72 hours. CBG: No results for  input(s): GLUCAP in the last 168 hours. Lipid Profile: No results for input(s): CHOL, HDL, LDLCALC, TRIG, CHOLHDL, LDLDIRECT in the last 72 hours. Thyroid Function Tests: No results for input(s): TSH, T4TOTAL, FREET4, T3FREE, THYROIDAB in the last 72 hours. Anemia Panel: No results for input(s): VITAMINB12, FOLATE, FERRITIN, TIBC, IRON, RETICCTPCT in the last 72 hours. Sepsis Labs: No results for input(s): PROCALCITON, LATICACIDVEN in the last 168 hours.  Recent Results (from the past 240 hour(s))  Respiratory Panel by PCR     Status: Abnormal   Collection Time: 12/28/2016  4:36 PM  Result Value Ref Range Status   Adenovirus NOT DETECTED NOT DETECTED Final   Coronavirus 229E NOT DETECTED NOT DETECTED Final   Coronavirus HKU1 NOT DETECTED NOT DETECTED Final   Coronavirus NL63 NOT DETECTED NOT DETECTED Final   Coronavirus OC43 NOT DETECTED NOT DETECTED Final   Metapneumovirus NOT DETECTED NOT DETECTED Final   Rhinovirus / Enterovirus NOT DETECTED NOT DETECTED Final   Influenza A NOT DETECTED NOT DETECTED Final   Influenza B NOT DETECTED NOT DETECTED Final   Parainfluenza Virus 1 NOT DETECTED NOT DETECTED Final   Parainfluenza Virus 2 NOT DETECTED NOT DETECTED Final   Parainfluenza Virus 3 NOT DETECTED NOT DETECTED Final   Parainfluenza Virus 4 NOT DETECTED NOT DETECTED Final   Respiratory Syncytial Virus DETECTED (A) NOT DETECTED Final    Comment: CRITICAL RESULT CALLED TO, READ BACK BY AND VERIFIED WITH: Starlyn Skeans, RN, 01/09/2017 AT 2034 BY J FUDESCO    Bordetella pertussis NOT DETECTED NOT DETECTED Final   Chlamydophila pneumoniae NOT DETECTED NOT DETECTED Final   Mycoplasma pneumoniae NOT DETECTED NOT DETECTED Final  MRSA PCR Screening     Status: Abnormal   Collection Time: 12/26/2016  4:59 PM  Result Value Ref Range Status   MRSA by PCR POSITIVE (A) NEGATIVE Final    Comment:        The GeneXpert MRSA Assay (FDA approved for NASAL specimens only), is one component of  a comprehensive MRSA colonization surveillance program. It is not intended to diagnose MRSA infection nor to guide or monitor treatment for MRSA infections. RESULT CALLED TO, READ BACK BY AND VERIFIED WITHStarlyn Skeans RN 2016 12/30/2016 A BROWNING          Radiology Studies: Dg Chest 2 View  Result Date: 12/31/2016 CLINICAL DATA:  Shortness of breath, productive cough x3 days EXAM: CHEST  2 VIEW COMPARISON:  None. FINDINGS: Lungs are clear. Minimal blunting of the left costophrenic angle, favored to reflect mild scarring/pleural thickening. No pneumothorax. The heart is normal in size. Visualized osseous structures  are within normal limits. IMPRESSION: No evidence of acute cardiopulmonary disease. Electronically Signed   By: Julian Hy M.D.   On: 12/30/2016 13:39        Scheduled Meds: . ALPRAZolam  0.25 mg Oral Daily  . apixaban  5 mg Oral BID  . atenolol  50 mg Oral Daily  . azithromycin  500 mg Intravenous Q24H  . cefTRIAXone (ROCEPHIN)  IV  1 g Intravenous Q24H  . Chlorhexidine Gluconate Cloth  6 each Topical Q0600  . gabapentin  300 mg Oral QID  . ipratropium  0.5 mg Nebulization Q4H  . levalbuterol  0.63 mg Nebulization Q4H  . methylPREDNISolone (SOLU-MEDROL) injection  80 mg Intravenous Q6H  . mupirocin ointment  1 application Nasal BID  . pantoprazole  40 mg Oral Daily  . simvastatin  20 mg Oral Daily  . sodium chloride flush  3 mL Intravenous Q12H   Continuous Infusions:   LOS: 0 days    Time spent: 35 minutes.     Hosie Poisson, MD [facility] Pager [phone]  If 7PM-7AM, please contact night-coverage [email or web address] Password [email or web address] 12/26/2016, 2:04 PM

## 2016-12-26 NOTE — Progress Notes (Signed)
Initial Nutrition Assessment  DOCUMENTATION CODES:  Severe malnutrition in context of acute illness/injury   Pt meets criteria for SEVERE MALNUTRITION in the context of Acute illness as evidenced by Loss of 3% bw in 2 weeks and moderate muscle wasting -admittedly her wasting may be more due to chronic process.  INTERVENTION:  Glucerna Shake po TID, each supplement provides 220 kcal and 10 grams of protein  Diet recommendations made on weight maintenance.   Admits to liberal use of salt shaker-recommended alternatives  NUTRITION DIAGNOSIS:  Increased nutrient needs related to chronic illness (COPD), acute illness (resp failure, bronchitis) as evidenced by loss of 4 lbs (3% bw ) in 2 weeks and loss of 15 lbs (11% bw) x 1 year.   GOAL:  Patient will meet greater than or equal to 90% of their needs  MONITOR:  PO intake, Supplement acceptance, Labs, Weight trends  REASON FOR ASSESSMENT:  Consult COPD Protocol  ASSESSMENT:  83 y/o female PMhx CHF, COPD-steroid dependent and requires 02 use-, PAF, GERD, MS, HTN, Anxiety, RA. Presented to ED via EMS w/ SOB and wheezing. Admitted for acute resp failure w/ hypoxia w/ acute COPD exacerbation  Pt says that she thinks she has a good appetite and is surprised to see the dietitian. RD beings up her weight loss. She says she is not sure why she has lost weight. 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 SHe apparently is inferring her meals are veyr high in kcals/fat etc.  She says she eats 3 times a day "off and on". She does not eat any snacks. She does not take any vitamin or mineral supplements "that she knows of". She admites to using a large amount of salt; "I love salt". She asks if eating more will help her lose weight.   She reports an extremely active lifestyle, mostly in part to her taking care of her sick husband. She sounds to do most everything for him.   RD stated that he believed that her PO intake may not be meeting her  increased needs, secondary to her chronic illnesses. Also, her appetite may have decreased due to her age and she may not have noticed.   RD highly recommended that she start consuming snacks or a daily supplement, such as ensure/boost, to help prevent further wt loss. RD also stated that eating too much sodium can increase swelling, blood pressure and worsen her respiratory status. Recommended pepper, garlic powder, onion powder  At this time, will try a supplement. Given high BG, will order Glucerna BID.   Physical exam- mild fat loss, moderate muscle loss-almost exclusively to lower body  Labs: Recent glu 254 Medications: IV abx, ppi, methyprednisolone   Recent Labs Lab 12/21/2016 1232 12/26/2016 1633 12/26/16 0254  NA 138  --  138  K 3.4*  --  4.0  CL 94*  --  94*  CO2 34*  --  33*  BUN 13  --  18  CREATININE 0.64  --  0.68  CALCIUM 9.0  --  8.7*  MG  --  1.8  --   GLUCOSE 90  --  254*   Diet Order:  Diet Heart Room service appropriate? Yes; Fluid consistency: Thin  Skin:  Reviewed, no issues  Last BM:  Unknown  Height:  Ht Readings from Last 1 Encounters:  01/03/2017 5\' 2"  (1.575 m)   Weight:  Wt Readings from Last 1 Encounters:  12/26/16 116 lb 4.8 oz (52.8 kg)  Wt Readings from Last 10 Encounters:  12/26/16 116 lb 4.8 oz (52.8 kg)  12/23/16 121 lb 6.4 oz (55.1 kg)  12/15/16 120 lb (54.4 kg)  09/04/16 122 lb 3.2 oz (55.4 kg)  04/24/16 125 lb 9.6 oz (57 kg)  02/21/16 131 lb (59.4 kg)  12/16/15 133 lb (60.3 kg)  11/13/15 132 lb (59.9 kg)  08/16/15 136 lb 3.2 oz (61.8 kg)  04/15/15 131 lb (59.4 kg)   Ideal Body Weight:  50 kg  BMI:  Body mass index is 21.27 kg/m.  Estimated Nutritional Needs:  Kcal:  1600-1800 (30-34 kcal/kg bw) Protein:  70-80 g Pro (1.3-1.5 g/kg bw) Fluid:  >1.3 L (25 ml/kg bw)  EDUCATION NEEDS:  Education needs no appropriate at this time  Burtis Junes RD, LDN, [place] Clinical Nutrition Pager: [phone] 12/26/2016 3:47 PM

## 2016-12-26 NOTE — Evaluation (Signed)
Physical Therapy Evaluation Patient Details Name: [name] MRN: [id number] DOB: 1932/11/15 Today's Date: 12/26/2016   History of Present Illness  Pt is an 83 y/o female admitted from PCP office secondary to SOB, acute respiratory failure secondary to a COPD exacerbation. PMH including but not limited to COPD, PAF, HTN and HLD.  Clinical Impression  Pt presented supine in bed with HOB elevated, awake and willing to participate in therapy session. Prior to admission, pt reported that she was independent with all functional mobility and is the primary caregiver for her husband who has a hx of CVA. Pt stated that her granddaughter lives with them and is able to assist as well. Pt ambulated within her room on RA with SPO2 maintaining >90% throughout. PT will continue to follow acutely to ensure a safe d/c home.    Follow Up Recommendations No PT follow up    Equipment Recommendations  None recommended by PT    Recommendations for Other Services       Precautions / Restrictions Restrictions Weight Bearing Restrictions: No      Mobility  Bed Mobility Overal bed mobility: Modified Independent                Transfers Overall transfer level: Needs assistance Equipment used: None Transfers: Sit to/from Stand Sit to Stand: Min guard         General transfer comment: increased time, min guard for safety  Ambulation/Gait Ambulation/Gait assistance: Min guard Ambulation Distance (Feet): 25 Feet Assistive device: None Gait Pattern/deviations: Step-through pattern;Decreased stride length Gait velocity: decreased Gait velocity interpretation: Below normal speed for age/gender General Gait Details: mildly unsteady but no LOB or need for physical assistance, min guard for safety  Stairs            Wheelchair Mobility    Modified Rankin (Stroke Patients Only)       Balance Overall balance assessment: Needs assistance Sitting-balance support: Feet  supported Sitting balance-Leahy Scale: Good     Standing balance support: During functional activity;No upper extremity supported Standing balance-Leahy Scale: Fair                               Pertinent Vitals/Pain Pain Assessment: No/denies pain    Home Living Family/patient expects to be discharged to:: Private residence Living Arrangements: Spouse/significant other;Other relatives Available Help at Discharge: Family;Available 24 hours/day Type of Home: House Home Access: Level entry     Home Layout: One level Home Equipment: None;Other (comment) (pt's husband has DME that he uses (hx of CVA)) Additional Comments: pt is primary caregiver for her husband    Prior Function Level of Independence: Independent               Hand Dominance        Extremity/Trunk Assessment   Upper Extremity Assessment Upper Extremity Assessment: Overall WFL for tasks assessed    Lower Extremity Assessment Lower Extremity Assessment: Overall WFL for tasks assessed    Cervical / Trunk Assessment Cervical / Trunk Assessment: Normal  Communication   Communication: No difficulties  Cognition Arousal/Alertness: Awake/alert Behavior During Therapy: WFL for tasks assessed/performed Overall Cognitive Status: Within Functional Limits for tasks assessed                                        General Comments  Exercises     Assessment/Plan    PT Assessment Patient needs continued PT services  PT Problem List Decreased balance;Decreased mobility;Decreased coordination;Decreased knowledge of use of DME;Decreased safety awareness;Cardiopulmonary status limiting activity       PT Treatment Interventions DME instruction;Gait training;Stair training;Functional mobility training;Therapeutic activities;Therapeutic exercise;Balance training;Neuromuscular re-education;Patient/family education    PT Goals (Current goals can be found in the Care Plan  section)  Acute Rehab PT Goals Patient Stated Goal: return home PT Goal Formulation: With patient Time For Goal Achievement: 01/09/17 Potential to Achieve Goals: Good    Frequency Min 3X/week   Barriers to discharge        Co-evaluation               End of Session   Activity Tolerance: Patient tolerated treatment well Patient left: in bed;with call bell/phone within reach;Other (comment) (sitting EOB) Nurse Communication: Mobility status PT Visit Diagnosis: Unsteadiness on feet (R26.81)    Time: 7867-5449 PT Time Calculation (min) (ACUTE ONLY): 16 min   Charges:   PT Evaluation $PT Eval Low Complexity: 1 Procedure     PT G Codes:   PT G-Codes **NOT FOR INPATIENT CLASS** Functional Assessment Tool Used: AM-PAC 6 Clicks Basic Mobility;Clinical judgement Functional Limitation: Mobility: Walking and moving around;Changing and maintaining body position Mobility: Walking and Moving Around Current Status [phone]): At least 1 percent but less than 20 percent impaired, limited or restricted Mobility: Walking and Moving Around Goal Status [phone]): 0 percent impaired, limited or restricted Changing and Maintaining Body Position Current Status (X5883): At least 1 percent but less than 20 percent impaired, limited or restricted Changing and Maintaining Body Position Goal Status (G5498): 0 percent impaired, limited or restricted    [facility], [place], DPT [place] 12/26/2016, 1:04 PM

## 2016-12-27 DIAGNOSIS — E43 Unspecified severe protein-calorie malnutrition: Secondary | ICD-10-CM | POA: Insufficient documentation

## 2016-12-27 MED ORDER — FUROSEMIDE 10 MG/ML IJ SOLN
40.0000 mg | Freq: Once | INTRAMUSCULAR | Status: AC
  Administered 2016-12-27: 40 mg via INTRAVENOUS
  Filled 2016-12-27: qty 4

## 2016-12-27 NOTE — Progress Notes (Signed)
Pharmacy Antibiotic Note  [name] is a 83 y.o. female admitted on 01/17/2017 with COPD exacerbation.  Also on azithromycin per MD. Pharmacy has been consulted for ceftriaxone dosing. Afebrile, WBC normal.   Plan: Ceftriaxone 1g IV q24h Azithromycin per MD  F/u clinical progress, LOT  Pharmacy to sign-off as CTX does not require renal adjustment.   Height: 5\' 2"  (157.5 cm) Weight: 117 lb 1.6 oz (53.1 kg) (a scale) IBW/kg (Calculated) : 50.1  Temp (24hrs), Avg:98.7 F (37.1 C), Min:98.2 F (36.8 C), Max:99.3 F (37.4 C)   Recent Labs Lab 01/09/2017 1232 12/26/16 0254  WBC 14.1* 9.2  CREATININE 0.64 0.68    Estimated Creatinine Clearance: 43.6 mL/min (by C-G formula based on SCr of 0.68 mg/dL).    Allergies  Allergen Reactions  . Amoxicillin-Pot Clavulanate Other (See Comments)    GI upset  . Daliresp [Roflumilast] Other (See Comments)    unknown  . Diltiazem Nausea Only  . Montelukast Sodium Other (See Comments)     flu-like symptoms  . Zafirlukast Other (See Comments)    unknown  . Adhesive [Tape] Rash    Please use paper tape    Argie Ramming, PharmD Pharmacy Resident  Pager [phone] 12/27/16 1:28 PM

## 2016-12-27 NOTE — Progress Notes (Signed)
Lasix 40mg  IV given per Lamar Blinks NP.

## 2016-12-27 NOTE — Progress Notes (Signed)
Pt verbalized breathing better, oxygen down to 5L [place] sat at 100%. Will continue to monitor.

## 2016-12-27 NOTE — Significant Event (Addendum)
Rapid Response Event Note  Overview:Called to room by bedside RN d/t pts increased WOB and sats-80s. Time Called: 2240 Arrival Time: 2243 Event Type: Respiratory  Initial Focused Assessment: Pt alert and oriented. Bed at 45 degrees. Pt having increased RR-using accessory muscles to breath-lungs with exp wheezing and crackles t/o, skin warm and dry. VS: T-97.6, HR-119, BP-139/67, RR 30s, sats-100% on PRB.  Interventions: Pt given breathing txt and placed on PRB PTA RRT.  Pt states she feels like she is breathing better and bedside RN says she looks better than she did prior to the breathing txt.  Pt FiO2 titrated down to 6L [place] with sats-100%. Schorr, NP notified of situation and ordered 40mg  IV Lasix X 1.    Plan of Care (if not transferred): IV Lasix, continuously monitor sats and WOB.  Call RRT if assistance needed.  Event Summary: Name of Physician Notified: Schorr, NP at 2255    at    Outcome: Stayed in room and stabalized  Event End Time: 2305  Dillard [place]

## 2016-12-27 NOTE — Discharge Instructions (Signed)

## 2016-12-27 NOTE — Progress Notes (Signed)
[facility] Progress Note  Patient: [name] NWG:[id number]   PCP: Lillard Anes, MD DOB: 05-15-1933   DOA: 01/13/2017   DOS: 12/27/2016   Date of Service: the patient was seen and examined on 12/27/2016  Subjective: Continues to have shortness of breath as well as wheezing. No chest pain has significant tachypnea. Still talking in short sentences.  Brief hospital course: Pt. with PMH of steroid dependent COPD, nocturnal O2, A. fib on Eliquis, aortic stenosis, anxiety, GERD, MS, RA; admitted on 12/27/2016, with complaint of shortness of breath and cough, was found to have RSV bronchiolitis. Currently further plan is supportive treatment.  Assessment and Plan: 1. Acute respiratory failure with hypoxia (HCC) RSV bronchiolitis. Continue steroids, continue duo nebs, continue medical antibiotics for secondary infection. Attempt to wean the oxygen. Mucinex and flutter device. spirometry is also provided to the patient earlier.  2. A. fib. Continuing Eliquis. Currently rate controlled at present. On Xopenex.  3. Essential hypertension. Continuing atenolol at present. Holding HCTZ  4. GERD. Continue PPI.  5. Neuropathy. On gabapentin 300 mg 4 times a day at home. Currently continuing.  6. Chronic diastolic dysfunction. No evidence of volume overload at present we'll continue to monitor. Daily weight and ins and outs.  Bowel regimen: last BM 12/26/2016 Diet: cardiac diet DVT Prophylaxis:on therapeutic anticoagulation.  Advance goals of care discussion: full code  Family Communication: no family was present at bedside, at the time of interview.  Disposition:  Discharge to home. Expected discharge date: 12/28/2016,  Consultants: none Procedures: none  Antibiotics: Anti-infectives    Start     Dose/Rate Route Frequency Ordered Stop   12/26/16 1830  azithromycin (ZITHROMAX) 500 mg in dextrose 5 % 250 mL IVPB  Status:  Discontinued     500 mg 250 mL/hr over 60  Minutes Intravenous Every 24 hours 01/04/2017 1803 12/26/16 1124   12/26/16 1230  azithromycin (ZITHROMAX) 500 mg in dextrose 5 % 250 mL IVPB     500 mg 250 mL/hr over 60 Minutes Intravenous Every 24 hours 12/26/16 1124     12/27/2016 1830  cefTRIAXone (ROCEPHIN) 1 g in dextrose 5 % 50 mL IVPB     1 g 100 mL/hr over 30 Minutes Intravenous Every 24 hours 12/21/2016 1803     12/22/2016 1800  azithromycin (ZITHROMAX) 500 mg in dextrose 5 % 250 mL IVPB  Status:  Discontinued     500 mg 250 mL/hr over 60 Minutes Intravenous Every 24 hours 01/06/2017 1639 01/09/2017 1803   01/14/2017 1645  cefTRIAXone (ROCEPHIN) 1 g in dextrose 5 % 50 mL IVPB  Status:  Discontinued     1 g 100 mL/hr over 30 Minutes Intravenous Every 24 hours 01/09/2017 1642 12/30/2016 1803       Objective: Physical Exam: Vitals:   12/27/16 1243 12/27/16 1245 12/27/16 1621 12/27/16 1626  BP:      Pulse:      Resp:      Temp:      TempSrc:      SpO2: 99% 98% 100% 97%  Weight:      Height:        Intake/Output Summary (Last 24 hours) at 12/27/16 1658 Last data filed at 12/27/16 1300  Gross per 24 hour  Intake              723 ml  Output             1900 ml  Net            -  1177 ml   Filed Weights   12/29/2016 1650 12/26/16 0641 12/27/16 0546  Weight: 53.1 kg (117 lb) 52.8 kg (116 lb 4.8 oz) 53.1 kg (117 lb 1.6 oz)   General: Alert, Awake and Oriented to Time, Place and Person. Appear in moderate distress, affect appropriate Eyes: PERRL, Conjunctiva normal ENT: Oral Mucosa clear moist. Neck: no JVD, no Abnormal Mass Or lumps Cardiovascular: S1 and S2 Present, aortic systolic Murmur, Respiratory: Bilateral Air entry equal and Decreased, no use of accessory muscle, bilateral Crackles, bilateral wheezes Abdomen: Bowel Sound present, Soft and no tenderness Skin: no redness, no Rash, no induration Extremities: no Pedal edema, no calf tenderness Neurologic: Grossly no focal neuro deficit. Bilaterally Equal motor strength  Data  Reviewed: CBC:  Recent Labs Lab 01/16/2017 1232 12/26/16 0254  WBC 14.1* 9.2  NEUTROABS 10.4* 8.7*  HGB 14.0 12.8  HCT 43.3 39.9  MCV 92.1 90.7  PLT 229 539   Basic Metabolic Panel:  Recent Labs Lab 12/29/2016 1232 12/23/2016 1633 12/26/16 0254  NA 138  --  138  K 3.4*  --  4.0  CL 94*  --  94*  CO2 34*  --  33*  GLUCOSE 90  --  254*  BUN 13  --  18  CREATININE 0.64  --  0.68  CALCIUM 9.0  --  8.7*  MG  --  1.8  --     Liver Function Tests: No results for input(s): AST, ALT, ALKPHOS, BILITOT, PROT, ALBUMIN in the last 168 hours. No results for input(s): LIPASE, AMYLASE in the last 168 hours. No results for input(s): AMMONIA in the last 168 hours. Coagulation Profile: No results for input(s): INR, PROTIME in the last 168 hours. Cardiac Enzymes: No results for input(s): CKTOTAL, CKMB, CKMBINDEX, TROPONINI in the last 168 hours. BNP (last 3 results) No results for input(s): PROBNP in the last 8760 hours. CBG: No results for input(s): GLUCAP in the last 168 hours. Studies: No results found.  Scheduled Meds: . ALPRAZolam  0.25 mg Oral Daily  . apixaban  2.5 mg Oral BID  . atenolol  50 mg Oral Daily  . azithromycin  500 mg Intravenous Q24H  . cefTRIAXone (ROCEPHIN)  IV  1 g Intravenous Q24H  . Chlorhexidine Gluconate Cloth  6 each Topical Q0600  . feeding supplement (GLUCERNA SHAKE)  237 mL Oral BID BM  . gabapentin  300 mg Oral QID  . ipratropium  0.5 mg Nebulization QID  . levalbuterol  0.63 mg Nebulization QID  . methylPREDNISolone (SOLU-MEDROL) injection  60 mg Intravenous Q6H  . mupirocin ointment  1 application Nasal BID  . pantoprazole  40 mg Oral Daily  . simvastatin  20 mg Oral Daily  . sodium chloride flush  3 mL Intravenous Q12H   Continuous Infusions: PRN Meds: acetaminophen **OR** acetaminophen, benzonatate, guaiFENesin-dextromethorphan, HYDROcodone-acetaminophen, ipratropium, ondansetron **OR** ondansetron (ZOFRAN) IV, polyethylene glycol,  traZODone  Time spent: 30 minutes  Author: Berle Mull, MD [facility] Pager: [phone] 12/27/2016 4:58 PM  If 7PM-7AM, please contact night-coverage at [email or web address], password [facility]

## 2016-12-27 NOTE — Progress Notes (Signed)
Pt c/o shortness of breath, pt having inspiratory wheezes and crackles, Oxygen sat 93 on 3L [place], prn atrovent neb given, pt still c/o shortness of breath after the treatment, Oxygen sat down to 80's at 4l [place]. Pt placed on non rebreather , Oxygen sat at 100%  rapid response notified and came to assessed the patient. Lamar Blinks  NP notified, awaiting call back at this time.

## 2016-12-28 ENCOUNTER — Inpatient Hospital Stay (HOSPITAL_COMMUNITY): Payer: Medicare Other

## 2016-12-28 ENCOUNTER — Encounter (HOSPITAL_COMMUNITY): Payer: Self-pay | Admitting: *Deleted

## 2016-12-28 DIAGNOSIS — K219 Gastro-esophageal reflux disease without esophagitis: Secondary | ICD-10-CM

## 2016-12-28 DIAGNOSIS — R0902 Hypoxemia: Secondary | ICD-10-CM

## 2016-12-28 DIAGNOSIS — E785 Hyperlipidemia, unspecified: Secondary | ICD-10-CM

## 2016-12-28 DIAGNOSIS — I10 Essential (primary) hypertension: Secondary | ICD-10-CM

## 2016-12-28 DIAGNOSIS — I480 Paroxysmal atrial fibrillation: Secondary | ICD-10-CM

## 2016-12-28 LAB — BLOOD GAS, ARTERIAL
Acid-Base Excess: 9.9 mmol/L — ABNORMAL HIGH (ref 0.0–2.0)
Bicarbonate: 36.7 mmol/L — ABNORMAL HIGH (ref 20.0–28.0)
Drawn by: 418751
FIO2: 35
O2 Saturation: 94.5 %
Patient temperature: 98.6
pCO2 arterial: 81.3 mmHg (ref 32.0–48.0)
pH, Arterial: 7.277 — ABNORMAL LOW (ref 7.350–7.450)
pO2, Arterial: 77.1 mmHg — ABNORMAL LOW (ref 83.0–108.0)

## 2016-12-28 LAB — BASIC METABOLIC PANEL
Anion gap: 9 (ref 5–15)
BUN: 26 mg/dL — AB (ref 6–20)
CHLORIDE: 90 mmol/L — AB (ref 101–111)
CO2: 37 mmol/L — ABNORMAL HIGH (ref 22–32)
CREATININE: 0.63 mg/dL (ref 0.44–1.00)
Calcium: 8.8 mg/dL — ABNORMAL LOW (ref 8.9–10.3)
GFR calc Af Amer: >60 mL/min (ref >60)
GFR calc non Af Amer: >60 mL/min (ref >60)
GLUCOSE: 200 mg/dL — AB (ref 65–99)
POTASSIUM: 5 mmol/L (ref 3.5–5.1)
Sodium: 136 mmol/L (ref 135–145)

## 2016-12-28 LAB — CBC
HCT: 45.3 % (ref 36.0–46.0)
HEMOGLOBIN: 14.4 g/dL (ref 12.0–15.0)
MCH: 29.6 pg (ref 26.0–34.0)
MCHC: 31.8 g/dL (ref 30.0–36.0)
MCV: 93 fL (ref 78.0–100.0)
Platelets: 291 10*3/uL (ref 150–400)
RBC: 4.87 MIL/uL (ref 3.87–5.11)
RDW: 15.8 % — ABNORMAL HIGH (ref 11.5–15.5)
WBC: 28.6 10*3/uL — ABNORMAL HIGH (ref 4.0–10.5)

## 2016-12-28 LAB — MAGNESIUM: Magnesium: 2.2 mg/dL (ref 1.7–2.4)

## 2016-12-28 LAB — LACTIC ACID, PLASMA: Lactic Acid, Venous: 1.1 mmol/L (ref 0.5–1.9)

## 2016-12-28 MED ORDER — METOPROLOL TARTRATE 5 MG/5ML IV SOLN
5.0000 mg | Freq: Once | INTRAVENOUS | Status: AC
  Administered 2016-12-28: 5 mg via INTRAVENOUS
  Filled 2016-12-28: qty 5

## 2016-12-28 MED ORDER — METOPROLOL TARTRATE 5 MG/5ML IV SOLN
2.5000 mg | Freq: Four times a day (QID) | INTRAVENOUS | Status: DC
  Administered 2016-12-28 – 2017-01-01 (×5): 2.5 mg via INTRAVENOUS
  Filled 2016-12-28 (×16): qty 5

## 2016-12-28 MED ORDER — LORAZEPAM 2 MG/ML IJ SOLN
0.5000 mg | Freq: Once | INTRAMUSCULAR | Status: AC
  Administered 2016-12-28: 0.5 mg via INTRAVENOUS
  Filled 2016-12-28: qty 1

## 2016-12-28 MED ORDER — LORAZEPAM 2 MG/ML IJ SOLN
0.5000 mg | Freq: Four times a day (QID) | INTRAMUSCULAR | Status: DC | PRN
  Administered 2016-12-28 (×2): 0.5 mg via INTRAVENOUS
  Filled 2016-12-28 (×2): qty 1

## 2016-12-28 MED ORDER — CHLORHEXIDINE GLUCONATE 0.12 % MT SOLN
15.0000 mL | Freq: Two times a day (BID) | OROMUCOSAL | Status: DC
  Administered 2016-12-29 (×2): 15 mL via OROMUCOSAL
  Filled 2016-12-28: qty 15

## 2016-12-28 MED ORDER — ORAL CARE MOUTH RINSE
15.0000 mL | Freq: Two times a day (BID) | OROMUCOSAL | Status: DC
  Administered 2016-12-29 – 2016-12-30 (×4): 15 mL via OROMUCOSAL

## 2016-12-28 MED ORDER — ENOXAPARIN SODIUM 60 MG/0.6ML ~~LOC~~ SOLN
55.0000 mg | Freq: Two times a day (BID) | SUBCUTANEOUS | Status: DC
  Administered 2016-12-28 – 2017-01-01 (×8): 55 mg via SUBCUTANEOUS
  Filled 2016-12-28: qty 0.6
  Filled 2016-12-28 (×7): qty 0.55

## 2016-12-28 NOTE — Progress Notes (Signed)
   12/28/16 0303  Vitals  BP 112/70  MAP (mmHg) 80  BP Location Left Arm  BP Method Automatic  Patient Position (if appropriate) Lying  Pulse Rate (!) 117  Pulse Rate Source Dinamap  Resp (!) 24  Oxygen Therapy  SpO2 93 %  O2 Device Venturi Mask  O2 Flow Rate (L/min) 6 L/min  FiO2 (%) 35 %  Pt placed on venturi mask, oxygen sat down to 88% on 3L [place].

## 2016-12-28 NOTE — Progress Notes (Signed)
Patient transported to 4E12 without any apparent complications.

## 2016-12-28 NOTE — Significant Event (Addendum)
Rapid Response Event Note  Overview:Called by beside RN.  Pt sats-70s on 3L [place], HR-130s Afib. Time Called: 0157 Arrival Time: 0200 Event Type: Respiratory, Cardiac  Initial Focused Assessment: Pt resting in bed, alert and oriented, Pt with  inc RR-30s, slight accessory muscle use.  Pt lungs sounds with decreased air mvmt.  VS: HR-127, BP-131/79, RR-30s, sats 100% on PRB.  Interventions: Pt placed on PRB and EKG obtained PTA RRT.  Schorr, NP paged regarding events/changes. Pt FiO2 titrated down to 4L [place].  Plan of Care (if not transferred): Monitor pt.  Continuous pulse ox. Titrate O2 down as tolerated. Await NP return call. Call RRT if assistance needed. Event Summary: Name of Physician Notified: Schorr, NP at [place]    at    Outcome: Stayed in room and stabalized    [place], Carren Rang

## 2016-12-28 NOTE — Progress Notes (Addendum)
Patient had become combative, pulling at mitts, trying to remove equipment and bipap. Patient given prn dose of 0.5 ativan given. Patient became lethargic and destated to mid 67s. RRT paged to come see patient. Bipap mask as been changed and settings increased patient now stating at 100% but is still lethargic. MD texted paged. STAT ABG ordered.   0721- Critical ABG result:  pCO2: 96.2   0241- Repeat ABG  PCO2: 94.3   CCS fellow Rahul PA-C saw patient at beside, patient responded to painful stimuli but otherwise still unresponsive. Due to ABG and worsening respiratory failure on Bipap patient was transferred to 61M where she will require intubation. Family has been contacted on change in patient's status and ICU placement. Lenna Sciara, RN 4:02 AM 12/29/2016

## 2016-12-28 NOTE — Progress Notes (Signed)
Pt confused, pulling her venturi mask and cardiac monitor, safety mittens placed, order for blood gas obtained, RT called.

## 2016-12-28 NOTE — Progress Notes (Signed)
OT Cancellations    12/28/16 0800  OT Visit Information  Last OT Received On 12/28/16  Reason Eval/Treat Not Completed Medical issues which prohibited therapy Per RN, pt not appropriate for therapy at this time due to respiratory issue. Pt transferring to step down unit. Will follow up later.   [employer], OTR/L [phone]

## 2016-12-28 NOTE — Progress Notes (Signed)
Late Entry  Received patient as a rapid response transfer from [place]. Currently on bipap at 40%. Tolerating well, O2 sats stable. Patient is extremely confused and agitated at present. Dr. Ree Kida notified - orders received for ativan and bedside safety sitter.   Joellen Jersey, RN

## 2016-12-28 NOTE — Progress Notes (Signed)
RT called for ABG, RT obtained ABG and gave results to RN. RRT verbal for NTS. RT gave tx and discussed NTS with RN and pt. Pt agreed to NTS. RT NTS pt with 12Fr with moderate return of thick plug tan/green secretions. Pt tolerated procedure well. Pt sounds much better after NTS, however pt is not oriented at this time. Pt keeps yelling "Oh Lord" and trying to take off mitts. RT to monitor as needed.

## 2016-12-28 NOTE — Progress Notes (Signed)
PT Cancellation Note  Patient Details Name: [name] MRN: [id number] DOB: 1933-07-03   Cancelled Treatment:    Reason Eval/Treat Not Completed: Medical issues which prohibited therapy (pt now on Bipap with transfer to SDU and not medically appropriate)   Colonel Krauser B Chaselyn Nanney 12/28/2016, 9:48 AM  Elwyn Reach, [place]

## 2016-12-28 NOTE — Progress Notes (Signed)
Inpatient Diabetes Program Recommendations  AACE/ADA: New Consensus Statement on Inpatient Glycemic Control (2015)  Target Ranges:  Prepandial:   less than 140 mg/dL      Peak postprandial:   less than 180 mg/dL (1-2 hours)      Critically ill patients:  140 - 180 mg/dL   Results for [name], [name] (MRN [id number]) as of 12/28/2016 13:38  Ref. Range 12/26/2016 02:54 12/28/2016 05:33  Glucose Latest Ref Range: 65 - 99 mg/dL 254 (H) 200 (H)    Review of Glycemic Control  Diabetes history: none, on steroids  Outpatient Diabetes medications: none, on home steroids  Current orders for Inpatient glycemic control: none  Inpatient Diabetes Program Recommendations:  Please consider sensitive correction scale Novolog 0-9 units TIDAC and 0-5 units QHS with POCT of CBG's TIDAC & HS.  Thank you,  Windy Carina, RN, MSN Diabetes Coordinator Inpatient Diabetes Program [phone] (Team Pager)

## 2016-12-28 NOTE — Progress Notes (Signed)
Pt continued to be restless, more confused, oxygen sat 92 on 35% venturi mask. Dr. Ree Kida notified and ordered to transfer pt to SDU and place on continuous Bipap. Day RN updated.

## 2016-12-28 NOTE — Progress Notes (Signed)
ANTICOAGULATION CONSULT NOTE - Initial Consult  Pharmacy Consult for Lovenox Indication: atrial fibrillation  Allergies  Allergen Reactions  . Amoxicillin-Pot Clavulanate Other (See Comments)    GI upset  . Daliresp [Roflumilast] Other (See Comments)    unknown  . Diltiazem Nausea Only  . Montelukast Sodium Other (See Comments)     flu-like symptoms  . Zafirlukast Other (See Comments)    unknown  . Adhesive [Tape] Rash    Please use paper tape    Patient Measurements: Height: 5\' 2"  (157.5 cm) Weight: 120 lb 11.2 oz (54.7 kg) IBW/kg (Calculated) : 50.1    Vital Signs: Temp: 98.4 F (36.9 C) (04/09 1155) Temp Source: Axillary (04/09 1155) BP: 135/84 (04/09 1155) Pulse Rate: 105 (04/09 1533)  Labs:  Recent Labs  12/26/16 0254 12/28/16 0533  HGB 12.8 14.4  HCT 39.9 45.3  PLT 217 291  CREATININE 0.68 0.63   Estimated Creatinine Clearance: 43.6 mL/min (by C-G formula based on SCr of 0.63 mg/dL).  Medical History: Past Medical History:  Diagnosis Date  . Allergic rhinitis    takes Mucinex daily  . Anxiety    takes Alprazolam daily  . Aortic stenosis    mild by echo (2008)  . Asthma    uses Albuterol daily as needed. Pulmicort and Perforomist neb daily  . Bronchitis, chronic obstructive, with exacerbation ([place])   . Bruises easily   . Cataract    right  . Chronic back pain    HNP  . COPD (chronic obstructive pulmonary disease) (HCC)    Xopenex and Atrobent neb daily   . GERD (gastroesophageal reflux disease)    takes Omeprazole daily  . Hemorrhoid   . History of bronchitis    many yrs ago  . History of colon polyps   . History of gout    no meds required  . History of staph infection   . Hyperlipidemia    takes Simvastatin daily  . Hypertension    takes Losartan daily  . Joint pain   . Joint swelling   . Multiple sclerosis ([place])   . Paroxysmal atrial fibrillation ([place]) 11/08  . Peripheral edema   . Pneumonia    hx of-as a baby  . PONV  (postoperative nausea and vomiting)   . Rheumatoid arthritis(714.0)   . Shortness of breath    with exertion  . Stasis dermatitis     Medications:  Prescriptions Prior to Admission  Medication Sig Dispense Refill Last Dose  . ANORO ELLIPTA 62.5-25 MCG/INH AEPB INHALE 1 PUFF INTO THE LUNGS DAILY. 60 each 0 01/03/2017 at Unknown time  . apixaban (ELIQUIS) 5 MG TABS tablet Take 1 tablet (5 mg total) by mouth 2 (two) times daily. 60 tablet 0 01/12/2017 at 0730  . atenolol (TENORMIN) 50 MG tablet Take 1 tablet (50 mg total) by mouth daily. 30 tablet 0 12/28/2016 at 0730  . budesonide (PULMICORT) 0.25 MG/2ML nebulizer solution USE 1 VIAL IN NEBULIZER TWICE A DAY 120 mL 6 12/23/2016 at Unknown time  . formoterol (PERFOROMIST) 20 MCG/2ML nebulizer solution USE 1 VIAL IN NEBULIZER TWICE A DAY 120 mL 6 01/02/2017 at Unknown time  . gabapentin (NEURONTIN) 300 MG capsule Take 300 mg by mouth 4 (four) times daily.   3 01/03/2017 at Unknown time  . guaiFENesin (MUCINEX) 600 MG 12 hr tablet Take 600 mg by mouth 2 (two) times daily as needed for cough or to loosen phlegm.    Past Month at Unknown time  .  hydrochlorothiazide 25 MG tablet Take 25 mg by mouth daily.    12/20/2016 at Unknown time  . ipratropium (ATROVENT) 0.02 % nebulizer solution Take 2.5 mLs (0.5 mg total) by nebulization 3 (three) times daily as needed for wheezing or shortness of breath. DX code J44.9. 187.5 mL 6 Past Month at Unknown time  . levalbuterol (XOPENEX) 0.63 MG/3ML nebulizer solution Take 3 mLs (0.63 mg total) by nebulization every 6 (six) hours as needed for wheezing or shortness of breath. 360 mL 5 Past Month at Unknown time  . omeprazole (PRILOSEC) 20 MG capsule Take 20 mg by mouth daily.   12/28/2016 at Unknown time  . PROAIR HFA 108 (90 BASE) MCG/ACT inhaler INHALE 2 PUFFS BY MOUTH EVERY 6 HOURS AS NEEDED FOR WHEEZING OR SHORTNESS OF BREATH 8.5 Inhaler 2 Past Month at Unknown time  . simvastatin (ZOCOR) 20 MG tablet Take 20 mg by mouth daily.     12/26/2016 at Unknown time    Assessment: 83 yo female on Eliquis PTA for atrial fibrillation. Last dose 4/8 @2100 , and patient is now NPO. CBC stable WNL. No bleeding reported. Pharmacy has been asked to dose Lovenox while NPO.  Goal of Therapy:  Anti-Xa level 0.6-1 units/ml 4hrs after LMWH dose given Monitor platelets by anticoagulation protocol: Yes   Plan:  Lovenox 55mg  BID Monitor renal function and s/sx of bleeding  Georga Bora, PharmD Clinical Pharmacist Pager: [phone] 12/28/2016 5:11 PM

## 2016-12-28 NOTE — Progress Notes (Signed)
PROGRESS NOTE    [name]  WEX:[id number] DOB: May 13, 1933 DOA: 01/03/2017 PCP: Lillard Anes, MD   Chief Complaint  Patient presents with  . Shortness of Breath     Brief Narrative:  Pt. with PMH of steroid dependent COPD, nocturnal O2, A. fib on Eliquis, aortic stenosis, anxiety, GERD, MS, RA; admitted on 01/13/2017, with complaint of shortness of breath and cough, was found to have RSV bronchiolitis. Currently further plan is supportive treatment. Assessment & Plan   Acute hypoxic respiratory failure with hypoxia/RSV bronchiolitis -Respiratory panel +RSV -CXR unremarkable for infection -Continue steroids, nebs, Azithromycin, ceftriaxone, mucinex, flutter valve -SpO2 down to 78% -Patient also with altered mental status -Transferred to Stepdown and placed on BIPAP -Have placed on ativan PRN  Atrial fibrillation -Continuing Eliquis -Currently rate controlled at present.  Essential hypertension -Continuing atenolol at present. Holding HCTZ  GERD -Continue PPI  Neuropathy -Continue gabapentin  Chronic diastolic dysfunction -No evidence of volume overload  -Continue to monitor daily weights and intake/output  Anxiety -Uses xanax. Given respiratory status, will place on IV ativan and monitor closely  Acute encephalopathy -Likely secondary to hypoxia -Placed on BiPAP -Continue sitter/Ativan  -Continue to monitor   DVT Prophylaxis  Eliquis  Code Status: Full  Family Communication: None at bedside   Disposition Plan: Admitted. Continue to monitor in stepdown  Consultants None  Procedures  None  Antibiotics   Anti-infectives    Start     Dose/Rate Route Frequency Ordered Stop   12/26/16 1830  azithromycin (ZITHROMAX) 500 mg in dextrose 5 % 250 mL IVPB  Status:  Discontinued     500 mg 250 mL/hr over 60 Minutes Intravenous Every 24 hours 01/08/2017 1803 12/26/16 1124   12/26/16 1230  azithromycin (ZITHROMAX) 500 mg in dextrose 5 % 250 mL IVPB      500 mg 250 mL/hr over 60 Minutes Intravenous Every 24 hours 12/26/16 1124     01/16/2017 1830  cefTRIAXone (ROCEPHIN) 1 g in dextrose 5 % 50 mL IVPB     1 g 100 mL/hr over 30 Minutes Intravenous Every 24 hours 01/17/2017 1803     12/21/2016 1800  azithromycin (ZITHROMAX) 500 mg in dextrose 5 % 250 mL IVPB  Status:  Discontinued     500 mg 250 mL/hr over 60 Minutes Intravenous Every 24 hours 12/23/2016 1639 12/24/2016 1803   12/29/2016 1645  cefTRIAXone (ROCEPHIN) 1 g in dextrose 5 % 50 mL IVPB  Status:  Discontinued     1 g 100 mL/hr over 30 Minutes Intravenous Every 24 hours 12/24/2016 1642 01/11/2017 1803      Subjective:   [name] seen and examined today.  Patient currently altered. Moving all extremities.    Objective:   Vitals:   12/28/16 0834 12/28/16 0949 12/28/16 1149 12/28/16 1155  BP: 139/72 (!) 149/90  135/84  Pulse: 91 93 97   Resp: (!) 23 20 (!) 27   Temp: 99.3 F (37.4 C)   98.4 F (36.9 C)  TempSrc: Rectal   Axillary  SpO2: 98% 100% 93%   Weight:      Height:        Intake/Output Summary (Last 24 hours) at 12/28/16 1434 Last data filed at 12/28/16 1000  Gross per 24 hour  Intake              360 ml  Output             2451 ml  Net            -  2091 ml   Filed Weights   12/26/16 0641 12/27/16 0546 12/28/16 0417  Weight: 52.8 kg (116 lb 4.8 oz) 53.1 kg (117 lb 1.6 oz) 54.7 kg (120 lb 11.2 oz)    Exam  General: Well developed, well nourished, no distress  HEENT: NCAT,mucous membranes moist. BiPAP  Cardiovascular: S1 S2 auscultated, +murmur  Respiratory: Diminished, expiratory wheezing   Abdomen: Soft, nontender, nondistended, + bowel sounds  Extremities: warm dry without cyanosis clubbing or edema  Neuro: AAOx1 (self only), moves all extremities  Data Reviewed: I have personally reviewed following labs and imaging studies  CBC:  Recent Labs Lab 12/20/2016 1232 12/26/16 0254 12/28/16 0533  WBC 14.1* 9.2 28.6*  NEUTROABS 10.4* 8.7*  --   HGB  14.0 12.8 14.4  HCT 43.3 39.9 45.3  MCV 92.1 90.7 93.0  PLT 229 217 654   Basic Metabolic Panel:  Recent Labs Lab 01/10/2017 1232 01/04/2017 1633 12/26/16 0254 12/28/16 0533  NA 138  --  138 136  K 3.4*  --  4.0 5.0  CL 94*  --  94* 90*  CO2 34*  --  33* 37*  GLUCOSE 90  --  254* 200*  BUN 13  --  18 26*  CREATININE 0.64  --  0.68 0.63  CALCIUM 9.0  --  8.7* 8.8*  MG  --  1.8  --  2.2   GFR: Estimated Creatinine Clearance: 43.6 mL/min (by C-G formula based on SCr of 0.63 mg/dL). Liver Function Tests: No results for input(s): AST, ALT, ALKPHOS, BILITOT, PROT, ALBUMIN in the last 168 hours. No results for input(s): LIPASE, AMYLASE in the last 168 hours. No results for input(s): AMMONIA in the last 168 hours. Coagulation Profile: No results for input(s): INR, PROTIME in the last 168 hours. Cardiac Enzymes: No results for input(s): CKTOTAL, CKMB, CKMBINDEX, TROPONINI in the last 168 hours. BNP (last 3 results) No results for input(s): PROBNP in the last 8760 hours. HbA1C: No results for input(s): HGBA1C in the last 72 hours. CBG: No results for input(s): GLUCAP in the last 168 hours. Lipid Profile: No results for input(s): CHOL, HDL, LDLCALC, TRIG, CHOLHDL, LDLDIRECT in the last 72 hours. Thyroid Function Tests: No results for input(s): TSH, T4TOTAL, FREET4, T3FREE, THYROIDAB in the last 72 hours. Anemia Panel: No results for input(s): VITAMINB12, FOLATE, FERRITIN, TIBC, IRON, RETICCTPCT in the last 72 hours. Urine analysis:    Component Value Date/Time   COLORURINE YELLOW 12/07/2011 1604   APPEARANCEUR CLOUDY (A) 12/07/2011 1604   LABSPEC 1.012 12/07/2011 1604   PHURINE 6.0 12/07/2011 1604   GLUCOSEU NEGATIVE 12/07/2011 1604   HGBUR NEGATIVE 12/07/2011 1604   BILIRUBINUR NEGATIVE 12/07/2011 1604   KETONESUR TRACE (A) 12/07/2011 1604   PROTEINUR NEGATIVE 12/07/2011 1604   UROBILINOGEN 0.2 12/07/2011 1604   NITRITE NEGATIVE 12/07/2011 1604   LEUKOCYTESUR NEGATIVE  12/07/2011 1604   Sepsis Labs: @LABRCNTIP (procalcitonin:4,lacticidven:4)  ) Recent Results (from the past 240 hour(s))  Respiratory Panel by PCR     Status: Abnormal   Collection Time: 01/04/2017  4:36 PM  Result Value Ref Range Status   Adenovirus NOT DETECTED NOT DETECTED Final   Coronavirus 229E NOT DETECTED NOT DETECTED Final   Coronavirus HKU1 NOT DETECTED NOT DETECTED Final   Coronavirus NL63 NOT DETECTED NOT DETECTED Final   Coronavirus OC43 NOT DETECTED NOT DETECTED Final   Metapneumovirus NOT DETECTED NOT DETECTED Final   Rhinovirus / Enterovirus NOT DETECTED NOT DETECTED Final   Influenza A NOT DETECTED NOT DETECTED  Final   Influenza B NOT DETECTED NOT DETECTED Final   Parainfluenza Virus 1 NOT DETECTED NOT DETECTED Final   Parainfluenza Virus 2 NOT DETECTED NOT DETECTED Final   Parainfluenza Virus 3 NOT DETECTED NOT DETECTED Final   Parainfluenza Virus 4 NOT DETECTED NOT DETECTED Final   Respiratory Syncytial Virus DETECTED (A) NOT DETECTED Final    Comment: CRITICAL RESULT CALLED TO, READ BACK BY AND VERIFIED WITH: Starlyn Skeans, RN, 12/29/2016 AT 2034 BY J FUDESCO    Bordetella pertussis NOT DETECTED NOT DETECTED Final   Chlamydophila pneumoniae NOT DETECTED NOT DETECTED Final   Mycoplasma pneumoniae NOT DETECTED NOT DETECTED Final  MRSA PCR Screening     Status: Abnormal   Collection Time: 12/28/2016  4:59 PM  Result Value Ref Range Status   MRSA by PCR POSITIVE (A) NEGATIVE Final    Comment:        The GeneXpert MRSA Assay (FDA approved for NASAL specimens only), is one component of a comprehensive MRSA colonization surveillance program. It is not intended to diagnose MRSA infection nor to guide or monitor treatment for MRSA infections. RESULT CALLED TO, READ BACK BY AND VERIFIED WITHStarlyn Skeans RN 2016 01/16/2017 A BROWNING       Radiology Studies: Dg Chest Port 1 View  Result Date: 12/28/2016 CLINICAL DATA:  83 y/o  F; shortness of breath. EXAM: PORTABLE CHEST 1 VIEW  COMPARISON:  12/20/2016 chest radiograph FINDINGS: Stable normal cardiac silhouette given projection and technique. Aortic atherosclerosis with calcification. Clear lungs. No pleural effusion or pneumothorax. Bones are unremarkable. IMPRESSION: No active disease. Electronically Signed   By: Kristine Garbe M.D.   On: 12/28/2016 02:51     Scheduled Meds: . ALPRAZolam  0.25 mg Oral Daily  . apixaban  2.5 mg Oral BID  . atenolol  50 mg Oral Daily  . azithromycin  500 mg Intravenous Q24H  . cefTRIAXone (ROCEPHIN)  IV  1 g Intravenous Q24H  . Chlorhexidine Gluconate Cloth  6 each Topical Q0600  . feeding supplement (GLUCERNA SHAKE)  237 mL Oral BID BM  . gabapentin  300 mg Oral QID  . ipratropium  0.5 mg Nebulization QID  . levalbuterol  0.63 mg Nebulization QID  . methylPREDNISolone (SOLU-MEDROL) injection  60 mg Intravenous Q6H  . mupirocin ointment  1 application Nasal BID  . pantoprazole  40 mg Oral Daily  . simvastatin  20 mg Oral Daily  . sodium chloride flush  3 mL Intravenous Q12H   Continuous Infusions:   LOS: 2 days   Time Spent in minutes   30 minutes  Tilak Oakley D.O. on 12/28/2016 at 2:34 PM  Between 7am to 7pm - Pager - [phone]  After 7pm go to [email or web address] - password [email or web address]  And look for the night coverage person covering for me after hours  [facility] Office  [phone]

## 2016-12-28 NOTE — Progress Notes (Signed)
   12/28/16 0157  Vitals  BP 131/79  MAP (mmHg) 92  BP Location Right Arm  BP Method Automatic  Patient Position (if appropriate) Lying  Pulse Rate (!) 127  Pulse Rate Source Dinamap  Cardiac Rhythm Atrial fibrillation  Oxygen Therapy  SpO2 100 %  O2 Device Non-rebreather Mask  Pt c/o SOB, oxygen sat in the 70's on 3L [place], placed on NRB sat 100% RR in the 30's. Rapid response notified and came to assessed the patient. Lamar Blinks NP notified, awaiting call back.

## 2016-12-28 NOTE — Progress Notes (Signed)
Patient on Bipap, O2 Sats 97%. Patient calm  But still confused Rapid Response RN at bedside.  Transferred to [facility] report given to Cybil RN.Called the husbands phone [phone] able to speak with grandaughter Brooke Pace, made aware of transfer.

## 2016-12-28 NOTE — Progress Notes (Signed)
   12/28/16 0030  Vitals  Temp 98.4 F (36.9 C)  Temp Source Oral  BP 116/78  MAP (mmHg) 87  BP Location Right Arm  BP Method Automatic  Patient Position (if appropriate) Lying  Pulse Rate (!) 134  Pulse Rate Source Dinamap  Cardiac Rhythm Atrial fibrillation  Resp (!) 22  Oxygen Therapy  SpO2 95 %  O2 Device Nasal Cannula  O2 Flow Rate (L/min) 3 L/min  [name] HR in the 130's Afib sustained. Lamar Blinks NP notified, ordered to obtain EKG Stat.

## 2016-12-28 NOTE — Progress Notes (Signed)
qPhysical Therapy Treatment Patient Details Name: [name] MRN: [id number] DOB: 03-30-1933 Today's Date: 12/28/2016    History of Present Illness Pt is an 82 y/o female admitted from PCP office secondary to SOB, acute respiratory failure secondary to a COPD exacerbation. PMH including but not limited to COPD, PAF, HTN and HLD.    PT Comments    Patient with significant decline in function since previous session, patient with increased AMS and is incredible restless this session. PT entered room with 3 other nurses assist patient, attempted to de-escalate patient agitation by assisting patient safely to EOB and allowing patient to perform sit <> stand and EOB activity. Limited activity due to patient on Bi PAP. HR fluctuating but patient able to calm HR to 110s at lowest and peak HR of upper 160s. O2 saturations stable on bi pap >90%. Patient with some incontinence, hygiene and pericare performed. Utilized music to assist with agitation and relaxation, this appeared effective with noted improvements in restlessness and agitation while EOB.   Giving current functional decline, feel patient is not safe for d/c home without follow up as previously indicated. Will follow progression and update recommendations as appropriate. If AMS does not resolve may need to consider SNF upon discharge, vs home with assist.  Will follow acutely.   Follow Up Recommendations  Home health PT;Supervision/Assistance - 24 hour (pending resolution of AMS, otherwise will need SNF)     Equipment Recommendations  None recommended by PT    Recommendations for Other Services       Precautions / Restrictions Precautions Precautions: Fall Precaution Comments: biPap Restrictions Weight Bearing Restrictions: No    Mobility  Bed Mobility Overal bed mobility: Needs Assistance Bed Mobility: Supine to Sit;Sit to Supine     Supine to sit: Mod assist Sit to supine: Mod assist   General bed mobility comments:  Moderate assist for safety with all aspects fo mobility due to agitation and restlessness  Transfers Overall transfer level: Needs assistance Equipment used: None Transfers: Sit to/from Stand Sit to Stand: Mod assist         General transfer comment: Moderate assist for stability, patient extremely impuslive and restless at this time (performed x6 during session)  Ambulation/Gait                 Stairs            Wheelchair Mobility    Modified Rankin (Stroke Patients Only)       Balance Overall balance assessment: Needs assistance Sitting-balance support: Feet supported Sitting balance-Leahy Scale: Poor Sitting balance - Comments: required hands on assist for stability in sitting EOB   Standing balance support: During functional activity;No upper extremity supported Standing balance-Leahy Scale: Poor Standing balance comment: required hands on assist due to confusion and restlessness. poor insight and awareness, very unsafe. Patient stood x6 (hygiene and pericare performed in standing)                            Cognition Arousal/Alertness: Awake/alert Behavior During Therapy: Restless;Anxious;Impulsive Overall Cognitive Status: Impaired/Different from baseline Area of Impairment: Attention;Following commands;Safety/judgement;Awareness;Problem solving                   Current Attention Level: Focused   Following Commands: Follows one step commands inconsistently Safety/Judgement: Decreased awareness of safety;Decreased awareness of deficits Awareness: Intellectual Problem Solving: Slow processing;Decreased initiation;Difficulty sequencing;Requires verbal cues;Requires tactile cues        Exercises  General Comments General comments (skin integrity, edema, etc.): tolerated EOB >20 minutes with increased restlessness and impulsivity. Increased assist for all activities at EOB for safety.      Pertinent Vitals/Pain Pain  Assessment: Faces Faces Pain Scale: No hurt    Home Living                      Prior Function            PT Goals (current goals can now be found in the care plan section) Acute Rehab PT Goals Patient Stated Goal: return home PT Goal Formulation: With patient Time For Goal Achievement: 01/09/17 Potential to Achieve Goals: Good Progress towards PT goals: Not progressing toward goals - comment (AMS decreased resp function)    Frequency    Min 3X/week      PT Plan Discharge plan needs to be updated    Co-evaluation             End of Session Equipment Utilized During Treatment: Oxygen (biPap) Activity Tolerance: Patient tolerated treatment well Patient left: in bed;with call bell/phone within reach;Other (comment);with nursing/sitter in room;with bed alarm set (mitts applied) Nurse Communication: Mobility status PT Visit Diagnosis: Unsteadiness on feet (R26.81)     Time: 0086-7619 PT Time Calculation (min) (ACUTE ONLY): 28 min  Charges:  $Therapeutic Activity: 23-37 mins                    G Codes:       Alben Deeds, PT DPT  [phone]    Duncan Dull 12/28/2016, 5:55 PM

## 2016-12-28 NOTE — Progress Notes (Signed)
Chest xray done, pt given metoprolol 5mg  IV per order.

## 2016-12-29 ENCOUNTER — Inpatient Hospital Stay (HOSPITAL_COMMUNITY): Payer: Medicare Other

## 2016-12-29 DIAGNOSIS — J9621 Acute and chronic respiratory failure with hypoxia: Secondary | ICD-10-CM

## 2016-12-29 DIAGNOSIS — I959 Hypotension, unspecified: Secondary | ICD-10-CM

## 2016-12-29 DIAGNOSIS — J9601 Acute respiratory failure with hypoxia: Secondary | ICD-10-CM

## 2016-12-29 DIAGNOSIS — J441 Chronic obstructive pulmonary disease with (acute) exacerbation: Secondary | ICD-10-CM

## 2016-12-29 DIAGNOSIS — B974 Respiratory syncytial virus as the cause of diseases classified elsewhere: Secondary | ICD-10-CM

## 2016-12-29 DIAGNOSIS — G9340 Encephalopathy, unspecified: Secondary | ICD-10-CM

## 2016-12-29 DIAGNOSIS — J9622 Acute and chronic respiratory failure with hypercapnia: Secondary | ICD-10-CM

## 2016-12-29 LAB — BASIC METABOLIC PANEL
Anion gap: 7 (ref 5–15)
Anion gap: 10 (ref 5–15)
BUN: 26 mg/dL — ABNORMAL HIGH (ref 6–20)
BUN: 26 mg/dL — ABNORMAL HIGH (ref 6–20)
BUN: 28 mg/dL — AB (ref 6–20)
CALCIUM: 8.1 mg/dL — AB (ref 8.9–10.3)
CALCIUM: 8.1 mg/dL — AB (ref 8.9–10.3)
CHLORIDE: 90 mmol/L — AB (ref 101–111)
CHLORIDE: 91 mmol/L — AB (ref 101–111)
CO2: 39 mmol/L — ABNORMAL HIGH (ref 22–32)
CO2: 36 mmol/L — ABNORMAL HIGH (ref 22–32)
CREATININE: 0.54 mg/dL (ref 0.44–1.00)
CREATININE: 0.64 mg/dL (ref 0.44–1.00)
Calcium: 8.7 mg/dL — ABNORMAL LOW (ref 8.9–10.3)
Chloride: 93 mmol/L — ABNORMAL LOW (ref 101–111)
Creatinine, Ser: 0.44 mg/dL (ref 0.44–1.00)
GFR calc non Af Amer: >60 mL/min (ref >60)
GFR calc non Af Amer: >60 mL/min (ref >60)
GLUCOSE: 171 mg/dL — AB (ref 65–99)
Glucose, Bld: 162 mg/dL — ABNORMAL HIGH (ref 65–99)
Glucose, Bld: 181 mg/dL — ABNORMAL HIGH (ref 65–99)
POTASSIUM: 4.9 mmol/L (ref 3.5–5.1)
Potassium: 4.6 mmol/L (ref 3.5–5.1)
Potassium: 4 mmol/L (ref 3.5–5.1)
SODIUM: 136 mmol/L (ref 135–145)
SODIUM: 139 mmol/L (ref 135–145)
Sodium: 152 mmol/L — ABNORMAL HIGH (ref 135–145)

## 2016-12-29 LAB — GLUCOSE, CAPILLARY
GLUCOSE-CAPILLARY: 172 mg/dL — AB (ref 65–99)
GLUCOSE-CAPILLARY: 277 mg/dL — AB (ref 65–99)
Glucose-Capillary: 229 mg/dL — ABNORMAL HIGH (ref 65–99)
Glucose-Capillary: 304 mg/dL — ABNORMAL HIGH (ref 65–99)

## 2016-12-29 LAB — BLOOD GAS, ARTERIAL
ACID-BASE EXCESS: 12.8 mmol/L — AB (ref 0.0–2.0)
Acid-Base Excess: 12.7 mmol/L — ABNORMAL HIGH (ref 0.0–2.0)
BICARBONATE: 40.1 mmol/L — AB (ref 20.0–28.0)
BICARBONATE: 40.4 mmol/L — AB (ref 20.0–28.0)
DELIVERY SYSTEMS: POSITIVE
Delivery systems: POSITIVE
Drawn by: 365271
Drawn by: 365271
EXPIRATORY PAP: 6
EXPIRATORY PAP: 6
FIO2: 100
FIO2: 40
INSPIRATORY PAP: 20
Inspiratory PAP: 12
O2 SAT: 99.4 %
O2 Saturation: 94.7 %
PATIENT TEMPERATURE: 98.1
PATIENT TEMPERATURE: 98.6
PCO2 ART: 96.2 mmHg — AB (ref 32.0–48.0)
PH ART: 7.245 — AB (ref 7.350–7.450)
PO2 ART: 451 mmHg — AB (ref 83.0–108.0)
RATE: 16 resp/min
RATE: 28 resp/min
pCO2 arterial: 94.3 mmHg (ref 32.0–48.0)
pH, Arterial: 7.252 — ABNORMAL LOW (ref 7.350–7.450)
pO2, Arterial: 77.8 mmHg — ABNORMAL LOW (ref 83.0–108.0)

## 2016-12-29 LAB — CBC
HCT: 46.5 % — ABNORMAL HIGH (ref 36.0–46.0)
HCT: 42.3 % (ref 36.0–46.0)
HEMOGLOBIN: 14.6 g/dL (ref 12.0–15.0)
HEMOGLOBIN: 13.3 g/dL (ref 12.0–15.0)
MCH: 29.6 pg (ref 26.0–34.0)
MCH: 29.2 pg (ref 26.0–34.0)
MCHC: 31.4 g/dL (ref 30.0–36.0)
MCHC: 31.4 g/dL (ref 30.0–36.0)
MCV: 94.1 fL (ref 78.0–100.0)
MCV: 92.8 fL (ref 78.0–100.0)
Platelets: 212 10*3/uL (ref 150–400)
Platelets: 180 10*3/uL (ref 150–400)
RBC: 4.94 MIL/uL (ref 3.87–5.11)
RBC: 4.56 MIL/uL (ref 3.87–5.11)
RDW: 15.8 % — ABNORMAL HIGH (ref 11.5–15.5)
RDW: 15.5 % (ref 11.5–15.5)
WBC: 20.5 10*3/uL — ABNORMAL HIGH (ref 4.0–10.5)
WBC: 13.7 10*3/uL — ABNORMAL HIGH (ref 4.0–10.5)

## 2016-12-29 LAB — POCT I-STAT 3, ART BLOOD GAS (G3+)
Acid-Base Excess: 16 mmol/L — ABNORMAL HIGH (ref 0.0–2.0)
Bicarbonate: 43.6 mmol/L — ABNORMAL HIGH (ref 20.0–28.0)
O2 Saturation: 97 %
PCO2 ART: 69.9 mmHg — AB (ref 32.0–48.0)
PH ART: 7.402 (ref 7.350–7.450)
TCO2: 46 mmol/L (ref 0–100)
pO2, Arterial: 89 mmHg (ref 83.0–108.0)

## 2016-12-29 LAB — TROPONIN I
TROPONIN I: 0.04 ng/mL — AB (ref <0.03)
Troponin I: 0.05 ng/mL (ref <0.03)
Troponin I: 0.06 ng/mL (ref <0.03)

## 2016-12-29 LAB — PROCALCITONIN: Procalcitonin: 0.13 ng/mL

## 2016-12-29 LAB — PHOSPHORUS: PHOSPHORUS: 2.3 mg/dL — AB (ref 2.5–4.6)

## 2016-12-29 LAB — TRIGLYCERIDES: Triglycerides: 218 mg/dL — ABNORMAL HIGH (ref <150)

## 2016-12-29 LAB — MAGNESIUM: MAGNESIUM: 2.4 mg/dL (ref 1.7–2.4)

## 2016-12-29 MED ORDER — SIMVASTATIN 20 MG PO TABS
20.0000 mg | ORAL_TABLET | Freq: Every day | ORAL | Status: DC
  Administered 2016-12-29 – 2017-01-01 (×4): 20 mg
  Filled 2016-12-29 (×4): qty 1

## 2016-12-29 MED ORDER — FENTANYL BOLUS VIA INFUSION
25.0000 ug | INTRAVENOUS | Status: DC | PRN
  Filled 2016-12-29: qty 25

## 2016-12-29 MED ORDER — IPRATROPIUM-ALBUTEROL 0.5-2.5 (3) MG/3ML IN SOLN
3.0000 mL | Freq: Four times a day (QID) | RESPIRATORY_TRACT | Status: DC
  Administered 2016-12-29: 3 mL via RESPIRATORY_TRACT
  Filled 2016-12-29: qty 3

## 2016-12-29 MED ORDER — LEVALBUTEROL HCL 0.63 MG/3ML IN NEBU: 0.6300 mg | INHALATION_SOLUTION | Freq: Four times a day (QID) | RESPIRATORY_TRACT | Status: DC | PRN

## 2016-12-29 MED ORDER — MIDAZOLAM HCL 2 MG/2ML IJ SOLN: 1.0000 mg | INTRAMUSCULAR | Status: DC | PRN

## 2016-12-29 MED ORDER — LEVALBUTEROL HCL 0.63 MG/3ML IN NEBU: 0.6300 mg | INHALATION_SOLUTION | RESPIRATORY_TRACT | Status: DC | PRN

## 2016-12-29 MED ORDER — FENTANYL BOLUS VIA INFUSION
50.0000 ug | INTRAVENOUS | Status: DC | PRN
  Administered 2016-12-30 – 2017-01-01 (×4): 50 ug via INTRAVENOUS
  Filled 2016-12-29: qty 200

## 2016-12-29 MED ORDER — FENTANYL 2500MCG IN NS 250ML (10MCG/ML) PREMIX INFUSION
25.0000 ug/h | INTRAVENOUS | Status: DC
  Administered 2016-12-29 (×2): 50 ug/h via INTRAVENOUS
  Administered 2016-12-31 – 2017-01-01 (×2): 100 ug/h via INTRAVENOUS
  Filled 2016-12-29 (×4): qty 250

## 2016-12-29 MED ORDER — METHYLPREDNISOLONE SODIUM SUCC 40 MG IJ SOLR
40.0000 mg | Freq: Two times a day (BID) | INTRAMUSCULAR | Status: DC
  Filled 2016-12-29: qty 1

## 2016-12-29 MED ORDER — IPRATROPIUM-ALBUTEROL 0.5-2.5 (3) MG/3ML IN SOLN
3.0000 mL | RESPIRATORY_TRACT | Status: DC
  Administered 2016-12-29 – 2017-01-01 (×19): 3 mL via RESPIRATORY_TRACT
  Filled 2016-12-29 (×19): qty 3

## 2016-12-29 MED ORDER — FENTANYL CITRATE (PF) 100 MCG/2ML IJ SOLN
INTRAMUSCULAR | Status: AC
  Administered 2016-12-29: 100 ug
  Filled 2016-12-29: qty 2

## 2016-12-29 MED ORDER — PROPOFOL 1000 MG/100ML IV EMUL
5.0000 ug/kg/min | INTRAVENOUS | Status: DC
  Administered 2016-12-29: 10 ug/kg/min via INTRAVENOUS
  Filled 2016-12-29 (×2): qty 100

## 2016-12-29 MED ORDER — SODIUM BICARBONATE 8.4 % IV SOLN
INTRAVENOUS | Status: AC
  Filled 2016-12-29: qty 50

## 2016-12-29 MED ORDER — FENTANYL 2500MCG IN NS 250ML (10MCG/ML) PREMIX INFUSION
25.0000 ug/h | INTRAVENOUS | Status: DC
  Filled 2016-12-29: qty 250

## 2016-12-29 MED ORDER — SODIUM CHLORIDE 0.9 % IV SOLN
25.0000 ug/h | INTRAVENOUS | Status: DC
  Filled 2016-12-29: qty 50

## 2016-12-29 MED ORDER — SODIUM BICARBONATE 8.4 % IV SOLN
100.0000 meq | Freq: Once | INTRAVENOUS | Status: DC
  Filled 2016-12-29: qty 100

## 2016-12-29 MED ORDER — FENTANYL CITRATE (PF) 100 MCG/2ML IJ SOLN
50.0000 ug | Freq: Once | INTRAMUSCULAR | Status: DC
  Filled 2016-12-29: qty 2

## 2016-12-29 MED ORDER — SODIUM CHLORIDE 0.9 % IV SOLN
0.0000 ug/min | INTRAVENOUS | Status: DC
  Filled 2016-12-29: qty 1

## 2016-12-29 MED ORDER — FENTANYL CITRATE (PF) 100 MCG/2ML IJ SOLN: 50.0000 ug | Freq: Once | INTRAMUSCULAR | Status: DC

## 2016-12-29 MED ORDER — INSULIN ASPART 100 UNIT/ML ~~LOC~~ SOLN: 0.0000 [IU] | Freq: Three times a day (TID) | SUBCUTANEOUS | Status: DC

## 2016-12-29 MED ORDER — SODIUM CHLORIDE 0.9 % IV BOLUS (SEPSIS)
1000.0000 mL | Freq: Once | INTRAVENOUS | Status: AC
  Administered 2016-12-29: 1000 mL via INTRAVENOUS

## 2016-12-29 MED ORDER — PANTOPRAZOLE SODIUM 40 MG PO PACK
40.0000 mg | PACK | Freq: Every day | ORAL | Status: DC
  Administered 2016-12-30 – 2017-01-01 (×3): 40 mg
  Filled 2016-12-29 (×4): qty 20

## 2016-12-29 MED ORDER — VITAL AF 1.2 CAL PO LIQD
1000.0000 mL | ORAL | Status: DC
  Administered 2016-12-29: 1000 mL

## 2016-12-29 MED ORDER — SODIUM BICARBONATE 8.4 % IV SOLN
100.0000 meq | Freq: Once | INTRAVENOUS | Status: AC
  Administered 2016-12-29: 50 meq via INTRAVENOUS

## 2016-12-29 MED ORDER — PRO-STAT SUGAR FREE PO LIQD: 30.0000 mL | Freq: Two times a day (BID) | ORAL | Status: DC

## 2016-12-29 MED ORDER — METHYLPREDNISOLONE SODIUM SUCC 125 MG IJ SOLR
60.0000 mg | Freq: Three times a day (TID) | INTRAMUSCULAR | Status: DC
  Administered 2016-12-29 – 2016-12-30 (×4): 60 mg via INTRAVENOUS
  Filled 2016-12-29: qty 2
  Filled 2016-12-29: qty 0.96
  Filled 2016-12-29: qty 2
  Filled 2016-12-29 (×2): qty 0.96

## 2016-12-29 MED ORDER — INSULIN ASPART 100 UNIT/ML ~~LOC~~ SOLN
0.0000 [IU] | SUBCUTANEOUS | Status: DC
  Administered 2016-12-29: 7 [IU] via SUBCUTANEOUS
  Administered 2016-12-29: 5 [IU] via SUBCUTANEOUS
  Administered 2016-12-30: 3 [IU] via SUBCUTANEOUS
  Administered 2016-12-30: 2 [IU] via SUBCUTANEOUS
  Administered 2016-12-30: 5 [IU] via SUBCUTANEOUS
  Administered 2016-12-30 (×2): 3 [IU] via SUBCUTANEOUS
  Administered 2016-12-31: 2 [IU] via SUBCUTANEOUS
  Administered 2016-12-31 (×2): 3 [IU] via SUBCUTANEOUS

## 2016-12-29 MED ORDER — MIDAZOLAM HCL 2 MG/2ML IJ SOLN
INTRAMUSCULAR | Status: AC
  Administered 2016-12-29: 2 mg
  Filled 2016-12-29: qty 2

## 2016-12-29 NOTE — Assessment & Plan Note (Signed)
Start tf

## 2016-12-29 NOTE — Progress Notes (Signed)
Wasted 250cc of fentanyl in sink with Huey Bienenstock RN

## 2016-12-29 NOTE — Consult Note (Signed)
PULMONARY / CRITICAL CARE MEDICINE   Name: [name] MRN: [id number] DOB: 1933-01-12    ADMISSION DATE:  01/15/2017 CONSULTATION DATE:  12/29/16  REFERRING MD:  Ree Kida  CHIEF COMPLAINT:  AMS  HISTORY OF PRESENT ILLNESS:  Pt is encephelopathic; therefore, this HPI is obtained from chart review. [name] is a 83 y.o. female with PMH as outlined below including COPD for which she uses supplemental O2 as needed. She went to her PCP on 01/11/2017 for shortness of breath and increased work of breathing and while there, was found to have SPO2 in the mid 80s. She was subsequently sent to the ED for further evaluation. In ED, she was felt to have AECOPD and was subsequently admitted by hospitalist for further management.  RVP noted positive for RSV.  On 4/9, she had worsening in her work of breathing; therefore, she was started on BiPAP and was transferred to SDU.  She remained on BiPAP throughout the day.  Later that evening, she was slightly agitated therefore was given 0.5 mg ativan with improvement in agitation / anxiety; however, in early AM hours 4/10, she was found to be unresponsive.  ABG demonstrated worsening hypercarbic respiratory failure (7.25 / 94 / 77) despite high settings on BiPAP (20/6) and Vt noted low in high 100's to low 200's.  She was subsequently transferred to ICU where she required intubation.   PAST MEDICAL HISTORY :  She  has a past medical history of Allergic rhinitis; Anxiety; Aortic stenosis; Asthma; Bronchitis, chronic obstructive, with exacerbation ([place]); Bruises easily; Cataract; Chronic back pain; COPD (chronic obstructive pulmonary disease) (HCC); GERD (gastroesophageal reflux disease); Hemorrhoid; History of bronchitis; History of colon polyps; History of gout; History of staph infection; Hyperlipidemia; Hypertension; Joint pain; Joint swelling; Multiple sclerosis ([place]); Paroxysmal atrial fibrillation ([place]) (11/08); Peripheral edema; Pneumonia; PONV (postoperative  nausea and vomiting); Rheumatoid arthritis(714.0); Shortness of breath; and Stasis dermatitis.  PAST SURGICAL HISTORY: She  has a past surgical history that includes Abdominal hysterectomy; Lumbar disc surgery (2003); Breast cyst excision; Colonoscopy; cataract surgery (Left); Lumbar laminectomy/decompression microdiscectomy (Right, 12/15/2013); and left and right heart catheterization with coronary angiogram (N/A, 07/13/2013).  Allergies  Allergen Reactions  . Amoxicillin-Pot Clavulanate Other (See Comments)    GI upset  . Daliresp [Roflumilast] Other (See Comments)    unknown  . Diltiazem Nausea Only  . Montelukast Sodium Other (See Comments)     flu-like symptoms  . Zafirlukast Other (See Comments)    unknown  . Adhesive [Tape] Rash    Please use paper tape    No current facility-administered medications on file prior to encounter.    Current Outpatient Prescriptions on File Prior to Encounter  Medication Sig  . ANORO ELLIPTA 62.5-25 MCG/INH AEPB INHALE 1 PUFF INTO THE LUNGS DAILY.  Marland Kitchen apixaban (ELIQUIS) 5 MG TABS tablet Take 1 tablet (5 mg total) by mouth 2 (two) times daily.  Marland Kitchen atenolol (TENORMIN) 50 MG tablet Take 1 tablet (50 mg total) by mouth daily.  . budesonide (PULMICORT) 0.25 MG/2ML nebulizer solution USE 1 VIAL IN NEBULIZER TWICE A DAY  . formoterol (PERFOROMIST) 20 MCG/2ML nebulizer solution USE 1 VIAL IN NEBULIZER TWICE A DAY  . gabapentin (NEURONTIN) 300 MG capsule Take 300 mg by mouth 4 (four) times daily.   Marland Kitchen guaiFENesin (MUCINEX) 600 MG 12 hr tablet Take 600 mg by mouth 2 (two) times daily as needed for cough or to loosen phlegm.   . hydrochlorothiazide 25 MG tablet Take 25 mg by mouth  daily.   . ipratropium (ATROVENT) 0.02 % nebulizer solution Take 2.5 mLs (0.5 mg total) by nebulization 3 (three) times daily as needed for wheezing or shortness of breath. DX code J44.9.  Marland Kitchen levalbuterol (XOPENEX) 0.63 MG/3ML nebulizer solution Take 3 mLs (0.63 mg total) by  nebulization every 6 (six) hours as needed for wheezing or shortness of breath.  Marland Kitchen PROAIR HFA 108 (90 BASE) MCG/ACT inhaler INHALE 2 PUFFS BY MOUTH EVERY 6 HOURS AS NEEDED FOR WHEEZING OR SHORTNESS OF BREATH  . simvastatin (ZOCOR) 20 MG tablet Take 20 mg by mouth daily.   . [DISCONTINUED] metoprolol tartrate (LOPRESSOR) 25 MG tablet Take 1 tablet (25 mg total) by mouth 2 (two) times daily.  . [DISCONTINUED] pantoprazole (PROTONIX) 40 MG tablet Take 1 tablet (40 mg total) by mouth daily at 12 noon.    FAMILY HISTORY:  Her indicated that her mother is deceased. She indicated that her father is deceased. She indicated that her brother is deceased. She indicated that her maternal grandmother is deceased. She indicated that her maternal grandfather is deceased. She indicated that her paternal grandmother is deceased. She indicated that her paternal grandfather is deceased.    SOCIAL HISTORY: She  reports that she quit smoking about 27 years ago. Her smoking use included Cigarettes. She has a 25.00 pack-year smoking history. She has never used smokeless tobacco. She reports that she does not drink alcohol or use drugs.  REVIEW OF SYSTEMS:  Unable to obtain as patient is encephalopathic.  SUBJECTIVE:  On BiPAP, unresponsive.  VITAL SIGNS: BP (!) 114/59 (BP Location: Right Arm)   Pulse 95   Temp 98.1 F (36.7 C) (Axillary)   Resp (!) 21   Ht 5\' 2"  (1.575 m)   Wt 54.7 kg (120 lb 11.2 oz)   SpO2 97%   BMI 22.08 kg/m   HEMODYNAMICS:    VENTILATOR SETTINGS: FiO2 (%):  [35 %-100 %] 100 %  INTAKE / OUTPUT: I/O last 3 completed shifts: In: 1191 [P.O.:840; I.V.:3; IV Piggyback:300] Out: 3051 [Urine:3050; Stool:1]   PHYSICAL EXAMINATION: General: Elderly appearing female, unresponsive. Neuro: Unresponsive to verbal or physical stimuli. HEENT: [place]/AT. Sclerae anicteric. BiPAP in place. Cardiovascular: RRR, no M/R/G.  Lungs: Respirations shallow and mildly labored. Poor air movement  bilaterally. Abdomen: BS x 4, soft, NT/ND.  Musculoskeletal: No gross deformities, no edema.  Skin: Intact, warm, no rashes.  LABS:  BMET  Recent Labs Lab 01/13/2017 1232 12/26/16 0254 12/28/16 0533  NA 138 138 136  K 3.4* 4.0 5.0  CL 94* 94* 90*  CO2 34* 33* 37*  BUN 13 18 26*  CREATININE 0.64 0.68 0.63  GLUCOSE 90 254* 200*    Electrolytes  Recent Labs Lab 01/18/2017 1232 12/26/2016 1633 12/26/16 0254 12/28/16 0533  CALCIUM 9.0  --  8.7* 8.8*  MG  --  1.8  --  2.2    CBC  Recent Labs Lab 12/21/2016 1232 12/26/16 0254 12/28/16 0533  WBC 14.1* 9.2 28.6*  HGB 14.0 12.8 14.4  HCT 43.3 39.9 45.3  PLT 229 217 291    Coag's No results for input(s): APTT, INR in the last 168 hours.  Sepsis Markers  Recent Labs Lab 12/28/16 0533  LATICACIDVEN 1.1    ABG  Recent Labs Lab 12/28/16 0020 12/28/16 0536 12/29/16 0202  PHART 7.245* 7.277* 7.252*  PCO2ART 96.2* 81.3* 94.3*  PO2ART 451* 77.1* 77.8*    Liver Enzymes No results for input(s): AST, ALT, ALKPHOS, BILITOT, ALBUMIN in the last 168  hours.  Cardiac Enzymes No results for input(s): TROPONINI, PROBNP in the last 168 hours.  Glucose No results for input(s): GLUCAP in the last 168 hours.  Imaging No results found.   STUDIES:  CXR 4/6 > no acute process. CXR 4/10 >   CULTURES: RVP 4/6 > RSV  ANTIBIOTICS: Ceftriaxone 4/6 > 4/10. Azithro 4/6 > 4/10.   SIGNIFICANT EVENTS: 4/6 > admit. 4/9 > transfer to SDU and started on BiPAP. 4/10 > PCCM consult > transfer to ICU and intubated.  LINES/TUBES: ETT 4/10 >   DISCUSSION: 83 y.o. female with history of COPD (on supplemental oxygen as needed), admitted 4/6 with AECOPD.  Required transfer to SDU 4/9 for increased work of breathing and started on BiPAP. On 4/10, became unresponsive due to hypercarbic respiratory failure; therefore, was transferred to ICU where she was intubated.  ASSESSMENT / PLAN:  PULMONARY A: Acute on chronic  hypercarbic and hypoxic respiratory failure. AECOPD - presumed due to RSV. P:   Transfer to ICU now for intubation. Assess ABG, wean as able. VAP prevention measures. SBT in AM if able. DuoNebs / Levalbuterol. Continue steroids - drop dosing to 40mg  BID. CXR in AM. Hold preadmission budesonide, formoterol, anoro.  CARDIOVASCULAR A:  Hx PAF, HTN, HLD, AS. P:  Monitor hemodynamics. Continue preadmission simvastatin. Hold preadmission atenolol, HCTZ.  RENAL A:   No acute issues. P:   NS @ 75. BMP in AM.  GASTROINTESTINAL A:   GERD. Nutrition. P:   SUP: Pantoprazole. NPO.  HEMATOLOGIC A:   VTE Prophylaxis. P:  SCD's / lovenox. CBC in AM.  INFECTIOUS A:   RSV. AECOPD - felt to be due to RSV but has also been on CAP coverage since admission (4/10 marks day 5). Leukocytosis - presumed due to steroids. P:   Continue abx through today then stop (5 days).  ENDOCRINE A:   No acute issues.   P:   No interventions required.  NEUROLOGIC A:   Acute hypercarbic encephalopathy. P:   Transfer to ICU for intubation as above. Sedation:  Fentanyl gtt / Midazolam PRN. RASS goal: 0 to -1. Daily WUA. D/c alprazolam, ativan, gabapentin, trazodone. Hold preadmission gabapentin.  Family updated: Husband Makai Agostinelli) called on home phone but reached granddaughter Raquel Sarna) and gave her update regarding transfer to ICU for intubation.    Interdisciplinary Family Meeting v Palliative Care Meeting:  Due by: 01/04/17.  CC time: 30 min.   Montey Hora, [place] [facility] Pager: [phone]  or [phone] 12/29/2016, 3:27 AM

## 2016-12-29 NOTE — Progress Notes (Signed)
Called by nursing secondary to lethargy and hypoxia following PRN dose of Ativan for agitation.  O2 was increased by RT  ABG#1 0020 (results are mistimed in the lab view) - 7/245/96.2/451/40.4  Requested to titrate down on O2 secondary to hypercapnia and repeat ABG in one hour  ABG#2 0202- 7.252/94.3/77.8/40.1  Patient still unresponsive to verbal, tactile and painful stimuli likely 2/2 combination of benzos and hypercapnia.  Requested CCM consult for further management.

## 2016-12-29 NOTE — Progress Notes (Signed)
Nutrition Consult / Follow-up  DOCUMENTATION CODES:   Severe malnutrition in context of acute illness/injury  INTERVENTION:    Vital AF 1.2 at 40 ml/h (960 ml per day)   Provides 1152 kcal, 72 gm protein, 779 ml free water daily   Total calorie intake with TF + propofol will be 1279 kcal (105% of estimated needs)  NUTRITION DIAGNOSIS:   Increased nutrient needs related to chronic illness, acute illness as evidenced by percent weight loss.  GOAL:   Patient will meet greater than or equal to 90% of their needs  Unmet  MONITOR:   PO intake, Supplement acceptance, Labs, Weight trends  REASON FOR ASSESSMENT:   Consult Enteral/tube feeding initiation and management  ASSESSMENT:   83 y.o. female with a history of CHF,  PAF,  and COPD who presents the emergency department from her primary care physician's office for shortness of breath. Admitted on 4/6 with COPD exacerbation. Respiratory status worsened and required intubation on 4/10.   Discussed patient with RN at bedside today. Received MD Consult for TF initiation and management. Patient is currently intubated on ventilator support MV: 5.5 L/min Temp (24hrs), Avg:98.7 F (37.1 C), Min:97.8 F (36.6 C), Max:100.3 F (37.9 C)  Propofol: 4.8 ml/hr providing 127 kcal per day from lipid Labs reviewed: sodium 152 (H), phosphorus 2.3 (L) Medications reviewed.  Diet Order:  Diet NPO time specified  Skin:  Reviewed, no issues  Last BM:  4/7  Height:   Ht Readings from Last 1 Encounters:  12/23/2016 5\' 2"  (1.575 m)    Weight:   Wt Readings from Last 1 Encounters:  12/28/16 120 lb 11.2 oz (54.7 kg)    Ideal Body Weight:  50 kg  BMI:  Body mass index is 22.08 kg/m.  Estimated Nutritional Needs:   Kcal:  1220  Protein:  70-85 gm  Fluid:  1.5 L  EDUCATION NEEDS:   Education needs no appropriate at this time  Molli Barrows, [place], [place], [place] Pager [phone] After Hours Pager [phone]

## 2016-12-29 NOTE — Progress Notes (Addendum)
Results for GALILEAH, PIGGEE (MRN [id number]) as of 12/29/2016 00:20  ABG was received and resulted 12/29/2016 at 00:20   Ref. Range 12/28/2016 00:20  Sample type Unknown ARTERIAL DRAW  Delivery systems Unknown BILEVEL POSITIVE .Marland KitchenMarland Kitchen  FIO2 Unknown 100.00  Inspiratory PAP Unknown 12.0  Expiratory PAP Unknown 6.0  pH, Arterial Latest Ref Range: 7.350 - 7.450  7.245 (L)  pCO2 arterial Latest Ref Range: 32.0 - 48.0 mmHg 96.2 (HH)  pO2, Arterial Latest Ref Range: 83.0 - 108.0 mmHg 451 (H)  Acid-Base Excess Latest Ref Range: 0.0 - 2.0 mmol/L 12.8 (H)  Bicarbonate Latest Ref Range: 20.0 - 28.0 mmol/L 40.4 (H)  O2 Saturation Latest Units: % 99.4  Patient temperature Unknown 98.1  Collection site Unknown LEFT RADIAL  Allens test (pass/fail) Latest Ref Range: PASS  PASS

## 2016-12-29 NOTE — Progress Notes (Signed)
[place] Progress Note Patient Name: [name] DOB: 05-Jan-1933 MRN: [id number]   Date of Service  12/29/2016  HPI/Events of Note  Tachy, pain? Not repsonding to pushes  eICU Interventions  Add fent drip     Intervention Category Intermediate Interventions: Respiratory distress - evaluation and management  Orissa Arreaga J. 12/29/2016, 4:45 PM

## 2016-12-29 NOTE — Assessment & Plan Note (Addendum)
Due to RSV infection  Plan Droplet precaution supporrtive care Steroids - will increase solumedrol dose Frrequent scheduled BD Check mag leve and replace if low Check procal and if , 0.25 , dc antibiotics

## 2016-12-29 NOTE — Assessment & Plan Note (Signed)
Severe distress at time of eval in ICU despite bipap since admit  Plan Intubate Ventilate for permissive hypercapnia

## 2016-12-29 NOTE — Assessment & Plan Note (Signed)
Agitated encephalopathy with cofusion present upon MD eval at 73m ICU  Plan Diprivan post intubation RASS goal 0 to -2

## 2016-12-29 NOTE — Procedures (Signed)
Intubation Procedure Note [name] [id number] 07/31/1933  Procedure: Intubation Indications: Respiratory insufficiency  Procedure Details Consent: Unable to obtain consent because of emergent medical necessity. Time Out: Verified patient identification, verified procedure, site/side was marked, verified correct patient position, special equipment/implants available, medications/allergies/relevent history reviewed, required imaging and test results available.  Performed  Maximum sterile technique was used including cap, gloves, gown, hand hygiene and mask.  MAC  glidescope used Easy intubation  fent 140mg Versed 230mEtomidate 2088mocuronium 41m36m Evaluation Hemodynamic Status: Persistent hypotension treated with fluid; O2 sats: stable throughout Patient's Current Condition: stable Complications: No apparent complications Patient did tolerate procedure well. Chest X-ray ordered to verify placement.  CXR: pending.    Dr. MuraBrand MalesD., F.C.[facility] Staff Physician [facility] Pager: [phone] no answer or between  15:00h - 7:00h: call [phone]  12/29/2016 4:10 AM

## 2016-12-29 NOTE — Assessment & Plan Note (Signed)
Post intubation hypotension despite fluid and bicarb  Plan Start neo gtt via piv

## 2016-12-29 NOTE — Progress Notes (Signed)
RT  Note- Bite block placed for biting the ETT.

## 2016-12-29 NOTE — Progress Notes (Signed)
Results for [name], [name] (MRN [id number]) as of 12/29/2016 02:11  Critical ABG results called to RN    Ref. Range 12/29/2016 02:02  Sample type Unknown ARTERIAL DRAW  Delivery systems Unknown BILEVEL POSITIVE .Marland KitchenMarland Kitchen  FIO2 Unknown 40.00  Inspiratory PAP Unknown 20.0  Expiratory PAP Unknown 6.0  pH, Arterial Latest Ref Range: 7.350 - 7.450  7.252 (L)  pCO2 arterial Latest Ref Range: 32.0 - 48.0 mmHg 94.3 (HH)  pO2, Arterial Latest Ref Range: 83.0 - 108.0 mmHg 77.8 (L)  Acid-Base Excess Latest Ref Range: 0.0 - 2.0 mmol/L 12.7 (H)  Bicarbonate Latest Ref Range: 20.0 - 28.0 mmol/L 40.1 (H)  O2 Saturation Latest Units: % 94.7  Patient temperature Unknown 98.6  Collection site Unknown LEFT RADIAL

## 2016-12-29 NOTE — Care Management Note (Signed)
Case Management Note  Patient Details  Name: [name] MRN: [id number] Date of Birth: [birth year]  Subjective/Objective:                 Patient from home with spouse in [place]. Patient admitted with resp failure and AMS. Patient emergently intubated 12/29/16.   Action/Plan:  CM will continue to follow.  Expected Discharge Date:  12/27/16               Expected Discharge Plan:     In-House Referral:     Discharge planning Services  CM Consult  Post Acute Care Choice:    Choice offered to:     DME Arranged:    DME Agency:     HH Arranged:    HH Agency:     Status of Service:  In process, will continue to follow  If discussed at Long Length of Stay Meetings, dates discussed:    Additional Comments:  Carles Collet, RN 12/29/2016, 11:57 AM

## 2016-12-29 NOTE — Progress Notes (Signed)
Patient transported on BIPAP from 4E to 2M11. No complications noted.

## 2016-12-30 ENCOUNTER — Inpatient Hospital Stay (HOSPITAL_COMMUNITY): Payer: Medicare Other

## 2016-12-30 DIAGNOSIS — G9340 Encephalopathy, unspecified: Secondary | ICD-10-CM

## 2016-12-30 LAB — GLUCOSE, CAPILLARY
GLUCOSE-CAPILLARY: 265 mg/dL — AB (ref 65–99)
GLUCOSE-CAPILLARY: 224 mg/dL — AB (ref 65–99)
GLUCOSE-CAPILLARY: 219 mg/dL — AB (ref 65–99)
Glucose-Capillary: 199 mg/dL — ABNORMAL HIGH (ref 65–99)
Glucose-Capillary: 213 mg/dL — ABNORMAL HIGH (ref 65–99)
Glucose-Capillary: 234 mg/dL — ABNORMAL HIGH (ref 65–99)

## 2016-12-30 LAB — PROCALCITONIN: Procalcitonin: <0.1 ng/mL

## 2016-12-30 MED ORDER — METHYLPREDNISOLONE SODIUM SUCC 40 MG IJ SOLR
40.0000 mg | Freq: Two times a day (BID) | INTRAMUSCULAR | Status: DC
  Administered 2016-12-30 – 2017-01-01 (×4): 40 mg via INTRAVENOUS
  Filled 2016-12-30 (×4): qty 1

## 2016-12-30 MED ORDER — VITAL AF 1.2 CAL PO LIQD
1000.0000 mL | ORAL | Status: DC
  Administered 2016-12-30 – 2016-12-31 (×2): 1000 mL

## 2016-12-30 MED ORDER — VITAL HIGH PROTEIN PO LIQD
1000.0000 mL | ORAL | Status: DC
Start: 2016-12-30 — End: 2016-12-30
  Administered 2016-12-30: 1000 mL

## 2016-12-30 MED ORDER — SODIUM CHLORIDE 0.9 % IV SOLN
0.0000 mg/h | INTRAVENOUS | Status: DC
  Administered 2016-12-30 – 2017-01-01 (×3): 2 mg/h via INTRAVENOUS
  Filled 2016-12-30 (×4): qty 10

## 2016-12-30 MED ORDER — SODIUM CHLORIDE 0.9 % IV SOLN: INTRAVENOUS | Status: DC

## 2016-12-30 MED ORDER — LORAZEPAM 2 MG/ML IJ SOLN
INTRAMUSCULAR | Status: AC
  Administered 2016-12-30: 2 mg
  Filled 2016-12-30: qty 1

## 2016-12-30 MED ORDER — SODIUM CHLORIDE 0.9 % IV SOLN
0.4000 ug/kg/h | INTRAVENOUS | Status: DC
  Administered 2016-12-30: 0.4 ug/kg/h via INTRAVENOUS
  Filled 2016-12-30 (×2): qty 2

## 2016-12-30 MED ORDER — CHLORHEXIDINE GLUCONATE 0.12 % MT SOLN: 15.0000 mL | Freq: Two times a day (BID) | OROMUCOSAL | Status: DC

## 2016-12-30 MED ORDER — CHLORHEXIDINE GLUCONATE 0.12 % MT SOLN
15.0000 mL | Freq: Two times a day (BID) | OROMUCOSAL | Status: DC
  Administered 2016-12-30 (×2): 15 mL via OROMUCOSAL

## 2016-12-30 MED ORDER — LORAZEPAM 2 MG/ML IJ SOLN: 2.0000 mg | Freq: Once | INTRAMUSCULAR | Status: AC

## 2016-12-30 MED ORDER — MIDAZOLAM BOLUS VIA INFUSION
1.0000 mg | INTRAVENOUS | Status: DC | PRN
Start: 2016-12-30 — End: 2017-01-01
  Administered 2016-12-31 – 2017-01-01 (×4): 2 mg via INTRAVENOUS
  Filled 2016-12-30: qty 2

## 2016-12-30 NOTE — Progress Notes (Signed)
eLink Physician-Brief Progress Note Patient Name: [name] DOB: 1932-10-30 MRN: [id number]   Date of Service  12/30/2016  HPI/Events of Note  Severe Agitation Failing, precedex, fent, int versed  eICU Interventions  Versed drip     Intervention Category Major Interventions: Delirium, psychosis, severe agitation - evaluation and management  Raylene Miyamoto. 12/30/2016, 4:27 PM

## 2016-12-30 NOTE — Progress Notes (Signed)
ATTENDING NOTE: I have personally reviewed patient's available data, including medical history, events of note, physical examination and test results as part of my evaluation. I have discussed with resident/NP and other careteam providers such as pharmacist, RN and RRT & co-ordinated with consultants. In addition, I personally evaluated patient and elicited key history of   83 year old with severe COPD on home oxygen intubated 4/9 for acute hypercarbic respiratory failure, admitted for/6 for COPD exacerbation, respiratory viral panel shows RSV  On exam- intubated, sedated, decreased breath sounds bilateral without rhonchi, S1-S2 tachycardia, no edema  Chest x-ray does not show any infiltrates or effusions. Labs showed low pro-calcitonin, high bicarbonate ABG shows compensated respiratory acidosis  Impression/plan  Acute hypercarbic respiratory failure due to COPD exacerbation -Bronchospasm appears to have resolved, can decrease Solu-Medrol to 40 every 12 Continue bronchodilators Start spontaneous breathing trials with goal extubation Try to avoid respiratory alkalosis him for PCO2 in the 70 range Can discontinue antibiotics since no bacterial etiology found  Steroid-induced hyperglycemia-decrease steroids dosing and uses SSI resistant scale, can add low-dose Lantus if needed   Prognosis guarded but hopeful to extubate, may need BiPAP on extubation Rest per medical resident whose note is outlined above and that I agree with and edited in full.    The patient is critically ill with multiple organ systems failure and requires high complexity decision making for assessment and support, frequent evaluation and titration of therapies, application of advanced monitoring technologies and extensive interpretation of multiple databases. Critical Care Time devoted to patient care services described in this note independent of resident time is 32 minutes.   Rigoberto Noel MD

## 2016-12-30 NOTE — Progress Notes (Signed)
PULMONARY  / CRITICAL CARE MEDICINE  Name: [name] MRN: [id number] DOB: [birth year]    LOS: 63  REFERRING MD :  Ree Kida   CHIEF COMPLAINT:  AMS   BRIEF PATIENT DESCRIPTION:  Pt is encephelopathic; therefore, this HPI is obtained from chart review. [name] is a 83 y.o. female with PMH as outlined below including COPD for which she uses supplemental O2 as needed. She went to her PCP on 01/15/2017 for shortness of breath and increased work of breathing and while there, was found to have SPO2 in the mid 80s. She was subsequently sent to the ED for further evaluation. In ED, she was felt to have AECOPD and was subsequently admitted by hospitalist for further management.  RVP noted positive for RSV.  On 4/9, she had worsening in her work of breathing; therefore, she was started on BiPAP and was transferred to SDU.  She remained on BiPAP throughout the day.  Later that evening, she was slightly agitated therefore was given 0.5 mg ativan with improvement in agitation / anxiety; however, in early AM hours 4/10, she was found to be unresponsive.  ABG demonstrated worsening hypercarbic respiratory failure (7.25 / 94 / 77) despite high settings on BiPAP (20/6) and Vt noted low in high 100's to low 200's.  She was subsequently transferred to ICU where she required intubation.  LINES / TUBES: NG/OG tube 4/10>>  Urethral catheter 4/10>>  Airway 4/10>> Peripheral IV left wrist, right forearm, right forearm 4/10 >>   CULTURES: MRSA nasal swab positive  Respiratory culture 4/11 >> in process   ANTIBIOTICS: Azithromycin 4/7 -4/10 Ceftriaxone 4/6- 4/10  SIGNIFICANT EVENTS:  4/6 > admit. 4/9 > transfer to SDU and started on BiPAP. 4/10 > PCCM consult > transfer to ICU and intubated. 4/11 > pulmonary exam improving, d/c steroids and abx, spontaneous weaning trial   LEVEL OF CARE:  Critical care  PRIMARY SERVICE:  PCCM  CONSULTANTS:  None  CODE STATUS FULL  DIET:  Tube feeds  DVT Px:   lovenox  GI Px:  protonix   PAST MEDICAL HISTORY :  Past Medical History:  Diagnosis Date  . Allergic rhinitis    takes Mucinex daily  . Anxiety    takes Alprazolam daily  . Aortic stenosis    mild by echo (2008)  . Asthma    uses Albuterol daily as needed. Pulmicort and Perforomist neb daily  . Bronchitis, chronic obstructive, with exacerbation ([place])   . Bruises easily   . Cataract    right  . Chronic back pain    HNP  . COPD (chronic obstructive pulmonary disease) (HCC)    Xopenex and Atrobent neb daily   . GERD (gastroesophageal reflux disease)    takes Omeprazole daily  . Hemorrhoid   . History of bronchitis    many yrs ago  . History of colon polyps   . History of gout    no meds required  . History of staph infection   . Hyperlipidemia    takes Simvastatin daily  . Hypertension    takes Losartan daily  . Joint pain   . Joint swelling   . Multiple sclerosis ([place])   . Paroxysmal atrial fibrillation ([place]) 11/08  . Peripheral edema   . Pneumonia    hx of-as a baby  . PONV (postoperative nausea and vomiting)   . Rheumatoid arthritis(714.0)   . Shortness of breath    with exertion  . Stasis dermatitis  Past Surgical History:  Procedure Laterality Date  . ABDOMINAL HYSTERECTOMY    . BREAST CYST EXCISION    . cataract surgery Left   . COLONOSCOPY    . LEFT AND RIGHT HEART CATHETERIZATION WITH CORONARY ANGIOGRAM N/A 07/13/2013   Procedure: LEFT AND RIGHT HEART CATHETERIZATION WITH CORONARY ANGIOGRAM;  Surgeon: Blane Ohara, MD;  Location: [facility] CATH LAB;  Service: Cardiovascular;  Laterality: N/A;  . LUMBAR DISC SURGERY  2003   L5  . LUMBAR LAMINECTOMY/DECOMPRESSION MICRODISCECTOMY Right 12/15/2013   Procedure: LUMBAR LAMINECTOMY/DECOMPRESSION MICRODISCECTOMY 1 LEVEL two/three;  Surgeon: Winfield Cunas, MD;  Location: [facility];  Service: Neurosurgery;  Laterality: Right;  Right L23 microdiskectomy   Prior to Admission medications   Medication Sig Start  Date End Date Taking? Authorizing Provider  ANORO ELLIPTA 62.5-25 MCG/INH AEPB INHALE 1 PUFF INTO THE LUNGS DAILY. 12/07/16  Yes Deneise Lever, MD  apixaban (ELIQUIS) 5 MG TABS tablet Take 1 tablet (5 mg total) by mouth 2 (two) times daily. 12/15/16  Yes Virgel Manifold, MD  atenolol (TENORMIN) 50 MG tablet Take 1 tablet (50 mg total) by mouth daily. 12/15/16  Yes Virgel Manifold, MD  budesonide (PULMICORT) 0.25 MG/2ML nebulizer solution USE 1 VIAL IN NEBULIZER TWICE A DAY 12/31/15  Yes Clinton D Young, MD  formoterol (PERFOROMIST) 20 MCG/2ML nebulizer solution USE 1 VIAL IN NEBULIZER TWICE A DAY 08/21/16  Yes Deneise Lever, MD  gabapentin (NEURONTIN) 300 MG capsule Take 300 mg by mouth 4 (four) times daily.  01/19/16  Yes Historical Provider, MD  guaiFENesin (MUCINEX) 600 MG 12 hr tablet Take 600 mg by mouth 2 (two) times daily as needed for cough or to loosen phlegm.    Yes Historical Provider, MD  hydrochlorothiazide 25 MG tablet Take 25 mg by mouth daily.    Yes Historical Provider, MD  ipratropium (ATROVENT) 0.02 % nebulizer solution Take 2.5 mLs (0.5 mg total) by nebulization 3 (three) times daily as needed for wheezing or shortness of breath. DX code J44.9. 08/19/16  Yes Deneise Lever, MD  levalbuterol (XOPENEX) 0.63 MG/3ML nebulizer solution Take 3 mLs (0.63 mg total) by nebulization every 6 (six) hours as needed for wheezing or shortness of breath. 06/30/16  Yes Deneise Lever, MD  omeprazole (PRILOSEC) 20 MG capsule Take 20 mg by mouth daily.   Yes Historical Provider, MD  PROAIR HFA 108 (90 BASE) MCG/ACT inhaler INHALE 2 PUFFS BY MOUTH EVERY 6 HOURS AS NEEDED FOR WHEEZING OR SHORTNESS OF BREATH 11/15/14  Yes Deneise Lever, MD  simvastatin (ZOCOR) 20 MG tablet Take 20 mg by mouth daily.    Yes Historical Provider, MD   Allergies  Allergen Reactions  . Amoxicillin-Pot Clavulanate Other (See Comments)    GI upset  . Daliresp [Roflumilast] Other (See Comments)    unknown  . Diltiazem Nausea  Only  . Montelukast Sodium Other (See Comments)     flu-like symptoms  . Zafirlukast Other (See Comments)    unknown  . Adhesive [Tape] Rash    Please use paper tape    FAMILY HISTORY:  Family History  Problem Relation Age of Onset  . Asthma Mother   . Emphysema Father   . Alzheimer's disease Brother   . Colon cancer Other     sibling  . Heart disease Other     sibling   SOCIAL HISTORY:  reports that she quit smoking about 27 years ago. Her smoking use included Cigarettes. She has a 25.00  pack-year smoking history. She has never used smokeless tobacco. She reports that she does not drink alcohol or use drugs.  REVIEW OF SYSTEMS:  Complete review of systems could not be obtained as pt is currently sedated and encephalopathic    INTERVAL HISTORY:   VITAL SIGNS: Temp:  [98.1 F (36.7 C)-100.1 F (37.8 C)] 98.8 F (37.1 C) (04/11 1152) Pulse Rate:  [86-129] 86 (04/11 1300) Resp:  [9-22] 11 (04/11 1300) BP: (71-152)/(52-72) 97/62 (04/11 1300) SpO2:  [93 %-97 %] 96 % (04/11 1300) FiO2 (%):  [40 %] 40 % (04/11 1200) Weight:  [53.8 kg (118 lb 9.7 oz)] 53.8 kg (118 lb 9.7 oz) (04/11 0500) HEMODYNAMICS:   VENTILATOR SETTINGS: Vent Mode: PRVC FiO2 (%):  [40 %] 40 % Set Rate:  [12 bmp] 12 bmp Vt Set:  [450 mL] 450 mL PEEP:  [5 cmH20] 5 cmH20 Plateau Pressure:  [20 WUX32-44 cmH20] 20 cmH20 INTAKE / OUTPUT: Intake/Output      04/10 0701 - 04/11 0700 04/11 0701 - 04/12 0700   P.O.     I.V. (mL/kg) 152.8 (2.8) 138.9 (2.6)   Other 230    NG/GT 659.3 270   IV Piggyback 50    Total Intake(mL/kg) 1092.1 (20.3) 408.9 (7.6)   Urine (mL/kg/hr) 660 (0.5) 160 (0.5)   Stool     Total Output 660 160   Net +432.1 +248.9          PHYSICAL EXAMINATION: General:  Agitated, well nourished  Neuro: Sedated, agitated  HEENT: Intubated, MMM  Cardiovascular: RRR, no murmur  Lungs: intubated, mild bilateral wheezing, no rhonchi  Abdomen:  NG tube in place, bs+, soft, non  distended Musculoskeletal: moving all extremities, SCDs in place  Skin:  Bruising over left arm   LABS: Cbc  Recent Labs Lab 12/28/16 0533 12/29/16 0241 12/29/16 0423  WBC 28.6* 20.5* 13.7*  HGB 14.4 14.6 13.3  HCT 45.3 46.5* 42.3  PLT 291 212 180    Chemistry   Recent Labs Lab 12/24/2016 1633  12/28/16 0533 12/29/16 0241 12/29/16 0423 12/29/16 1219  NA  --   < > 136 136 152* 139  K  --   < > 5.0 4.9 4.6 4.0  CL  --   < > 90* 90* 91* 93*  CO2  --   < > 37* 39* >50* 36*  BUN  --   < > 26* 26* 26* 28*  CREATININE  --   < > 0.63 0.44 0.54 0.64  CALCIUM  --   < > 8.8* 8.7* 8.1* 8.1*  MG 1.8  --  2.2  --  2.4  --   PHOS  --   --   --   --  2.3*  --   GLUCOSE  --   < > 200* 162* 171* 181*  < > = values in this interval not displayed.  Liver fxn No results for input(s): AST, ALT, ALKPHOS, BILITOT, PROT, ALBUMIN in the last 168 hours. coags No results for input(s): APTT, INR in the last 168 hours. Sepsis markers  Recent Labs Lab 12/28/16 0533 12/29/16 0423 12/30/16 0215  LATICACIDVEN 1.1  --   --   PROCALCITON  --  0.13 <0.10   Cardiac markers  Recent Labs Lab 12/29/16 0423 12/29/16 1219 12/29/16 1932  TROPONINI 0.05* 0.04* 0.06*   BNP No results for input(s): PROBNP in the last 168 hours. ABG  Recent Labs Lab 01/14/2017 1512  12/28/16 0536 12/29/16 0202 12/29/16 0513  PHART  --   < >  7.277* 7.252* 7.402  PCO2ART  --   < > 81.3* 94.3* 69.9*  PO2ART  --   < > 77.1* 77.8* 89.0  HCO3 37.6*  < > 36.7* 40.1* 43.6*  TCO2 39  --   --   --  46  < > = values in this interval not displayed.  CBG trend  Recent Labs Lab 12/29/16 2015 12/29/16 2351 12/30/16 0423 12/30/16 0818 12/30/16 1149  GLUCAP 304* 277* 265* 224* 213*    IMAGING:  ECG:  DIAGNOSES: Principal Problem:   Acute on chronic respiratory failure with hypoxia and hypercapnia (HCC) Active Problems:   Essential hypertension   GERD   PAF (paroxysmal atrial fibrillation) (HCC)    Dyslipidemia   Acute exacerbation of chronic obstructive pulmonary disease (COPD) (HCC)   CHF (congestive heart failure) (HCC)   Protein-calorie malnutrition, severe   Hypotension (arterial)   Encephalopathy acute   ASSESSMENT / PLAN:  PULMONARY  ASSESSMENT: Acute COPD exacerbation secondary to viral infection with RSV, failed BiPAP Hypercapnic respiratory failure  ABG shows appropriate improvement in CO2 to 70, trying to avoid respiratory alkalosis  On exam her wheezing has improved, will taper steroids  PLAN:   On pressure support volume control ventilation, spontaneous breathing trial today  Duoneb q4h scheduled  Solumedrol 60 mg q8h, decrease to 40 mg BID today  Follow up respiratory culture  Follow up chest xray tomorrow   CARDIOVASCULAR  ASSESSMENT:  Normotensive  Tachycardic overnight, possibly secondary to pain?  Hx of paroxysmal atrial fibrillation, hyperlipidemia, aortic stenosis  PLAN:  Home med is atenolol 50mg  qd, continue Metoprolol 2.5 q6h Continue home med Simvastatin   Cardiac monitor   RENAL  ASSESSMENT:  No acute issues  PLAN:   Follow up BMP tomorrow   GASTROINTESTINAL  ASSESSMENT:   GERD  PLAN:   Continue tube feeds  protonix   HEMATOLOGIC  ASSESSMENT:  VTE prophylaxis  PLAN: SCD's and Lovenox  Follow up CBC   INFECTIOUS  ASSESSMENT:   RSV with secondary COPD exacerbation, has received 6 days of ceftriaxone and azithromycin for CAP coverage, will D/C today   Tmax over last 24 hours was 100.3.   PLAN:   Follow up respiratory culture  Discontinue ceftriaxone and azithromycin   ENDOCRINE  ASSESSMENT:   Hyperglycemia 2/2 steroids, decreasing solumedrol today  PLAN:   Sliding scale insulin  CBG q4h  NEUROLOGIC  ASSESSMENT:  Encephalopathy with confusion and aggitation  PLAN:   RASS goal 0 to -2 Start precedex gtt today  Discontinue fentanyl gtt and propofol today  Fentanyl 50-200 mg q1h PRN breakthrough pain  Continue  restraints for patient safety   CLINICAL SUMMARY: Pt with COPD exacerbation secondary to RSV infection with improved pulmonary exam today. Spontaneous weaning trial today, goal is to take her off vent. Pt remains aggitated D/c abx and tapering steroids. Monitoring intermittent fevers.   I have personally obtained a history, examined the patient, evaluated laboratory and imaging results, formulated the assessment and plan and placed orders. CRITICAL CARE: The patient is critically ill with multiple organ systems failure and requires high complexity decision making for assessment and support, frequent evaluation and titration of therapies, application of advanced monitoring technologies and extensive interpretation of multiple databases. Critical Care Time devoted to patient care services described in this note is 30 minutes.    Pulmonary and [place] Pager: [phone]  12/30/2016, 1:36 PM

## 2016-12-30 NOTE — Progress Notes (Signed)
Nutrition Consult / Follow-up  DOCUMENTATION CODES:   Severe malnutrition in context of acute illness/injury  INTERVENTION:    Vital AF 1.2 at 40 ml/h (960 ml per day)   Provides 1152 kcal, 72 gm protein, 779 ml free water daily  NUTRITION DIAGNOSIS:   Increased nutrient needs related to chronic illness, acute illness as evidenced by percent weight loss.  GOAL:   Patient will meet greater than or equal to 90% of their needs  Unmet  MONITOR:   PO intake, Supplement acceptance, Labs, Weight trends  REASON FOR ASSESSMENT:   Consult Enteral/tube feeding initiation and management  ASSESSMENT:   83 y.o. female with a history of CHF,  PAF,  and COPD who presents the emergency department from her primary care physician's office for shortness of breath. Admitted on 4/6 with COPD exacerbation. Respiratory status worsened and required intubation on 4/10.   Discussed patient with RN at bedside today. Propofol has been discontinued. Received MD Consult for TF initiation and management. Patient is currently intubated on ventilator support MV: 5.5 L/min Temp (24hrs), Avg:99.1 F (37.3 C), Min:98.1 F (36.7 C), Max:100.1 F (37.8 C)  Propofol: off Labs and medications reviewed.  Diet Order:  Diet NPO time specified  Skin:  Reviewed, no issues  Last BM:  4/7  Height:   Ht Readings from Last 1 Encounters:  01/12/2017 5\' 2"  (1.575 m)    Weight:   Wt Readings from Last 1 Encounters:  12/30/16 118 lb 9.7 oz (53.8 kg)    Ideal Body Weight:  50 kg  BMI:  Body mass index is 21.69 kg/m.  Estimated Nutritional Needs:   Kcal:  1220  Protein:  70-85 gm  Fluid:  1.5 L  EDUCATION NEEDS:   Education needs no appropriate at this time  Molli Barrows, [place], [place], [place] Pager [phone] After Hours Pager [phone]

## 2016-12-31 ENCOUNTER — Inpatient Hospital Stay (HOSPITAL_COMMUNITY): Payer: Medicare Other

## 2016-12-31 DIAGNOSIS — I5020 Unspecified systolic (congestive) heart failure: Secondary | ICD-10-CM

## 2016-12-31 LAB — GLUCOSE, CAPILLARY
GLUCOSE-CAPILLARY: 224 mg/dL — AB (ref 65–99)
GLUCOSE-CAPILLARY: 195 mg/dL — AB (ref 65–99)
GLUCOSE-CAPILLARY: 223 mg/dL — AB (ref 65–99)
GLUCOSE-CAPILLARY: 143 mg/dL — AB (ref 65–99)
Glucose-Capillary: 225 mg/dL — ABNORMAL HIGH (ref 65–99)
Glucose-Capillary: 227 mg/dL — ABNORMAL HIGH (ref 65–99)

## 2016-12-31 LAB — BASIC METABOLIC PANEL
ANION GAP: 5 (ref 5–15)
BUN: 29 mg/dL — AB (ref 6–20)
CALCIUM: 8.2 mg/dL — AB (ref 8.9–10.3)
CO2: 38 mmol/L — AB (ref 22–32)
CREATININE: 0.47 mg/dL (ref 0.44–1.00)
Chloride: 97 mmol/L — ABNORMAL LOW (ref 101–111)
GFR calc Af Amer: >60 mL/min (ref >60)
GLUCOSE: 240 mg/dL — AB (ref 65–99)
Potassium: 4 mmol/L (ref 3.5–5.1)
Sodium: 140 mmol/L (ref 135–145)

## 2016-12-31 MED ORDER — ORAL CARE MOUTH RINSE
15.0000 mL | Freq: Four times a day (QID) | OROMUCOSAL | Status: DC
  Administered 2016-12-31 – 2017-01-01 (×5): 15 mL via OROMUCOSAL

## 2016-12-31 MED ORDER — CHLORHEXIDINE GLUCONATE 0.12% ORAL RINSE (MEDLINE KIT)
15.0000 mL | Freq: Two times a day (BID) | OROMUCOSAL | Status: DC
  Administered 2016-12-31 – 2017-01-01 (×4): 15 mL via OROMUCOSAL

## 2016-12-31 MED ORDER — SENNOSIDES 8.8 MG/5ML PO SYRP
5.0000 mL | ORAL_SOLUTION | Freq: Two times a day (BID) | ORAL | Status: DC
  Administered 2016-12-31 – 2017-01-01 (×3): 5 mL
  Filled 2016-12-31 (×4): qty 5

## 2016-12-31 MED ORDER — INSULIN ASPART 100 UNIT/ML ~~LOC~~ SOLN
0.0000 [IU] | Freq: Three times a day (TID) | SUBCUTANEOUS | Status: DC
Start: 2016-12-31 — End: 2017-01-01
  Administered 2016-12-31 (×2): 5 [IU] via SUBCUTANEOUS

## 2016-12-31 NOTE — Progress Notes (Signed)
[facility] PCCM Attending:  I have seen and examined the patient with nurse practitioner/resident and agree with the note above.  We formulated the plan together and I elicited the following history.    Date of admission:01/18/2017 12:24 PM  Admitted for RSV related acute respiratory failure, baseline COPD, Afib Agitation yesterday, sedation changed to versed gtt in addition to fentanyl gtt, was on precedex (didn't help) Fever yesterday 100.3  Vitals:   12/31/16 0857 12/31/16 0900 12/31/16 0905 12/31/16 1000  BP:  99/66  132/73  Pulse:  92  96  Resp:  11  11  Temp:      TempSrc:      SpO2: 96% 98% 98% 95%  Weight:      Height:       Vent Mode: PRVC FiO2 (%):  [40 %] 40 % Set Rate:  [12 bmp] 12 bmp Vt Set:  [450 mL] 450 mL PEEP:  [5 cmH20] 5 cmH20 Plateau Pressure:  [15 cmH20-26 cmH20] 15 cmH20   Intake/Output Summary (Last 24 hours) at 12/31/16 1044 Last data filed at 12/31/16 0600  Gross per 24 hour  Intake           870.87 ml  Output              525 ml  Net           345.87 ml    Gen: agitated on vent PULM: poor air movement, wheezing, vent supported breathing CV: RRR, systolic murmur  Acute encephalopathy: could her agitation be due to high dose steroids? Need to wean benzo's if able Acute respiratory failure with hypoxemia: unlikely to recover, have discussed with family, they are considering withdrawal of care, continue bronchodilators for now, continue systemic steroids  My cc time 5 mintues  [name], MD [place] PCCM Pager: [phone] Cell: [phone] After 3pm or if no response, call [phone]

## 2016-12-31 NOTE — Progress Notes (Signed)
PULMONARY  / CRITICAL CARE MEDICINE  Name: [name] MRN: [id number] DOB: November 05, 1932    LOS: 31  REFERRING MD :  Ree Kida   CHIEF COMPLAINT:  AMS   BRIEF PATIENT DESCRIPTION:  Pt is encephelopathic; therefore, this HPI is obtained from chart review. [name] is a 83 y.o. female with PMH as outlined below including COPD for which she uses supplemental O2 as needed. She went to her PCP on 12/26/2016 for shortness of breath and increased work of breathing and while there, was found to have SPO2 in the mid 80s. She was subsequently sent to the ED for further evaluation. In ED, she was felt to have AECOPD and was subsequently admitted by hospitalist for further management.  RVP noted positive for RSV.  On 4/9, she had worsening in her work of breathing; therefore, she was started on BiPAP and was transferred to SDU.  She remained on BiPAP throughout the day.  Later that evening, she was slightly agitated therefore was given 0.5 mg ativan with improvement in agitation / anxiety; however, in early AM hours 4/10, she was found to be unresponsive.  ABG demonstrated worsening hypercarbic respiratory failure (7.25 / 94 / 77) despite high settings on BiPAP (20/6) and Vt noted low in high 100's to low 200's.  She was subsequently transferred to ICU where she required intubation.  LINES / TUBES: NG/OG tube 4/10>>  Urethral catheter 4/10>>  Airway 4/10>> Peripheral IV left wrist, right forearm, right forearm 4/10 >>   CULTURES: MRSA nasal swab positive  Respiratory culture 4/11 >> in process   ANTIBIOTICS: Azithromycin 4/7 -4/10 Ceftriaxone 4/6- 4/10  SIGNIFICANT EVENTS:  4/6 > admit. 4/9 > transfer to SDU and started on BiPAP. 4/10 > PCCM consult > transfer to ICU and intubated. 4/11 > pulmonary exam improving, d/c steroids and abx, spontaneous weaning trial   LEVEL OF CARE:  Critical care  PRIMARY SERVICE:  PCCM  CONSULTANTS:  None  CODE STATUS FULL  DIET:  Tube feeds  DVT Px:   lovenox  GI Px:  protonix   PAST MEDICAL HISTORY :  Past Medical History:  Diagnosis Date  . Allergic rhinitis    takes Mucinex daily  . Anxiety    takes Alprazolam daily  . Aortic stenosis    mild by echo (2008)  . Asthma    uses Albuterol daily as needed. Pulmicort and Perforomist neb daily  . Bronchitis, chronic obstructive, with exacerbation ([place])   . Bruises easily   . Cataract    right  . Chronic back pain    HNP  . COPD (chronic obstructive pulmonary disease) (HCC)    Xopenex and Atrobent neb daily   . GERD (gastroesophageal reflux disease)    takes Omeprazole daily  . Hemorrhoid   . History of bronchitis    many yrs ago  . History of colon polyps   . History of gout    no meds required  . History of staph infection   . Hyperlipidemia    takes Simvastatin daily  . Hypertension    takes Losartan daily  . Joint pain   . Joint swelling   . Multiple sclerosis ([place])   . Paroxysmal atrial fibrillation ([place]) 11/08  . Peripheral edema   . Pneumonia    hx of-as a baby  . PONV (postoperative nausea and vomiting)   . Rheumatoid arthritis(714.0)   . Shortness of breath    with exertion  . Stasis dermatitis  Past Surgical History:  Procedure Laterality Date  . ABDOMINAL HYSTERECTOMY    . BREAST CYST EXCISION    . cataract surgery Left   . COLONOSCOPY    . LEFT AND RIGHT HEART CATHETERIZATION WITH CORONARY ANGIOGRAM N/A 07/13/2013   Procedure: LEFT AND RIGHT HEART CATHETERIZATION WITH CORONARY ANGIOGRAM;  Surgeon: Blane Ohara, MD;  Location: [facility] CATH LAB;  Service: Cardiovascular;  Laterality: N/A;  . LUMBAR DISC SURGERY  2003   L5  . LUMBAR LAMINECTOMY/DECOMPRESSION MICRODISCECTOMY Right 12/15/2013   Procedure: LUMBAR LAMINECTOMY/DECOMPRESSION MICRODISCECTOMY 1 LEVEL two/three;  Surgeon: Winfield Cunas, MD;  Location: [facility];  Service: Neurosurgery;  Laterality: Right;  Right L23 microdiskectomy   Prior to Admission medications   Medication Sig Start  Date End Date Taking? Authorizing Provider  ANORO ELLIPTA 62.5-25 MCG/INH AEPB INHALE 1 PUFF INTO THE LUNGS DAILY. 12/07/16  Yes Deneise Lever, MD  apixaban (ELIQUIS) 5 MG TABS tablet Take 1 tablet (5 mg total) by mouth 2 (two) times daily. 12/15/16  Yes Virgel Manifold, MD  atenolol (TENORMIN) 50 MG tablet Take 1 tablet (50 mg total) by mouth daily. 12/15/16  Yes Virgel Manifold, MD  budesonide (PULMICORT) 0.25 MG/2ML nebulizer solution USE 1 VIAL IN NEBULIZER TWICE A DAY 12/31/15  Yes Clinton D Young, MD  formoterol (PERFOROMIST) 20 MCG/2ML nebulizer solution USE 1 VIAL IN NEBULIZER TWICE A DAY 08/21/16  Yes Deneise Lever, MD  gabapentin (NEURONTIN) 300 MG capsule Take 300 mg by mouth 4 (four) times daily.  01/19/16  Yes Historical Provider, MD  guaiFENesin (MUCINEX) 600 MG 12 hr tablet Take 600 mg by mouth 2 (two) times daily as needed for cough or to loosen phlegm.    Yes Historical Provider, MD  hydrochlorothiazide 25 MG tablet Take 25 mg by mouth daily.    Yes Historical Provider, MD  ipratropium (ATROVENT) 0.02 % nebulizer solution Take 2.5 mLs (0.5 mg total) by nebulization 3 (three) times daily as needed for wheezing or shortness of breath. DX code J44.9. 08/19/16  Yes Deneise Lever, MD  levalbuterol (XOPENEX) 0.63 MG/3ML nebulizer solution Take 3 mLs (0.63 mg total) by nebulization every 6 (six) hours as needed for wheezing or shortness of breath. 06/30/16  Yes Deneise Lever, MD  omeprazole (PRILOSEC) 20 MG capsule Take 20 mg by mouth daily.   Yes Historical Provider, MD  PROAIR HFA 108 (90 BASE) MCG/ACT inhaler INHALE 2 PUFFS BY MOUTH EVERY 6 HOURS AS NEEDED FOR WHEEZING OR SHORTNESS OF BREATH 11/15/14  Yes Deneise Lever, MD  simvastatin (ZOCOR) 20 MG tablet Take 20 mg by mouth daily.    Yes Historical Provider, MD   Allergies  Allergen Reactions  . Amoxicillin-Pot Clavulanate Other (See Comments)    GI upset  . Daliresp [Roflumilast] Other (See Comments)    unknown  . Diltiazem Nausea  Only  . Montelukast Sodium Other (See Comments)     flu-like symptoms  . Zafirlukast Other (See Comments)    unknown  . Adhesive [Tape] Rash    Please use paper tape    FAMILY HISTORY:  Family History  Problem Relation Age of Onset  . Asthma Mother   . Emphysema Father   . Alzheimer's disease Brother   . Colon cancer Other     sibling  . Heart disease Other     sibling   SOCIAL HISTORY:  reports that she quit smoking about 27 years ago. Her smoking use included Cigarettes. She has a 25.00  pack-year smoking history. She has never used smokeless tobacco. She reports that she does not drink alcohol or use drugs.  REVIEW OF SYSTEMS:  Complete review of systems could not be obtained as pt is currently sedated and encephalopathic    INTERVAL HISTORY:   VITAL SIGNS: Temp:  [97.8 F (36.6 C)-99 F (37.2 C)] 98.1 F (36.7 C) (04/12 0333) Pulse Rate:  [86-109] 101 (04/12 0400) Resp:  [9-19] 11 (04/12 0400) BP: (85-152)/(54-75) 103/65 (04/12 0400) SpO2:  [92 %-98 %] 96 % (04/12 0400) FiO2 (%):  [40 %] 40 % (04/12 0400) Weight:  [53.8 kg (118 lb 9.7 oz)] 53.8 kg (118 lb 9.7 oz) (04/11 0500) HEMODYNAMICS:   VENTILATOR SETTINGS: Vent Mode: PRVC FiO2 (%):  [40 %] 40 % Set Rate:  [12 bmp] 12 bmp Vt Set:  [450 mL] 450 mL PEEP:  [5 cmH20] 5 cmH20 Plateau Pressure:  [15 cmH20-26 cmH20] 15 cmH20 INTAKE / OUTPUT: Intake/Output      04/11 0701 - 04/12 0700   I.V. (mL/kg) 440.9 (8.2)   NG/GT 618.7   Total Intake(mL/kg) 1059.6 (19.7)   Urine (mL/kg/hr) 515 (0.4)   Total Output 515   Net +544.6         PHYSICAL EXAMINATION: General:  Sedated. well nourished  Neuro: Sedated, not responsive, pupils constricted but reactive  HEENT: Intubated, blood tinged moist mucous membranes  Cardiovascular: RRR, no murmur  Lungs: intubated, diffuse moderate wheezing over anterior lung fields   Abdomen:  NG tube in place, bs+, soft, non distended Musculoskeletal: moving all extremities, SCDs  in place  Skin:  Bruising over left and right arm   LABS: Cbc  Recent Labs Lab 12/28/16 0533 12/29/16 0241 12/29/16 0423  WBC 28.6* 20.5* 13.7*  HGB 14.4 14.6 13.3  HCT 45.3 46.5* 42.3  PLT 291 212 180    Chemistry   Recent Labs Lab 01/02/2017 1633  12/28/16 0533  12/29/16 0423 12/29/16 1219 12/31/16 0235  NA  --   < > 136  < > 152* 139 140  K  --   < > 5.0  < > 4.6 4.0 4.0  CL  --   < > 90*  < > 91* 93* 97*  CO2  --   < > 37*  < > >50* 36* 38*  BUN  --   < > 26*  < > 26* 28* 29*  CREATININE  --   < > 0.63  < > 0.54 0.64 0.47  CALCIUM  --   < > 8.8*  < > 8.1* 8.1* 8.2*  MG 1.8  --  2.2  --  2.4  --   --   PHOS  --   --   --   --  2.3*  --   --   GLUCOSE  --   < > 200*  < > 171* 181* 240*  < > = values in this interval not displayed.  Liver fxn No results for input(s): AST, ALT, ALKPHOS, BILITOT, PROT, ALBUMIN in the last 168 hours. coags No results for input(s): APTT, INR in the last 168 hours. Sepsis markers  Recent Labs Lab 12/28/16 0533 12/29/16 0423 12/30/16 0215  LATICACIDVEN 1.1  --   --   PROCALCITON  --  0.13 <0.10   Cardiac markers  Recent Labs Lab 12/29/16 0423 12/29/16 1219 12/29/16 1932  TROPONINI 0.05* 0.04* 0.06*   BNP No results for input(s): PROBNP in the last 168 hours. ABG  Recent Labs Lab 12/28/2016 1512  12/28/16 0536 12/29/16 0202 12/29/16 0513  PHART  --   < > 7.277* 7.252* 7.402  PCO2ART  --   < > 81.3* 94.3* 69.9*  PO2ART  --   < > 77.1* 77.8* 89.0  HCO3 37.6*  < > 36.7* 40.1* 43.6*  TCO2 39  --   --   --  46  < > = values in this interval not displayed.  CBG trend  Recent Labs Lab 12/30/16 1149 12/30/16 1608 12/30/16 1956 12/30/16 2358 12/31/16 0336  GLUCAP 213* 219* 199* 234* 224*    IMAGING:  ECG:  DIAGNOSES: Principal Problem:   Acute on chronic respiratory failure with hypoxia and hypercapnia (HCC) Active Problems:   Essential hypertension   GERD   PAF (paroxysmal atrial fibrillation) (HCC)    Dyslipidemia   Acute exacerbation of chronic obstructive pulmonary disease (COPD) (HCC)   CHF (congestive heart failure) (HCC)   Protein-calorie malnutrition, severe   Hypotension (arterial)   Encephalopathy acute   ASSESSMENT / PLAN:  PULMONARY  ASSESSMENT: Acute COPD exacerbation secondary to viral infection with RSV, failed BiPAP Hypercapnic respiratory failure  ABG shows appropriate improvement in CO2 to 70, trying to avoid respiratory alkalosis  On exam her wheezing has improved, will taper steroids  PLAN:   On volume control ventilation, spontaneous breathing trial today  Duoneb q4h scheduled  Solumedrol 40 mg BID Follow up respiratory culture  Follow up chest xray tomorrow   CARDIOVASCULAR  ASSESSMENT:  Normotensive  Hx of paroxysmal atrial fibrillation, hyperlipidemia, aortic stenosis  PLAN:  Home med is atenolol 50mg  qd, continue Metoprolol 2.5 q6h Continue home med Simvastatin   Cardiac monitor   RENAL  ASSESSMENT:  No acute issues  PLAN:   Follow up BMP tomorrow   GASTROINTESTINAL  ASSESSMENT:   GERD  PLAN:   Continue tube feeds  protonix   HEMATOLOGIC  ASSESSMENT:  VTE prophylaxis  PLAN: SCD's and Lovenox  Follow up CBC   INFECTIOUS  ASSESSMENT:   RSV with secondary COPD exacerbation, has received 6 days of ceftriaxone and azithromycin for CAP coverage, will D/C today   Fever, resolved PLAN:   Follow up respiratory culture   ENDOCRINE  ASSESSMENT:   Hyperglycemia 2/2 steroids, tapering solumedrol   PLAN:   Sliding scale insulin  CBG q4h  NEUROLOGIC  ASSESSMENT:  Encephalopathy with confusion and aggitation  Failed precedex, fentanyl, and intermittent versed, is on versed gtt  PLAN:   RASS goal 0 to -2 Discontinue versed gtt  Order precedex  Fentanyl 50-200 mg q1h PRN breakthrough pain  Continue restraints for patient safety   CLINICAL SUMMARY: Pt with COPD exacerbation secondary to RSV infection with persistent wheeze on  pulmonary exam and extensive purulent mucous with suction. Pt remains agitated. Given her age, severe COPD with superimposed RSV infection she is not ready for wean and has a guarded prognosis. We have began goals of care discussion with her family this morning.   I have personally obtained a history, examined the patient, evaluated laboratory and imaging results, formulated the assessment and plan and placed orders. CRITICAL CARE: The patient is critically ill with multiple organ systems failure and requires high complexity decision making for assessment and support, frequent evaluation and titration of therapies, application of advanced monitoring technologies and extensive interpretation of multiple databases. Critical Care Time devoted to patient care services described in this note is 60 minutes.    Pulmonary and [place] Pager: [phone]  12/31/2016, 4:30 AM

## 2016-12-31 NOTE — Progress Notes (Signed)
RT note- bite block removed 

## 2017-01-01 LAB — CBC
HCT: 39.1 % (ref 36.0–46.0)
HEMOGLOBIN: 12.2 g/dL (ref 12.0–15.0)
MCH: 29.3 pg (ref 26.0–34.0)
MCHC: 31.2 g/dL (ref 30.0–36.0)
MCV: 93.8 fL (ref 78.0–100.0)
Platelets: 150 10*3/uL (ref 150–400)
RBC: 4.17 MIL/uL (ref 3.87–5.11)
RDW: 15.4 % (ref 11.5–15.5)
WBC: 15.7 10*3/uL — ABNORMAL HIGH (ref 4.0–10.5)

## 2017-01-01 LAB — BASIC METABOLIC PANEL
ANION GAP: 9 (ref 5–15)
BUN: 29 mg/dL — ABNORMAL HIGH (ref 6–20)
CHLORIDE: 95 mmol/L — AB (ref 101–111)
CO2: 38 mmol/L — ABNORMAL HIGH (ref 22–32)
Calcium: 8.6 mg/dL — ABNORMAL LOW (ref 8.9–10.3)
Creatinine, Ser: 0.41 mg/dL — ABNORMAL LOW (ref 0.44–1.00)
GFR calc non Af Amer: >60 mL/min (ref >60)
Glucose, Bld: 245 mg/dL — ABNORMAL HIGH (ref 65–99)
Potassium: 4.1 mmol/L (ref 3.5–5.1)
SODIUM: 142 mmol/L (ref 135–145)

## 2017-01-01 LAB — CULTURE, RESPIRATORY W GRAM STAIN: Special Requests: NORMAL

## 2017-01-01 LAB — GLUCOSE, CAPILLARY
GLUCOSE-CAPILLARY: 251 mg/dL — AB (ref 65–99)
GLUCOSE-CAPILLARY: 217 mg/dL — AB (ref 65–99)
Glucose-Capillary: 235 mg/dL — ABNORMAL HIGH (ref 65–99)

## 2017-01-01 LAB — CULTURE, RESPIRATORY

## 2017-01-01 MED ORDER — LEVOFLOXACIN IN D5W 750 MG/150ML IV SOLN: 750.0000 mg | INTRAVENOUS | Status: DC

## 2017-01-01 MED ORDER — INSULIN ASPART 100 UNIT/ML ~~LOC~~ SOLN
0.0000 [IU] | Freq: Three times a day (TID) | SUBCUTANEOUS | Status: DC
  Administered 2017-01-01: 7 [IU] via SUBCUTANEOUS

## 2017-01-01 MED ORDER — SULFAMETHOXAZOLE-TRIMETHOPRIM 400-80 MG/5ML IV SOLN
15.0000 mg/kg/d | Freq: Three times a day (TID) | INTRAVENOUS | Status: DC
  Administered 2017-01-01: 272 mg via INTRAVENOUS
  Filled 2017-01-01 (×2): qty 17

## 2017-01-04 ENCOUNTER — Telehealth: Payer: Self-pay

## 2017-01-04 NOTE — Telephone Encounter (Signed)
On 01/04/17 I received a death certificate from [facility] (original). The death certificate is for burial. The patient is a patient of Doctor McQuaid. The death certificate will be taken to Pulmonary Unit @ [facility] tomorrow am for signature.  On Feb 02, 2017 I received the death certificate back from Doctor McQuaid. I got the death certificate ready and called the funeral home to let them know the death certificate ready for pickup.

## 2017-01-19 NOTE — Progress Notes (Signed)
PATIENT DEATH NOTE:  No lung sounds auscultated, pupils fixed and dilated, no palatable pulses. Confirmed by Dorena Cookey, RN.  Time of death 76. Next of kin notified.

## 2017-01-19 NOTE — Progress Notes (Signed)
RT terminally extubated to room air per MD order. No complications. RN and family at bedside. RT will continue to monitor.

## 2017-01-19 NOTE — Progress Notes (Addendum)
PULMONARY  / CRITICAL CARE MEDICINE  Name: [name] MRN: [id number] DOB: 05-20-1933    LOS: 31  REFERRING MD :  Ree Kida   CHIEF COMPLAINT:  AMS   BRIEF PATIENT DESCRIPTION:  Pt is encephelopathic; therefore, this HPI is obtained from chart review. [name] is a 83 y.o. female with PMH as outlined below including COPD for which she uses supplemental O2 as needed. She went to her PCP on 12/28/2016 for shortness of breath and increased work of breathing and while there, was found to have SPO2 in the mid 80s. She was subsequently sent to the ED for further evaluation. In ED, she was felt to have AECOPD and was subsequently admitted by hospitalist for further management.  RVP noted positive for RSV.  On 4/9, she had worsening in her work of breathing; therefore, she was started on BiPAP and was transferred to SDU.  She remained on BiPAP throughout the day.  Later that evening, she was slightly agitated therefore was given 0.5 mg ativan with improvement in agitation / anxiety; however, in early AM hours 4/10, she was found to be unresponsive.  ABG demonstrated worsening hypercarbic respiratory failure (7.25 / 94 / 77) despite high settings on BiPAP (20/6) and Vt noted low in high 100's to low 200's.  She was subsequently transferred to ICU where she required intubation.  LINES / TUBES: NG/OG tube 4/10>>  Urethral catheter 4/10>>  Airway 4/10>> Peripheral IV left wrist, right forearm, right forearm 4/10 >>   CULTURES: MRSA nasal swab positive  Respiratory culture 4/11 >> STENOTROPHOMONAS MALTOPHILIA, susceptibilities to follow  ANTIBIOTICS: Azithromycin 4/7 -4/10 Ceftriaxone 4/6- 4/10 TMP-SMX 4/13>>   SIGNIFICANT EVENTS:  4/6 > admit. 4/9 > transfer to SDU and started on BiPAP. 4/10 > PCCM consult > transfer to ICU and intubated. 4/11 > pulmonary exam improving, d/c steroids and abx, spontaneous weaning trial  4/13> sputum cultures grew s. Maltophilia, started Bactrim   LEVEL  OF CARE:  Critical care  PRIMARY SERVICE:  PCCM  CONSULTANTS:  None  CODE STATUS FULL  DIET:  Tube feeds  DVT Px:  lovenox  GI Px:  protonix   PAST MEDICAL HISTORY :  Past Medical History:  Diagnosis Date  . Allergic rhinitis    takes Mucinex daily  . Anxiety    takes Alprazolam daily  . Aortic stenosis    mild by echo (2008)  . Asthma    uses Albuterol daily as needed. Pulmicort and Perforomist neb daily  . Bronchitis, chronic obstructive, with exacerbation ([place])   . Bruises easily   . Cataract    right  . Chronic back pain    HNP  . COPD (chronic obstructive pulmonary disease) (HCC)    Xopenex and Atrobent neb daily   . GERD (gastroesophageal reflux disease)    takes Omeprazole daily  . Hemorrhoid   . History of bronchitis    many yrs ago  . History of colon polyps   . History of gout    no meds required  . History of staph infection   . Hyperlipidemia    takes Simvastatin daily  . Hypertension    takes Losartan daily  . Joint pain   . Joint swelling   . Multiple sclerosis ([place])   . Paroxysmal atrial fibrillation ([place]) 11/08  . Peripheral edema   . Pneumonia    hx of-as a baby  . PONV (postoperative nausea and vomiting)   . Rheumatoid arthritis(714.0)   . Shortness  of breath    with exertion  . Stasis dermatitis    Past Surgical History:  Procedure Laterality Date  . ABDOMINAL HYSTERECTOMY    . BREAST CYST EXCISION    . cataract surgery Left   . COLONOSCOPY    . LEFT AND RIGHT HEART CATHETERIZATION WITH CORONARY ANGIOGRAM N/A 07/13/2013   Procedure: LEFT AND RIGHT HEART CATHETERIZATION WITH CORONARY ANGIOGRAM;  Surgeon: Blane Ohara, MD;  Location: [facility] CATH [facility];  Service: Cardiovascular;  Laterality: N/A;  . LUMBAR DISC SURGERY  2003   L5  . LUMBAR LAMINECTOMY/DECOMPRESSION MICRODISCECTOMY Right 12/15/2013   Procedure: LUMBAR LAMINECTOMY/DECOMPRESSION MICRODISCECTOMY 1 LEVEL two/three;  Surgeon: Winfield Cunas, MD;  Location: [facility];  Service:  Neurosurgery;  Laterality: Right;  Right L23 microdiskectomy   Prior to Admission medications   Medication Sig Start Date End Date Taking? Authorizing Provider  ANORO ELLIPTA 62.5-25 MCG/INH AEPB INHALE 1 PUFF INTO THE LUNGS DAILY. 12/07/16  Yes Deneise Lever, MD  apixaban (ELIQUIS) 5 MG TABS tablet Take 1 tablet (5 mg total) by mouth 2 (two) times daily. 12/15/16  Yes Virgel Manifold, MD  atenolol (TENORMIN) 50 MG tablet Take 1 tablet (50 mg total) by mouth daily. 12/15/16  Yes Virgel Manifold, MD  budesonide (PULMICORT) 0.25 MG/2ML nebulizer solution USE 1 VIAL IN NEBULIZER TWICE A DAY 12/31/15  Yes Clinton D Young, MD  formoterol (PERFOROMIST) 20 MCG/2ML nebulizer solution USE 1 VIAL IN NEBULIZER TWICE A DAY 08/21/16  Yes Deneise Lever, MD  gabapentin (NEURONTIN) 300 MG capsule Take 300 mg by mouth 4 (four) times daily.  01/19/16  Yes Historical Provider, MD  guaiFENesin (MUCINEX) 600 MG 12 hr tablet Take 600 mg by mouth 2 (two) times daily as needed for cough or to loosen phlegm.    Yes Historical Provider, MD  hydrochlorothiazide 25 MG tablet Take 25 mg by mouth daily.    Yes Historical Provider, MD  ipratropium (ATROVENT) 0.02 % nebulizer solution Take 2.5 mLs (0.5 mg total) by nebulization 3 (three) times daily as needed for wheezing or shortness of breath. DX code J44.9. 08/19/16  Yes Deneise Lever, MD  levalbuterol (XOPENEX) 0.63 MG/3ML nebulizer solution Take 3 mLs (0.63 mg total) by nebulization every 6 (six) hours as needed for wheezing or shortness of breath. 06/30/16  Yes Deneise Lever, MD  omeprazole (PRILOSEC) 20 MG capsule Take 20 mg by mouth daily.   Yes Historical Provider, MD  PROAIR HFA 108 (90 BASE) MCG/ACT inhaler INHALE 2 PUFFS BY MOUTH EVERY 6 HOURS AS NEEDED FOR WHEEZING OR SHORTNESS OF BREATH 11/15/14  Yes Deneise Lever, MD  simvastatin (ZOCOR) 20 MG tablet Take 20 mg by mouth daily.    Yes Historical Provider, MD   Allergies  Allergen Reactions  . Amoxicillin-Pot  Clavulanate Other (See Comments)    GI upset  . Daliresp [Roflumilast] Other (See Comments)    unknown  . Diltiazem Nausea Only  . Montelukast Sodium Other (See Comments)     flu-like symptoms  . Zafirlukast Other (See Comments)    unknown  . Adhesive [Tape] Rash    Please use paper tape    FAMILY HISTORY:  Family History  Problem Relation Age of Onset  . Asthma Mother   . Emphysema Father   . Alzheimer's disease Brother   . Colon cancer Other     sibling  . Heart disease Other     sibling   SOCIAL HISTORY:  reports that she quit  smoking about 27 years ago. Her smoking use included Cigarettes. She has a 25.00 pack-year smoking history. She has never used smokeless tobacco. She reports that she does not drink alcohol or use drugs.  REVIEW OF SYSTEMS:  Complete review of systems could not be obtained as pt is currently sedated and encephalopathic    INTERVAL HISTORY:   VITAL SIGNS: Temp:  [97.9 F (36.6 C)-99.1 F (37.3 C)] 99.1 F (37.3 C) (04/13 0320) Pulse Rate:  [84-103] 92 (04/13 0600) Resp:  [11-14] 12 (04/13 0600) BP: (96-132)/(58-73) 102/58 (04/13 0600) SpO2:  [94 %-100 %] 95 % (04/13 0600) FiO2 (%):  [40 %] 40 % (04/13 0400) Weight:  [54.4 kg (119 lb 14.9 oz)] 54.4 kg (119 lb 14.9 oz) (04/13 0500) HEMODYNAMICS:   VENTILATOR SETTINGS: Vent Mode: PRVC FiO2 (%):  [40 %] 40 % Set Rate:  [12 bmp] 12 bmp Vt Set:  [450 mL] 450 mL PEEP:  [5 cmH20] 5 cmH20 Plateau Pressure:  [15 cmH20] 15 cmH20 INTAKE / OUTPUT: Intake/Output      04/12 0701 - 04/13 0700   I.V. (mL/kg) 254.9 (4.7)   NG/GT 510   Total Intake(mL/kg) 764.9 (14.1)   Urine (mL/kg/hr) 525 (0.4)   Total Output 525   Net +239.9         PHYSICAL EXAMINATION: General:  Sedated. well nourished  Neuro: Sedated, not responsive, pupils constricted but reactive  HEENT: Intubated, blood tinged moist mucous membranes  Cardiovascular: RRR, no murmur  Lungs: intubated, diffuse moderate wheezing over  anterior lung fields   Abdomen:  NG tube in place, bs+, soft, non distended Musculoskeletal: moving all extremities, SCDs in place  Skin:  Bruising over left and right arm   LABS: Cbc  Recent Labs Lab 12/29/16 0241 12/29/16 0423 Jan 31, 2017 0432  WBC 20.5* 13.7* 15.7*  HGB 14.6 13.3 12.2  HCT 46.5* 42.3 39.1  PLT 212 180 150    Chemistry   Recent Labs Lab 01/10/2017 1633  12/28/16 0533  12/29/16 0423 12/29/16 1219 12/31/16 0235 Jan 31, 2017 0432  NA  --   < > 136  < > 152* 139 140 142  K  --   < > 5.0  < > 4.6 4.0 4.0 4.1  CL  --   < > 90*  < > 91* 93* 97* 95*  CO2  --   < > 37*  < > >50* 36* 38* 38*  BUN  --   < > 26*  < > 26* 28* 29* 29*  CREATININE  --   < > 0.63  < > 0.54 0.64 0.47 0.41*  CALCIUM  --   < > 8.8*  < > 8.1* 8.1* 8.2* 8.6*  MG 1.8  --  2.2  --  2.4  --   --   --   PHOS  --   --   --   --  2.3*  --   --   --   GLUCOSE  --   < > 200*  < > 171* 181* 240* 245*  < > = values in this interval not displayed.  Liver fxn No results for input(s): AST, ALT, ALKPHOS, BILITOT, PROT, ALBUMIN in the last 168 hours. coags No results for input(s): APTT, INR in the last 168 hours. Sepsis markers  Recent Labs Lab 12/28/16 0533 12/29/16 0423 12/30/16 0215  LATICACIDVEN 1.1  --   --   PROCALCITON  --  0.13 <0.10   Cardiac markers  Recent Labs Lab 12/29/16 0423  12/29/16 1219 12/29/16 1932  TROPONINI 0.05* 0.04* 0.06*   BNP No results for input(s): PROBNP in the last 168 hours. ABG  Recent Labs Lab 12/26/2016 1512  12/28/16 0536 12/29/16 0202 12/29/16 0513  PHART  --   < > 7.277* 7.252* 7.402  PCO2ART  --   < > 81.3* 94.3* 69.9*  PO2ART  --   < > 77.1* 77.8* 89.0  HCO3 37.6*  < > 36.7* 40.1* 43.6*  TCO2 39  --   --   --  46  < > = values in this interval not displayed.  CBG trend  Recent Labs Lab 12/31/16 1137 12/31/16 1555 12/31/16 1936 12/31/16 2329 2017-01-14 0322  GLUCAP 225* 223* 143* 227* 251*     IMAGING:  ECG:  DIAGNOSES: Principal Problem:   Acute on chronic respiratory failure with hypoxia and hypercapnia (HCC) Active Problems:   Essential hypertension   GERD   PAF (paroxysmal atrial fibrillation) (HCC)   Dyslipidemia   Acute exacerbation of chronic obstructive pulmonary disease (COPD) (HCC)   CHF (congestive heart failure) (HCC)   Protein-calorie malnutrition, severe   Hypotension (arterial)   Encephalopathy acute   ASSESSMENT / PLAN:  PULMONARY  ASSESSMENT: Acute COPD exacerbation secondary to viral infection with RSV Respiratory cultures grew S. Maltophilia, TMP- SMX started 4/13 Chronic hypercapnic respiratory failure  ABG shows appropriate improvement in CO2 to 70, trying to avoid respiratory alkalosis   PLAN:   On volume control ventilation Duoneb q4h scheduled  Solumedrol 40 mg BID  CARDIOVASCULAR  ASSESSMENT:  Normotensive  Hx of paroxysmal atrial fibrillation, hyperlipidemia, aortic stenosis  PLAN:  Home med is atenolol 50mg  qd, continue Metoprolol 2.5 q6h Continue home med Simvastatin   Cardiac monitor   RENAL  ASSESSMENT:  No acute issues  PLAN:   Follow up BMP tomorrow   GASTROINTESTINAL  ASSESSMENT:   GERD  PLAN:   Continue tube feeds  protonix   HEMATOLOGIC  ASSESSMENT:  VTE prophylaxis  No acute issues  PLAN: SCD's and Lovenox  Follow up CBC daily   INFECTIOUS  ASSESSMENT:   RSV and S. Maltophilia with secondary COPD exacerbation, has received 6 days of ceftriaxone and azithromycin for CAP coverage. Sputum grew s. Maltophilia today, started TMP- SMX today  Fever, resolved PLAN:   TMP- SMX   ENDOCRINE  ASSESSMENT:   Hyperglycemia 2/2 steroids PLAN:   Sliding scale insulin increased from moderate to resistant  CBG q4h  NEUROLOGIC  ASSESSMENT:  Encephalopathy with confusion and aggitation  Failed precedex, fentanyl, and intermittent versed, is on versed gtt  PLAN:   RASS goal 0 to -2 Versed gtt   Fentanyl gtt  Fentanyl 50-200 mg q1h PRN breakthrough pain  Continue restraints for patient safety   CLINICAL SUMMARY: Pt with COPD exacerbation secondary to RSV infection and S. maltophilia with persistent wheeze on pulmonary exam and extensive purulent mucous with suction. Pt remains agitated. Given her age, severe COPD with superimposed RSV infection she is not ready for wean and has a guarded prognosis. We have began goals of care discussion with her family this morning.   I have personally obtained a history, examined the patient, evaluated laboratory and imaging results, formulated the assessment and plan and placed orders. CRITICAL CARE: The patient is critically ill with multiple organ systems failure and requires high complexity decision making for assessment and support, frequent evaluation and titration of therapies, application of advanced monitoring technologies and extensive interpretation of multiple databases. Critical Care Time devoted to patient  care services described in this note is 60 minutes.    Pulmonary and [place] Pager: [phone]  01/04/17, 6:54 AM   Attending: I have seen and examined the patient with nurse practitioner/resident and agree with the note above.  We formulated the plan together and I elicited the following history.    Remains on versed and fentanyl gtt Decreased secretions today  On exam:  Poor air omvement, some wheeze Elderly, on vent RRR, no mgr  CXR indepednetly reviewed> no infiltrate, emphysema  Acute respiratory failure with hypoxemia> continuing full vent support from  COPD, severe in acute exacerbation> continue solumedrol, duoneb scheduled  Discussed goals of care with family, given her severe disease and poor prognosis they understand her situation and feel that she would not want this level of care.  We will extubate with fentanyl and versed for comfort, withdrawal of care.  My cc time  30 minutes  [name], MD [place] PCCM Pager: [phone] Cell: [phone] After 3pm or if no response, call [phone]

## 2017-01-19 NOTE — Accreditation Note (Signed)
o Restraints reported to CMS  Pursuant to regulation 482.13 (G) (3) use of restraints was logged and CMS was notified via email on 04.17.2018 at 0718  by Evette Cristal, RN, Patient Financial controller.

## 2017-01-19 NOTE — Discharge Summary (Signed)
[facility] PCCM Death Note  Date of Death 01/10/17 Date of admission: Jan 03, 2017  Cause of deatH: COPD exacerbation RSV  HPI/Hospital course: [name] a 83 y.o.femalewith PMH as outlined below including COPD for which she uses supplemental O2 as needed. She went to her PCP on 03-Jan-2017 for shortness of breath and increased work of breathing and while there, was found to have SPO2 in the mid 80s. She was subsequently sent to the ED for further evaluation. In ED, she was felt to have AECOPD and was subsequently admitted by hospitalist for further management.  RVP noted positive for RSV. On 4/9, she had worsening in her work of breathing; therefore, she was started on BiPAP and was transferred to SDU. She remained on BiPAP throughout the day. Later that evening, she was slightly agitated therefore was given 0.5 mg ativan with improvement in agitation / anxiety; however, in early AM hours 4/10, she was found to be unresponsive. ABG demonstrated worsening hypercarbic respiratory failure (7.25 / 94 / 77) despite high settings on BiPAP (20/6) and Vt noted low in high 100's to low 200's.  She was subsequently transferred to ICU where she required intubation.  In the ICU she made no significant progress with mechanical ventilation and after several days of IV steroids and bronchodilators she had persistent wheezing and could not be weaned from the ventilator.  Given the severity of her disease we discussed goals of care with family.  They stated that she did not want to be maintained on life support for an extended period of time and understood her overall poor prognosis.  Based on this we withdrew care and she passed with her family at the bedside.  [name], MD [place] PCCM Pager: [phone] Cell: [phone] After 3pm or if no response, call [phone]

## 2017-01-19 NOTE — Progress Notes (Addendum)
Pharmacy Antibiotic Note  [name] is a 83 y.o. female admitted on 12/27/2016 with COPD exacerbation that led to intubation. She has completed 5 days of ceftriaxone/azithromycin for CAP/COPD exacerbation.  Pharmacy has been consulted for Septra dosing for positive trach aspirate culture with S. Maltophilia. Patient is currently afebrile with elevated wbc 15.7 (trend up). Renal function is stable with Scr 0.41 (CrCl ~40 ml/min).   Addendum: sensitivities returned, S. Maltophilia is resistant to Septra, sensitive to Levaquin. Pharmacy now consulted for Levaquin dosing. Will switch over.   Plan: Levaquin 750 mg IV every 48 hours Monitor clinical improvement and renal function  Follow-up length of therapy  Height: 5\' 2"  (157.5 cm) Weight: 119 lb 14.9 oz (54.4 kg) IBW/kg (Calculated) : 50.1  Temp (24hrs), Avg:98.5 F (36.9 C), Min:97.9 F (36.6 C), Max:99.1 F (37.3 C)   Recent Labs Lab 12/26/16 0254 12/28/16 0533 12/29/16 0241 12/29/16 0423 12/29/16 1219 12/31/16 0235 01-17-2017 0432  WBC 9.2 28.6* 20.5* 13.7*  --   --  15.7*  CREATININE 0.68 0.63 0.44 0.54 0.64 0.47 0.41*  LATICACIDVEN  --  1.1  --   --   --   --   --     Estimated Creatinine Clearance: 43.6 mL/min (A) (by C-G formula based on SCr of 0.41 mg/dL (L)).    Allergies  Allergen Reactions  . Amoxicillin-Pot Clavulanate Other (See Comments)    GI upset  . Daliresp [Roflumilast] Other (See Comments)    unknown  . Diltiazem Nausea Only  . Montelukast Sodium Other (See Comments)     flu-like symptoms  . Zafirlukast Other (See Comments)    unknown  . Adhesive [Tape] Rash    Please use paper tape    Antimicrobials this admission: Ceftriaxone 4/6 >> 4/9 Azithromycin 4/7 >> 4/9 Septra 4/13 >>  Dose adjustments this admission: None  Microbiology results: 4/6 Resp panel PCR: RSV detected 4/6 MRSA PCR: positive 4/13 Trach: S. Maltophilia  Thank you for allowing pharmacy to be a part of this patient's  care.  Belia Heman, PharmD PGY1 Pharmacy Resident [phone] 17-Jan-2017 7:39 AM

## 2017-01-19 NOTE — Progress Notes (Signed)
   01/07/17 1130  Clinical Encounter Type  Visited With Patient and family together  Visit Type Other (Comment) ([place] consult)  Spiritual Encounters  Spiritual Needs Emotional  Stress Factors  Patient Stress Factors Not reviewed  Family Stress Factors Family relationships  Introduction to family. Pt intubated. Family coping. Follow up if needed.

## 2017-01-19 DEATH — deceased

## 2017-03-05 ENCOUNTER — Ambulatory Visit: Payer: Medicare Other | Admitting: Internal Medicine

## 2017-05-30 IMAGING — DX DG CHEST 2V
2 series · 2 of 2 positions shown · non-contrast
Comparison: None.

CLINICAL DATA: Shortness of breath, productive cough x3 days

EXAM:
CHEST  2 VIEW

[chest pa]
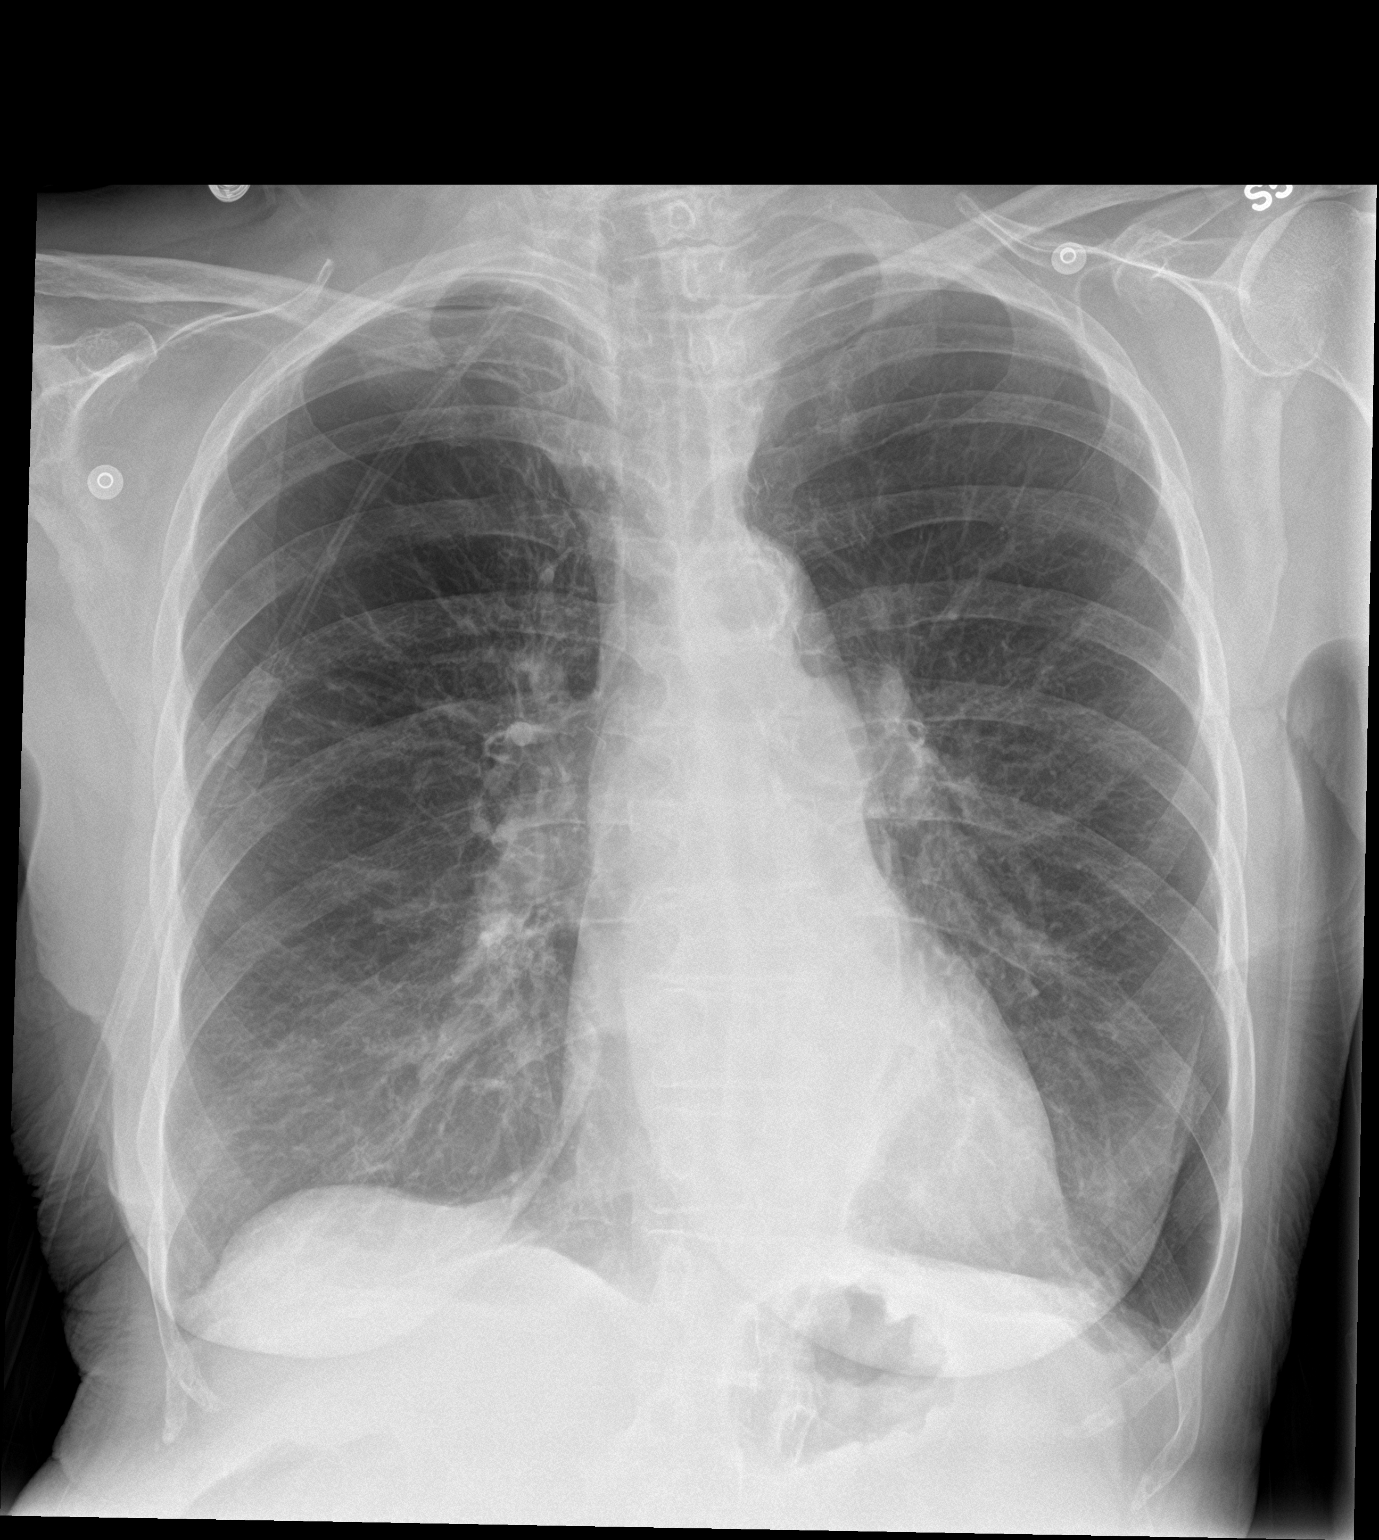

[chest lat]
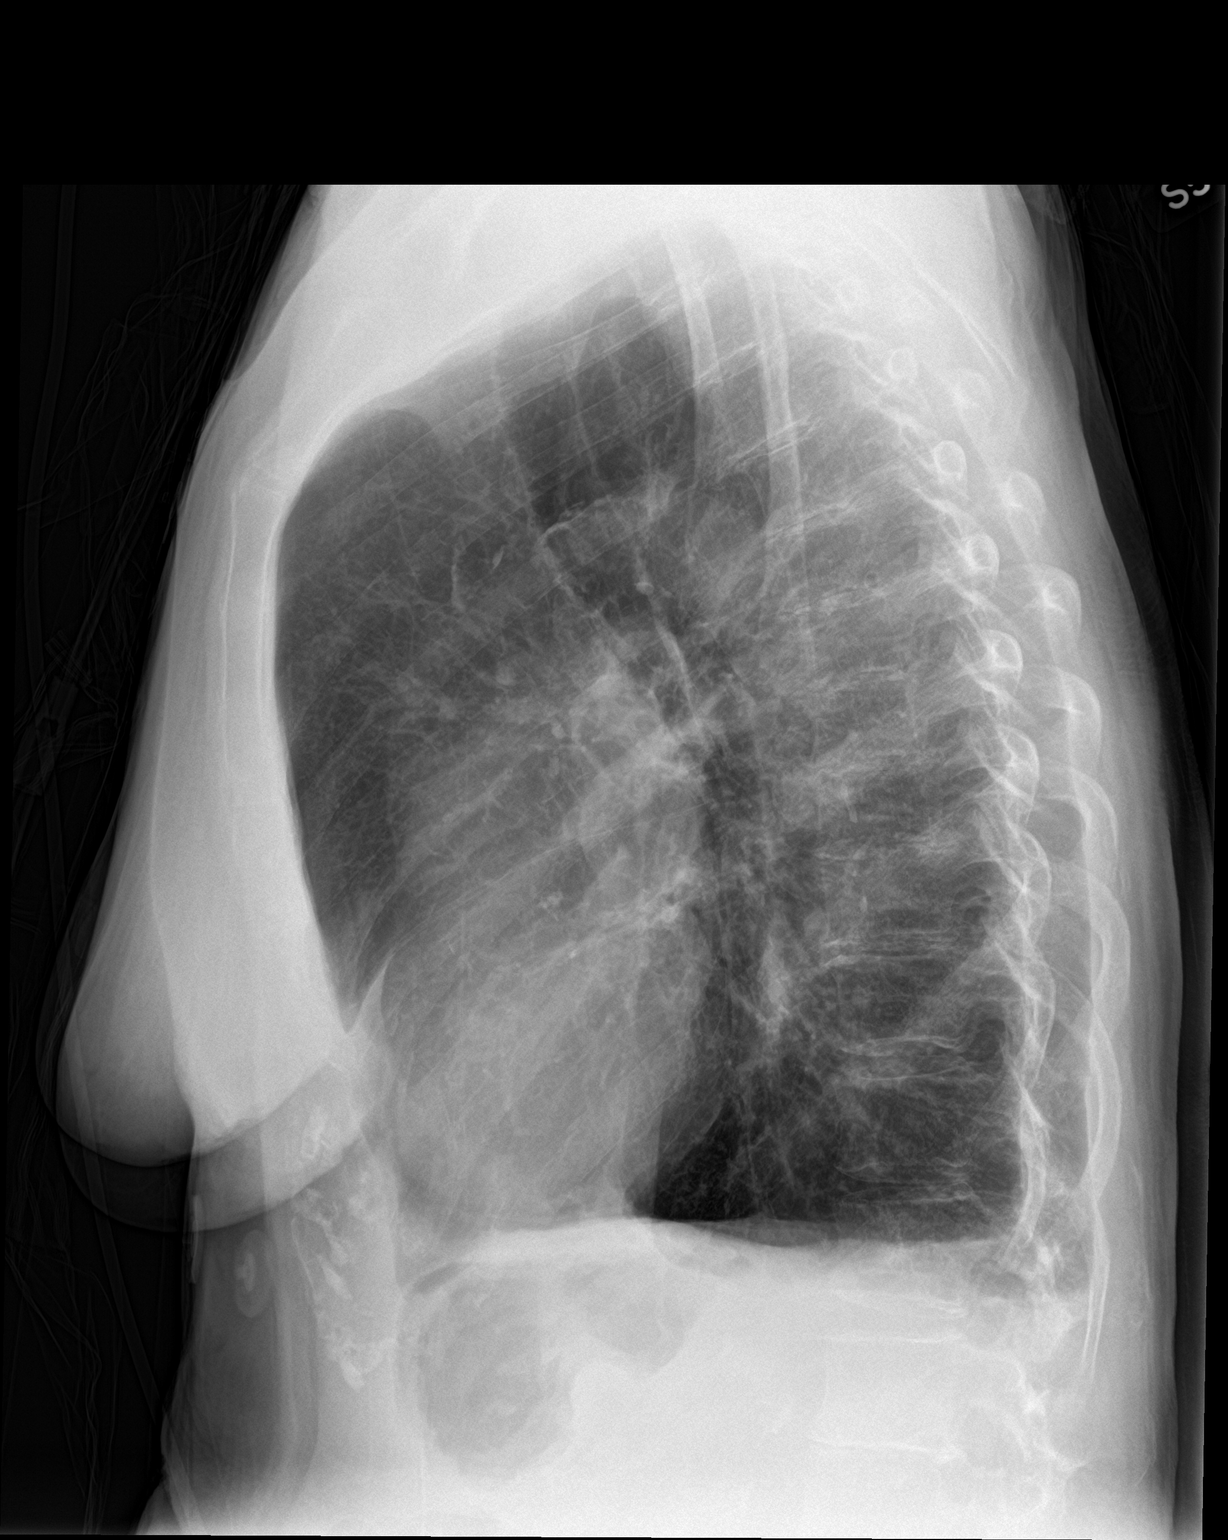

[2 of 2 positions shown; findings below may reference images not displayed]

FINDINGS: Lungs are clear. Minimal blunting of the left costophrenic angle,
favored to reflect mild scarring/pleural thickening. No
pneumothorax.

The heart is normal in size.

Visualized osseous structures are within normal limits.
IMPRESSION: No evidence of acute cardiopulmonary disease.

## 2017-06-02 IMAGING — CR DG CHEST 1V PORT
1 series · 1 of 1 positions shown · non-contrast
Comparison: 12/25/2016 chest radiograph

CLINICAL DATA: 83 y/o  F; shortness of breath.

EXAM:
PORTABLE CHEST 1 VIEW

[AP]
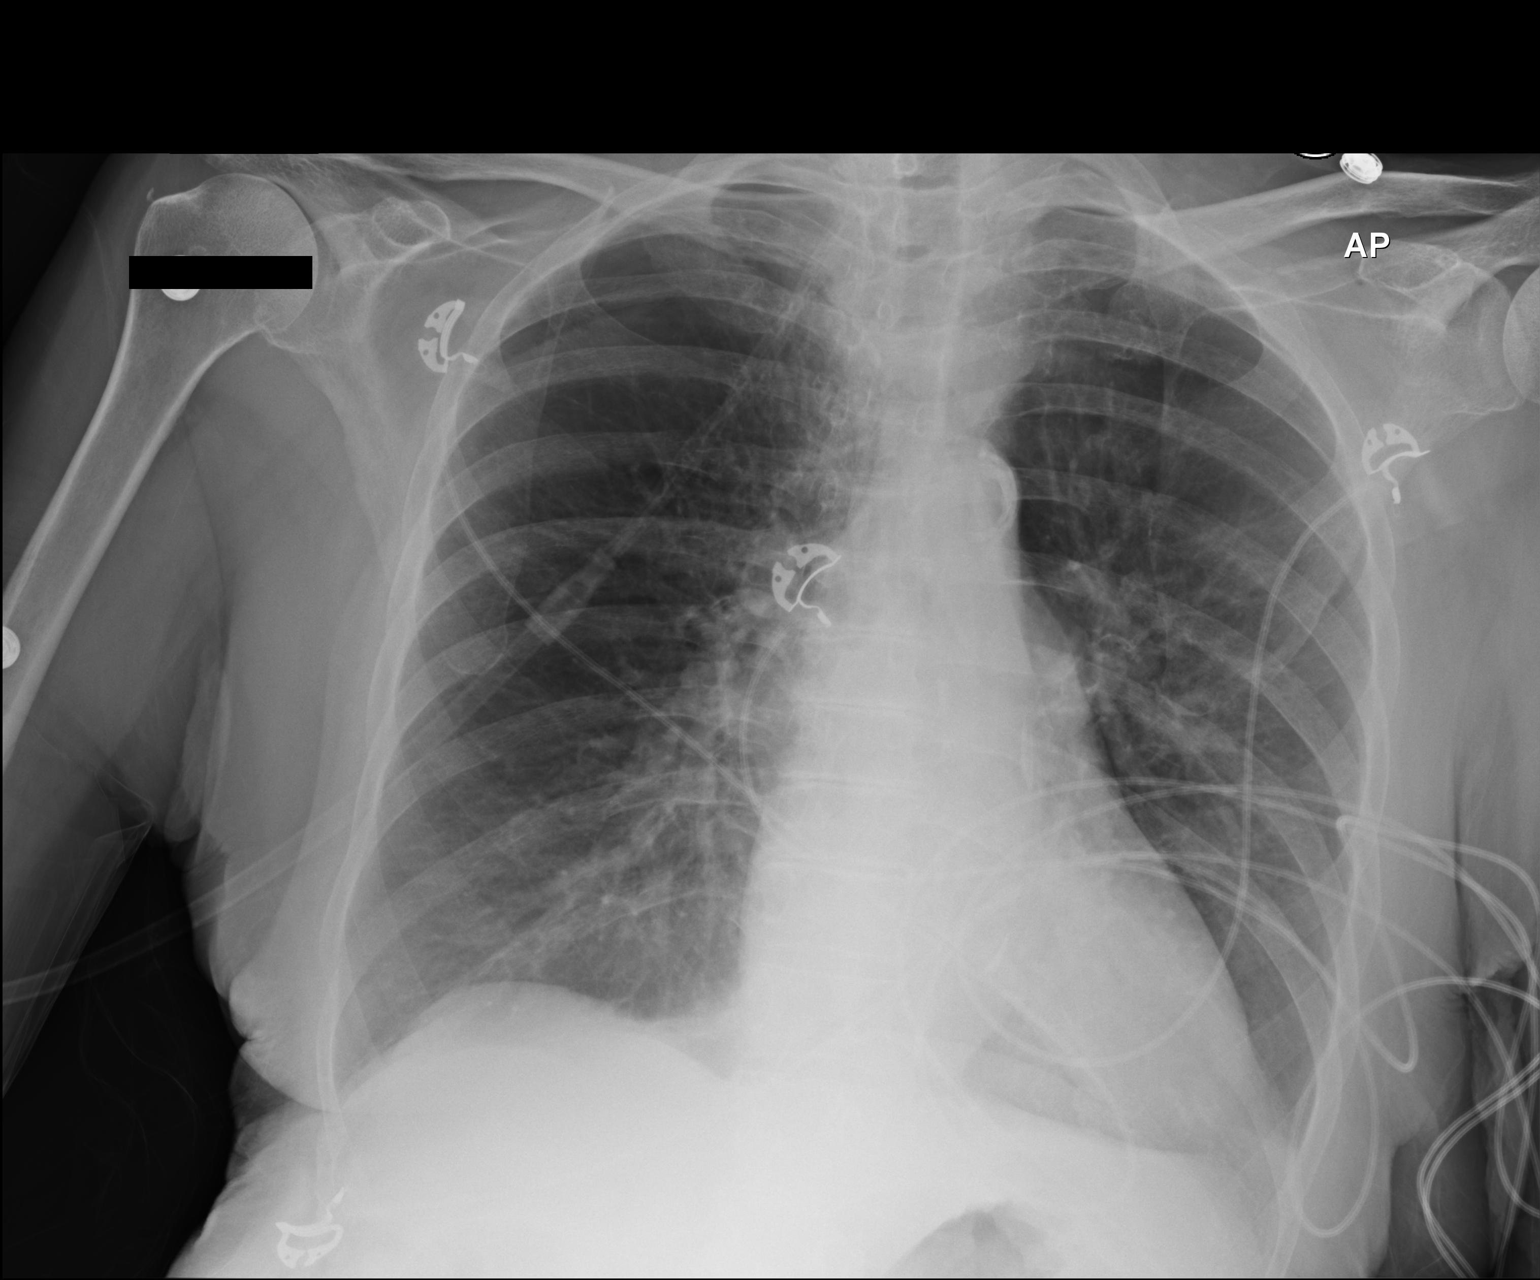

[1 of 1 positions shown; findings below may reference images not displayed]

FINDINGS: Stable normal cardiac silhouette given projection and technique.
Aortic atherosclerosis with calcification. Clear lungs. No pleural
effusion or pneumothorax. Bones are unremarkable.
IMPRESSION: No active disease.

By: Angel Gonzalo Cht M.D.

## 2017-06-03 IMAGING — DX DG ABD PORTABLE 1V
1 series · 1 of 1 positions shown · non-contrast
Comparison: Abdominal radiograph of earlier today

CLINICAL DATA: Orogastric tube placement

EXAM:
PORTABLE ABDOMEN - 1 VIEW

[abdomen kub]
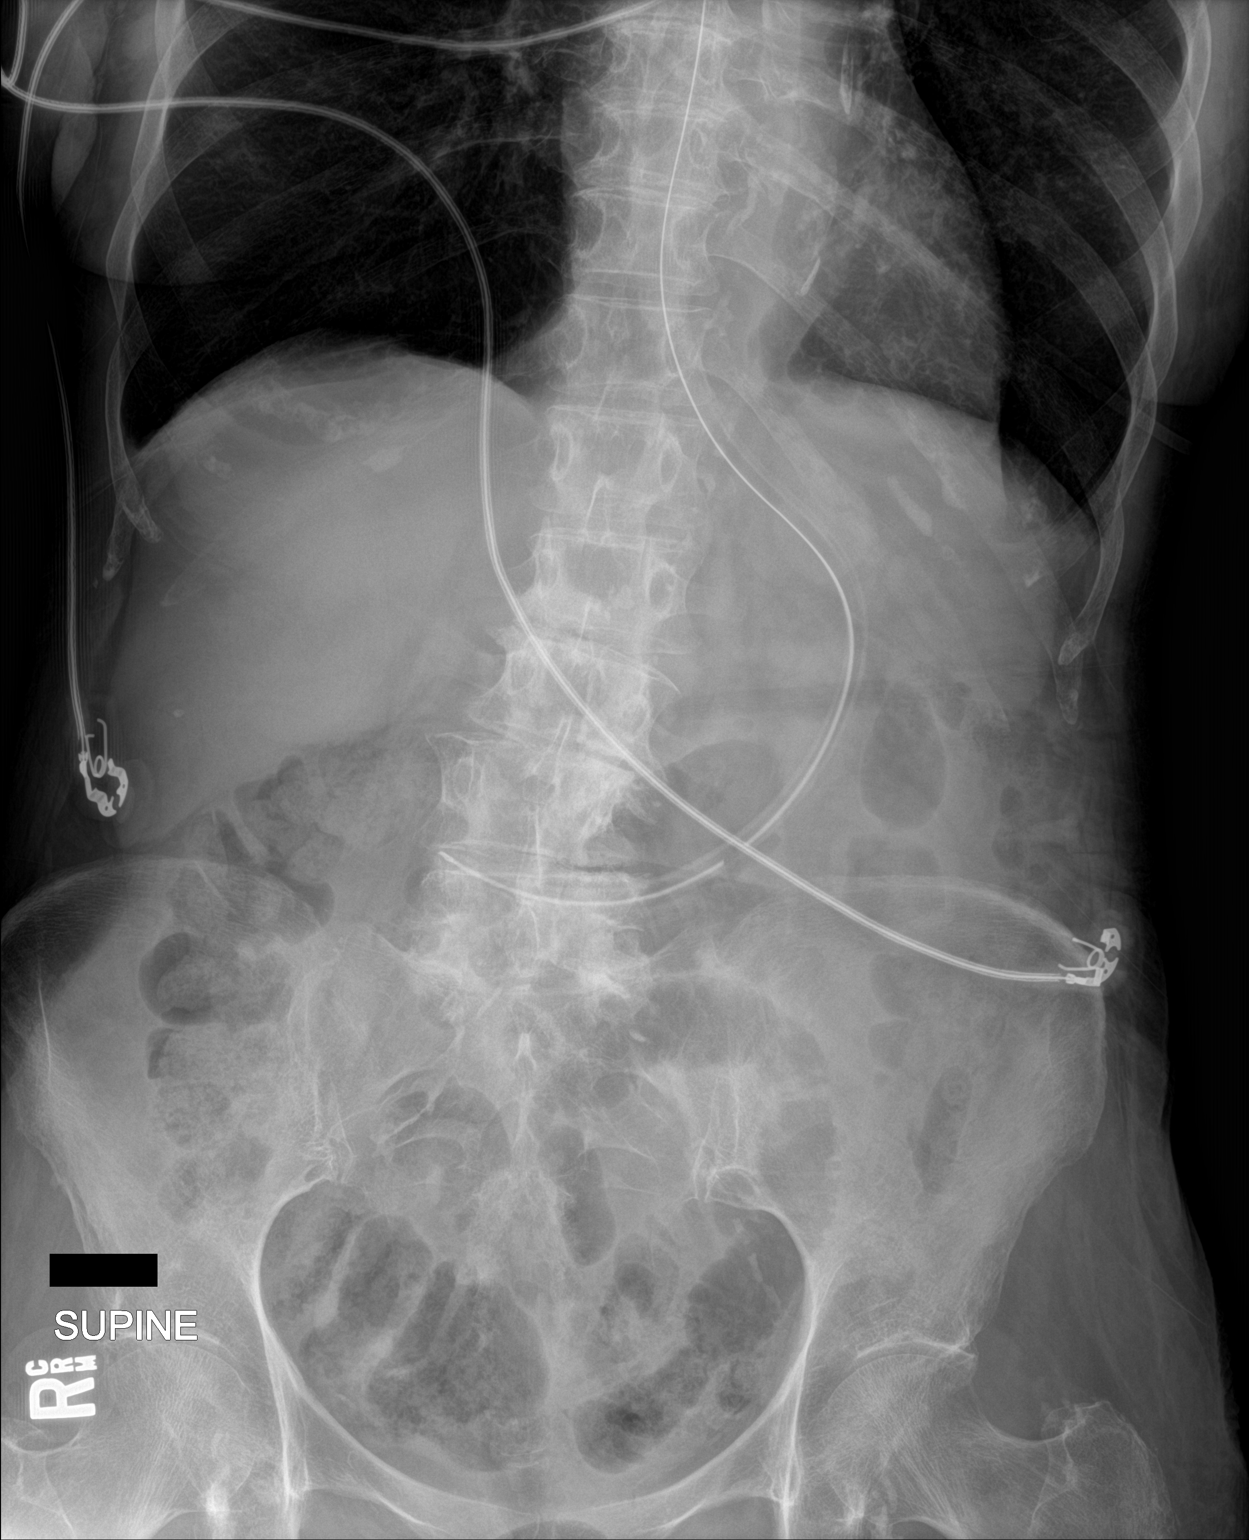

[1 of 1 positions shown; findings below may reference images not displayed]

FINDINGS: The orogastric tube tip lies in the pyloric region of the stomach.
The small and large bowel gas pattern is within the limits of
normal. The lung bases are clear. There degenerative changes of the
lumbar spine.
IMPRESSION: The orogastric tube tip lies in the pyloric region of the stomach.

## 2017-06-03 IMAGING — CR DG ABD PORTABLE 1V
1 series · 1 of 1 positions shown · non-contrast
Comparison: Lumbar spine radiographs performed 11/30/2013

CLINICAL DATA: Orogastric tube placement.  Initial encounter.

EXAM:
PORTABLE ABDOMEN - 1 VIEW

[AP]
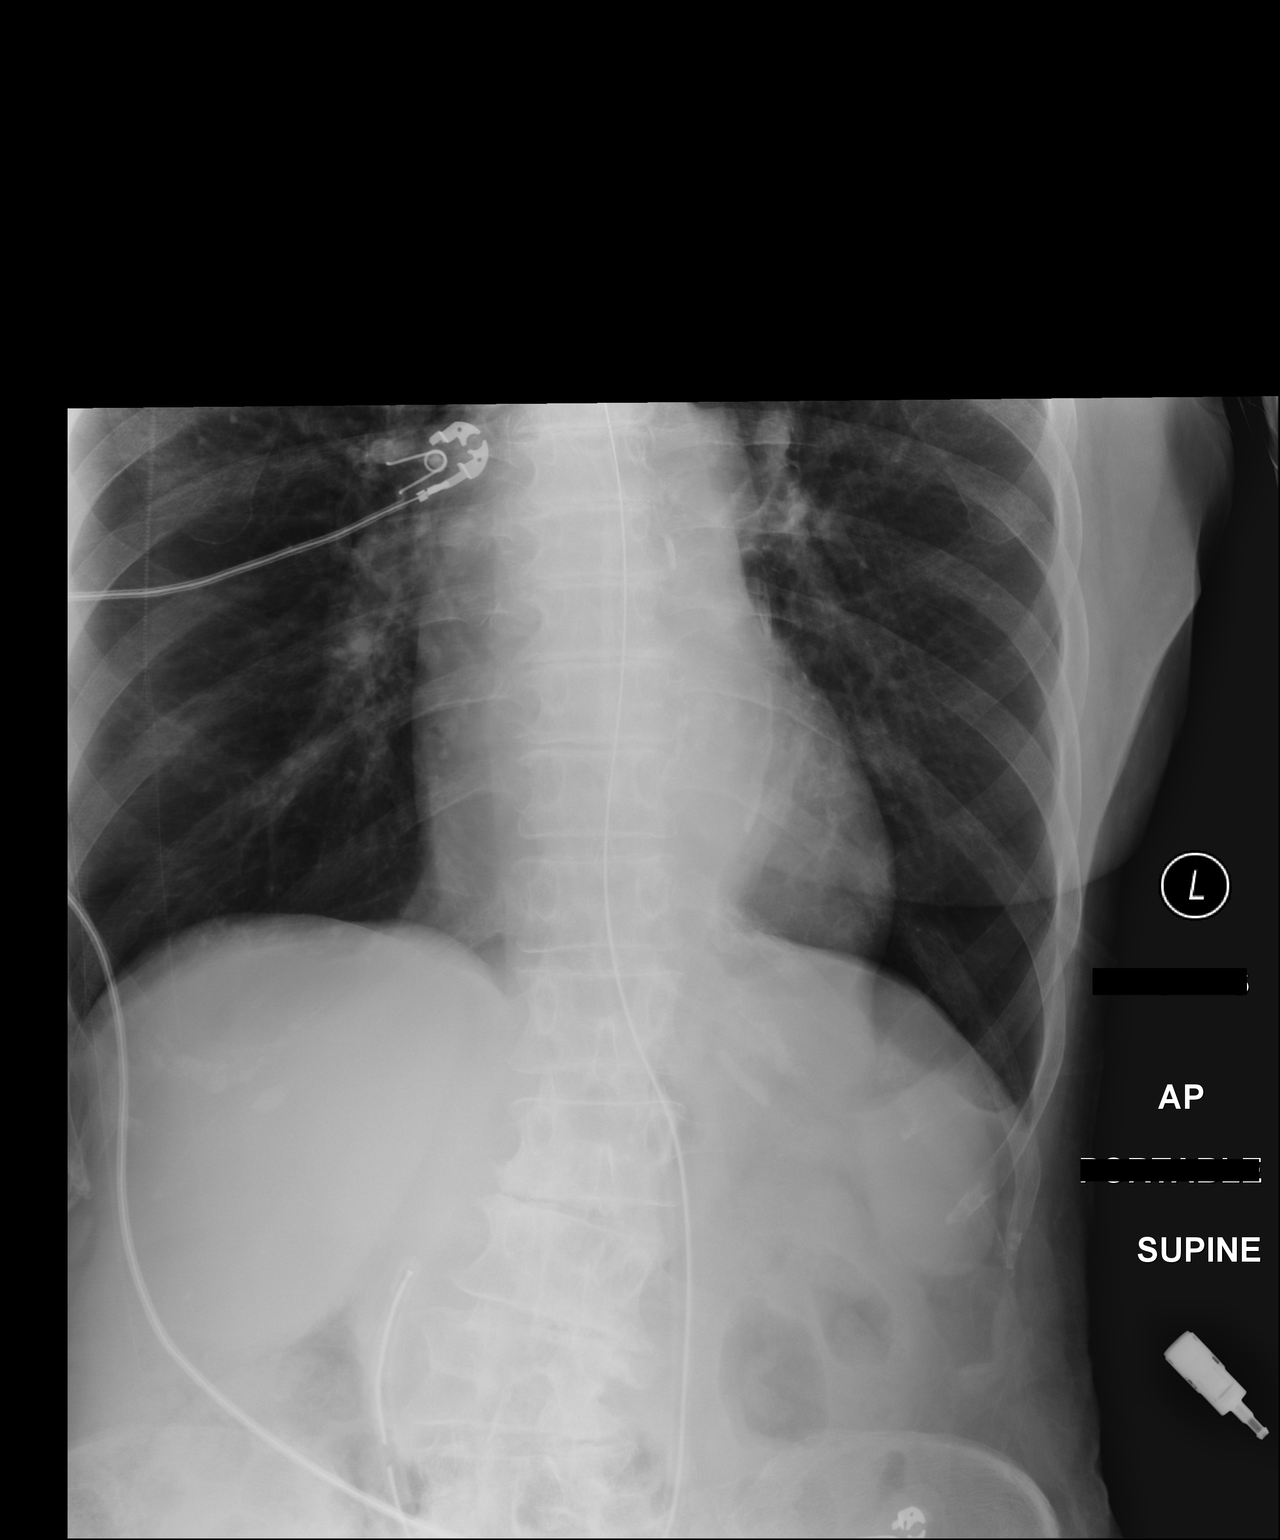

[1 of 1 positions shown; findings below may reference images not displayed]

FINDINGS: The patient's enteric tube is noted ending overlying the antrum of
the stomach.

The visualized bowel gas pattern is grossly unremarkable. No free
intra-abdominal air is seen, though evaluation for free air is
limited on a single supine view. The visualized lung bases are
grossly clear. Mild degenerative change is noted along the lumbar
spine.
IMPRESSION: Enteric tube noted ending overlying the antrum of the stomach.
